# Patient Record
Sex: Female | Born: 1960
Health system: Southern US, Community
[De-identification: ages and names within clinical notes are randomized; demographics above are authoritative.]

## PROBLEM LIST (undated history)

## (undated) DIAGNOSIS — F419 Anxiety disorder, unspecified: Secondary | ICD-10-CM

## (undated) DIAGNOSIS — F32A Depression, unspecified: Secondary | ICD-10-CM

## (undated) DIAGNOSIS — O021 Missed abortion: Secondary | ICD-10-CM

## (undated) DIAGNOSIS — R0600 Dyspnea, unspecified: Secondary | ICD-10-CM

## (undated) DIAGNOSIS — E785 Hyperlipidemia, unspecified: Secondary | ICD-10-CM

## (undated) DIAGNOSIS — Z8489 Family history of other specified conditions: Secondary | ICD-10-CM

## (undated) DIAGNOSIS — T7840XA Allergy, unspecified, initial encounter: Secondary | ICD-10-CM

## (undated) DIAGNOSIS — I10 Essential (primary) hypertension: Secondary | ICD-10-CM

## (undated) DIAGNOSIS — M1990 Unspecified osteoarthritis, unspecified site: Secondary | ICD-10-CM

## (undated) DIAGNOSIS — Z9289 Personal history of other medical treatment: Secondary | ICD-10-CM

## (undated) DIAGNOSIS — D649 Anemia, unspecified: Secondary | ICD-10-CM

## (undated) HISTORY — PX: DILATION AND CURETTAGE, DIAGNOSTIC / THERAPEUTIC: SUR384

## (undated) HISTORY — PX: CERVIX LESION DESTRUCTION: SHX591

## (undated) HISTORY — DX: Depression, unspecified: F32.A

## (undated) HISTORY — DX: Unspecified osteoarthritis, unspecified site: M19.90

## (undated) HISTORY — DX: Allergy, unspecified, initial encounter: T78.40XA

## (undated) HISTORY — PX: TUBAL LIGATION: SHX77

---

## 2014-04-27 ENCOUNTER — Emergency Department (HOSPITAL_COMMUNITY)
Admission: EM | Admit: 2014-04-27 | Discharge: 2014-04-27 | Disposition: A | Discharge status: Home / Self Care | Payer: Self-pay | Attending: Emergency Medicine | Admitting: Emergency Medicine

## 2014-04-27 ENCOUNTER — Emergency Department (HOSPITAL_COMMUNITY): Payer: Self-pay

## 2014-04-27 ENCOUNTER — Encounter (HOSPITAL_COMMUNITY): Payer: Self-pay | Admitting: *Deleted

## 2014-04-27 DIAGNOSIS — I10 Essential (primary) hypertension: Secondary | ICD-10-CM | POA: Insufficient documentation

## 2014-04-27 DIAGNOSIS — R079 Chest pain, unspecified: Secondary | ICD-10-CM | POA: Insufficient documentation

## 2014-04-27 HISTORY — DX: Essential (primary) hypertension: I10

## 2014-04-27 LAB — COMPREHENSIVE METABOLIC PANEL
ALT: 11 U/L (ref 0–35)
ANION GAP: 6 (ref 5–15)
AST: 17 U/L (ref 0–37)
Albumin: 3.4 g/dL — ABNORMAL LOW (ref 3.5–5.2)
Alkaline Phosphatase: 41 U/L (ref 39–117)
BUN: 7 mg/dL (ref 6–23)
CO2: 26 mmol/L (ref 19–32)
Calcium: 8.6 mg/dL (ref 8.4–10.5)
Chloride: 106 mmol/L (ref 96–112)
Creatinine, Ser: 0.65 mg/dL (ref 0.50–1.10)
GFR calc non Af Amer: >90 mL/min (ref >90)
Glucose, Bld: 103 mg/dL — ABNORMAL HIGH (ref 70–99)
POTASSIUM: 3.5 mmol/L (ref 3.5–5.1)
Sodium: 138 mmol/L (ref 135–145)
Total Bilirubin: 0.4 mg/dL (ref 0.3–1.2)
Total Protein: 6.7 g/dL (ref 6.0–8.3)

## 2014-04-27 LAB — POC OCCULT BLOOD, ED: Fecal Occult Bld: NEGATIVE

## 2014-04-27 LAB — CBC
HCT: 25.7 % — ABNORMAL LOW (ref 36.0–46.0)
HEMOGLOBIN: 7.5 g/dL — AB (ref 12.0–15.0)
MCH: 21.4 pg — ABNORMAL LOW (ref 26.0–34.0)
MCHC: 29.2 g/dL — ABNORMAL LOW (ref 30.0–36.0)
MCV: 73.2 fL — AB (ref 78.0–100.0)
Platelets: 317 10*3/uL (ref 150–400)
RBC: 3.51 MIL/uL — AB (ref 3.87–5.11)
RDW: 16.4 % — ABNORMAL HIGH (ref 11.5–15.5)
WBC: 5.2 10*3/uL (ref 4.0–10.5)

## 2014-04-27 LAB — I-STAT TROPONIN, ED
TROPONIN I, POC: 0 ng/mL (ref 0.00–0.08)
Troponin i, poc: 0 ng/mL (ref 0.00–0.08)

## 2014-04-27 MED ORDER — SODIUM CHLORIDE 0.9 % IV BOLUS (SEPSIS)
1000.0000 mL | Freq: Once | INTRAVENOUS | Status: AC
  Administered 2014-04-27: 1000 mL via INTRAVENOUS

## 2014-04-27 MED ORDER — IOHEXOL 350 MG/ML SOLN
80.0000 mL | Freq: Once | INTRAVENOUS | Status: AC | PRN
  Administered 2014-04-27: 66 mL via INTRAVENOUS

## 2014-04-27 NOTE — ED Provider Notes (Addendum)
CSN: [id number]     Arrival date & time 04/27/14  0158 History   First MD Initiated Contact with Patient 04/27/14 0214     This chart was scribed for Nina Balls, MD by Forrestine Him, ED Scribe. This patient was seen in room D35C/D35C and the patient's care was started 2:23 AM.   Chief Complaint  Patient presents with  . Chest Pain    HPI  HPI Comments: Nina Hopkins brought in by EMS is a 54 y.o. female with a PMHx of HTN who presents to the Emergency Department complaining of constant, moderate central chest pain that occasionally radiates to the back and woke her from sleep this evening. Pain is described as "it just hurts". Pt states pain was worsened with deep breathing but says currently pain is only aggravated with palpation. No medication or home remedies attempted prior to arrival. However, pt was given Nitro and Aspirin en route without any improvement for discomfort. Ms. Dombeck also reports intermittent L ankle swelling. No SOB, vomiting, or diaphoresis. No recent long distance travel. No prior history of chest pain. No hormone use at this time. However, she reports a history of blood clots in her legs several years ago. Pt was prescribed every day medication to manage history of HTN 6 months ago. However, she has been without this medication for past 3 months as she has not contacted her PCP for a refill.  Past Medical History  Diagnosis Date  . Hypertension    Past Surgical History  Procedure Laterality Date  . Tubal ligation     History reviewed. No pertinent family history. History  Substance Use Topics  . Smoking status: Never Smoker   . Smokeless tobacco: Not on file  . Alcohol Use: Yes   OB History    No data available     Review of Systems  A complete 10 system review of systems was obtained and all systems are negative except as noted in the HPI and PMH.    Allergies  Prilosec  Home Medications   Prior to Admission medications   Not on File    Triage Vitals: BP 144/89 mmHg  Pulse 76  Temp(Src) 98.4 F (36.9 C) (Oral)  Resp 16  Ht 5\' 1"  (1.549 m)  Wt 172 lb (78.019 kg)  BMI 32.52 kg/m2  SpO2 100%  LMP 04/18/2014   Physical Exam  Constitutional: She is oriented to person, place, and time. She appears well-developed and well-nourished. No distress.  HENT:  Head: Normocephalic and atraumatic.  Eyes: EOM are normal.  Neck: Normal range of motion.  Cardiovascular: Normal rate, regular rhythm and normal heart sounds.   Pulmonary/Chest: Effort normal and breath sounds normal.  Abdominal: Soft. She exhibits no distension. There is no tenderness.  Musculoskeletal: Normal range of motion.  Neurological: She is alert and oriented to person, place, and time.  Skin: Skin is warm and dry.  Psychiatric: She has a normal mood and affect. Judgment normal.  Nursing note and vitals reviewed.   ED Course  Procedures (including critical care time)  DIAGNOSTIC STUDIES: Oxygen Saturation is 100% on RA, Normal by my interpretation.    COORDINATION OF CARE: 2:37 AM-Discussed treatment plan with pt at bedside and pt agreed to plan.  a   Labs Review Labs Reviewed  CBC - Abnormal; Notable for the following:    RBC 3.51 (*)    Hemoglobin 7.5 (*)    HCT 25.7 (*)    MCV 73.2 (*)  MCH 21.4 (*)    MCHC 29.2 (*)    RDW 16.4 (*)    All other components within normal limits  COMPREHENSIVE METABOLIC PANEL - Abnormal; Notable for the following:    Glucose, Bld 103 (*)    Albumin 3.4 (*)    All other components within normal limits  I-STAT TROPOININ, ED  POC OCCULT BLOOD, ED  I-STAT TROPOININ, ED    Imaging Review Ct Angio Chest Pe W/cm &/or Wo Cm  04/27/2014   CLINICAL DATA:  Central chest pain radiating into the back. Chest pain awoke patient from sleep.  EXAM: CT ANGIOGRAPHY CHEST WITH CONTRAST  TECHNIQUE: Multidetector CT imaging of the chest was performed using the standard protocol during bolus administration of intravenous  contrast. Multiplanar CT image reconstructions and MIPs were obtained to evaluate the vascular anatomy.  CONTRAST:  18mL OMNIPAQUE IOHEXOL 350 MG/ML SOLN  COMPARISON:  None.  FINDINGS: There are no filling defects within the pulmonary arteries to suggest pulmonary embolus.  Mild ectasia of the ascending thoracic aorta without aneurysm. There is tortuosity of the descending aorta. Minimal atherosclerosis of the aortic arch. Mild multi chamber cardiomegaly. No significant coronary artery calcifications. No pleural or pericardial effusion. No mediastinal or hilar adenopathy.  Minimal linear atelectasis in the right middle lobe. No consolidation, pulmonary nodule or mass.  No acute abnormality in the upper abdomen. There is a 12 mm hypodensity in the subcapsular right hepatic lobe, incompletely characterized.  There is mild degenerative change in the mid thoracic spine. No acute or suspicious osseous abnormality.  Review of the MIP images confirms the above findings.  IMPRESSION: 1. No pulmonary embolus. 2. Mild cardiomegaly. Mild tortuosity of the thoracic aorta without aneurysm. 3. No acute intrathoracic process.   Electronically Signed   By: Jeb Levering M.D.   On: 04/27/2014 03:26     EKG Interpretation   Date/Time:  Thursday April 27 2014 02:03:20 EDT Ventricular Rate:  78 PR Interval:  179 QRS Duration: 86 QT Interval:  387 QTC Calculation: 441 R Axis:   26 Text Interpretation:  Sinus rhythm Borderline repolarization abnormality  poor baseline Confirmed by Glynn Octave [phone]) on 04/27/2014  2:49:14 AM      MDM   Final diagnoses:  None    Patient presents for sudden onset CP that woke her out of sleep.  I have concern for PE as her pain is pleuritic and patient has a h/o DVT in the past.    CT is neg for PE.  ACS is low on DDx but will still evaluate as she has risk factors.  HEART score is 3, and 2 sets of trop are neg.  Her Hgb is 7.5 but I do not think anemia is a cause  for her chest pain. She recently went off of her cycle and states she has had anemia for quite some time.  She has been off of her iron pills for a number of years, thus she probably lives in this range.  Hemocult is negative.  Patient has been monitored all night and her VS remain within her normal limits, she is safe for DC with PCP fu within 3 days.    I personally performed the services described in this documentation, which was scribed in my presence. The recorded information has been reviewed and is accurate.   Nina Balls, MD 04/27/14 7425  Nina Balls, MD 04/27/14 9563

## 2014-04-27 NOTE — ED Notes (Signed)
Dr. Claudine Mouton at the bedside to update the patient.

## 2014-04-27 NOTE — ED Notes (Signed)
Pt c/o central chest pain; unable to describe pain. Pain is reproduced on palpation. Reports being awaken by pain. Nitro was given by EMS without relief. Denies recent travel; pain worsens on inspiration.

## 2014-04-27 NOTE — ED Notes (Signed)
Pt to ED from home via [facility] c/o central chest pain radiating iinto back. Chest pain woke pt from sleep. Pain increases on inspiration and is reproducable on palpation. EMS gave 324mg  ASA and nitro x 2 without relief. EMS vs 141/95; hr 83; oxygen sat 98% on room air

## 2014-04-27 NOTE — Discharge Instructions (Signed)
Chest Pain (Nonspecific) Nina Hopkins, your CT scan does not show a blood clot and your EKG and blood work were negative 2 times for a heart attack.  Follow up with a primary physician within 3 days for continued care.  If symptoms worsen, come back to the ED immediately.  Thank you. It is often hard to give a diagnosis for the cause of chest pain. There is always a chance that your pain could be related to something serious, such as a heart attack or a blood clot in the lungs. You need to follow up with your doctor. HOME CARE  If antibiotic medicine was given, take it as directed by your doctor. Finish the medicine even if you start to feel better.  For the next few days, avoid activities that bring on chest pain. Continue physical activities as told by your doctor.  Do not use any tobacco products. This includes cigarettes, chewing tobacco, and e-cigarettes.  Avoid drinking alcohol.  Only take medicine as told by your doctor.  Follow your doctor's suggestions for more testing if your chest pain does not go away.  Keep all doctor visits you made. GET HELP IF:  Your chest pain does not go away, even after treatment.  You have a rash with blisters on your chest.  You have a fever. GET HELP RIGHT AWAY IF:   You have more pain or pain that spreads to your arm, neck, jaw, back, or belly (abdomen).  You have shortness of breath.  You cough more than usual or cough up blood.  You have very bad back or belly pain.  You feel sick to your stomach (nauseous) or throw up (vomit).  You have very bad weakness.  You pass out (faint).  You have chills. This is an emergency. Do not wait to see if the problems will go away. Call your local emergency services (911 in [place] Do not drive yourself to the hospital. MAKE SURE YOU:   Understand these instructions.  Will watch your condition.  Will get help right away if you are not doing well or get worse. Document Released: 07/09/2007  Document Revised: 01/25/2013 Document Reviewed: 07/09/2007 [facility] Patient Information 2015 [place], [place]. This information is not intended to replace advice given to you by your health care provider. Make sure you discuss any questions you have with your health care provider.

## 2014-07-09 ENCOUNTER — Emergency Department (HOSPITAL_COMMUNITY)
Admission: EM | Admit: 2014-07-09 | Discharge: 2014-07-09 | Disposition: A | Discharge status: Home / Self Care | Payer: Self-pay | Attending: Emergency Medicine | Admitting: Emergency Medicine

## 2014-07-09 ENCOUNTER — Encounter (HOSPITAL_COMMUNITY): Payer: Self-pay | Admitting: *Deleted

## 2014-07-09 ENCOUNTER — Emergency Department (HOSPITAL_COMMUNITY): Payer: Self-pay

## 2014-07-09 DIAGNOSIS — J209 Acute bronchitis, unspecified: Secondary | ICD-10-CM | POA: Insufficient documentation

## 2014-07-09 DIAGNOSIS — R590 Localized enlarged lymph nodes: Secondary | ICD-10-CM | POA: Insufficient documentation

## 2014-07-09 DIAGNOSIS — J40 Bronchitis, not specified as acute or chronic: Secondary | ICD-10-CM

## 2014-07-09 DIAGNOSIS — I10 Essential (primary) hypertension: Secondary | ICD-10-CM | POA: Insufficient documentation

## 2014-07-09 MED ORDER — AZITHROMYCIN 250 MG PO TABS
250.0000 mg | ORAL_TABLET | Freq: Every day | ORAL | Status: DC
Start: 2014-07-09 — End: 2014-12-21

## 2014-07-09 MED ORDER — ALBUTEROL SULFATE HFA 108 (90 BASE) MCG/ACT IN AERS
2.0000 | INHALATION_SPRAY | Freq: Once | RESPIRATORY_TRACT | Status: AC
  Administered 2014-07-09: 2 via RESPIRATORY_TRACT
  Filled 2014-07-09: qty 6.7

## 2014-07-09 MED ORDER — HYDROCHLOROTHIAZIDE 25 MG PO TABS: 25.0000 mg | ORAL_TABLET | Freq: Every day | ORAL | Status: DC

## 2014-07-09 NOTE — ED Provider Notes (Signed)
CSN: [id number]     Arrival date & time 07/09/14  1027 History  This chart was scribed for non-physician practitioner, Al Corpus, PA-C, working with Lacretia Leigh, MD, by Stephania Fragmin, ED Scribe. This patient was seen in room TR09C/TR09C and the patient's care was started at 12:12 PM.    Chief Complaint  Patient presents with  . Cough   The history is provided by the patient. No language interpreter was used.   HPI Comments: Nina Hopkins is a 54 y.o. female who presents to the Emergency Department complaining of a constant, severe cough that was initially productive with thick green sputum but recently thinned out to yellow-green sputum, with onset 5 days ago. She states she also had fever of 101 and generalized myalgias for the first two days that her symptoms began, but they resolved 3 days ago. She complains of associated rib pain secondary to her cough, exacerbated by deep inspiration. No exertional symptoms. She tried taking Tylenol and ibuprofen the first 2 days for her myalgias, which provided temporary relief. She hasn't taken anything else for her symptoms, as she has a history of hypertension and wasn't sure what OTC medication was safe to take. She denies a history of asthma or COPD. She also denies a history of smoking. She denies nausea or vomiting.   Past Medical History  Diagnosis Date  . Hypertension    Past Surgical History  Procedure Laterality Date  . Tubal ligation     No family history on file. History  Substance Use Topics  . Smoking status: Never Smoker   . Smokeless tobacco: Not on file  . Alcohol Use: Yes   OB History    No data available     Review of Systems  Respiratory: Positive for cough. Negative for wheezing.   Cardiovascular: Positive for chest pain.  Gastrointestinal: Negative for nausea and vomiting.  Musculoskeletal: Positive for myalgias. Negative for gait problem.    Allergies  Prilosec  Home Medications   Prior to Admission  medications   Medication Sig Start Date End Date Taking? Authorizing Provider  azithromycin (ZITHROMAX) 250 MG tablet Take 1 tablet (250 mg total) by mouth daily. Take first 2 tablets together, then 1 every day until finished. 07/09/14   Al Corpus, PA-C  hydrochlorothiazide (HYDRODIURIL) 25 MG tablet Take 1 tablet (25 mg total) by mouth daily. 07/09/14   Al Corpus, PA-C   BP 173/110 mmHg  Pulse 99  Temp(Src) 99.3 F (37.4 C) (Oral)  Resp 12  SpO2 100%  LMP 07/06/2014 Physical Exam  Constitutional: She appears well-developed and well-nourished. No distress.  HENT:  Head: Normocephalic and atraumatic.  Nose: Right sinus exhibits no maxillary sinus tenderness and no frontal sinus tenderness. Left sinus exhibits no maxillary sinus tenderness and no frontal sinus tenderness.  Mouth/Throat: Mucous membranes are normal. Posterior oropharyngeal edema and posterior oropharyngeal erythema present. No oropharyngeal exudate.  Oropharyngeal swelling, but no significant exudates.   Eyes: Conjunctivae and EOM are normal. Right eye exhibits no discharge. Left eye exhibits no discharge.  Neck: Normal range of motion. Neck supple.  Cardiovascular: Normal rate, regular rhythm and normal heart sounds.   Pulmonary/Chest: Effort normal and breath sounds normal. No respiratory distress. She has no wheezes. She has no rales.  Lungs are clear to auscultation.   Abdominal: Soft. Bowel sounds are normal. She exhibits no distension. There is no tenderness.  Lymphadenopathy:    She has cervical adenopathy.  Neurological: She is alert.  Skin: Skin is warm  and dry. She is not diaphoretic.  Nursing note and vitals reviewed.   ED Course  Procedures (including critical care time)  DIAGNOSTIC STUDIES: Oxygen Saturation is 99% on RA, normal by my interpretation.    COORDINATION OF CARE: 12:16 PM - Discussed treatment plan with pt at bedside which includes CXR and inhaler, and pt agreed to plan.  Imaging  Review Dg Chest 2 View  07/09/2014   CLINICAL DATA:  Cough for 5 days, hypertension  EXAM: CHEST  2 VIEW  COMPARISON:  None; correlation CT chest 04/27/2014  FINDINGS: Upper normal heart size.  Mediastinal contours and pulmonary vascularity normal.  Minimal peribronchial thickening.  Lungs clear.  No pleural effusion or pneumothorax.  Osseous structures unremarkable.  IMPRESSION: Minimal bronchitic changes without infiltrate.   Electronically Signed   By: Lavonia Dana M.D.   On: 07/09/2014 13:44     MDM   Final diagnoses:  Essential hypertension  Bronchitis   Pt presenting with cough with fevers. CXR negative for PNA. Bronchitis changes. Will treat with albuterol inhaler, azithromycin. Pt in no respiratory distress. Pt with hypertension without exertional chest pain, headache visual changes, hematuria. Pt did not take her hypertension medications. Will restart HCTZ which she has taken in the past. Review of basic labs in March with good kidney function. Pt to take when she gets home and follow up with the wellness center or PCP of her choice. ED resources provided. No sign of hypertensive urgency.   Discussed return precautions with patient. Discussed all results and patient verbalizes understanding and agrees with plan.  I personally performed the services described in this documentation, which was scribed in my presence. The recorded information has been reviewed and is accurate.   Al Corpus, PA-C 07/09/14 1457  Lacretia Leigh, MD 07/11/14 1135

## 2014-07-09 NOTE — ED Notes (Signed)
Declined W/C at D/C and was escorted to lobby by RN. 

## 2014-07-09 NOTE — Discharge Instructions (Signed)
Return to the emergency room with worsening of symptoms, new symptoms or with symptoms that are concerning , especially fevers, stiff neck, worsening headache, nausea/vomiting, visual changes or slurred speech, chest pain, shortness of breath, cough with thick colored mucous or blood OR headache, visual changes, blood in urine.  Start taking claritin, zyrtec or allegra. Please take all of your antibiotics until finished!   You may develop abdominal discomfort or diarrhea from the antibiotic.  You may help offset this with probiotics which you can buy or get in yogurt. Do not eat  or take the probiotics until 2 hours after your antibiotic.  Please call your doctor for a followup appointment within 24-48 hours. When you talk to your doctor please let them know that you were seen in the emergency department and have them acquire all of your records so that they can discuss the findings with you and formulate a treatment plan to fully care for your new and ongoing problems. If you do not have a primary care provider please call the number below under ED resources to establish care with a provider and follow up.    Emergency Department Resource Guide 1) Find a Doctor and Pay Out of Pocket Although you won't have to find out who is covered by your insurance plan, it is a good idea to ask around and get recommendations. You will then need to call the office and see if the doctor you have chosen will accept you as a new patient and what types of options they offer for patients who are self-pay. Some doctors offer discounts or will set up payment plans for their patients who do not have insurance, but you will need to ask so you aren't surprised when you get to your appointment.  2) Contact Your Local Health Department Not all health departments have doctors that can see patients for sick visits, but many do, so it is worth a call to see if yours does. If you don't know where your local health department is, you  can check in your phone book. The CDC also has a tool to help you locate your state's health department, and many state websites also have listings of all of their local health departments.  3) Find a [facility] If your illness is not likely to be very severe or complicated, you may want to try a walk in clinic. These are popping up all over the country in pharmacies, drugstores, and shopping centers. They're usually staffed by nurse practitioners or physician assistants that have been trained to treat common illnesses and complaints. They're usually fairly quick and inexpensive. However, if you have serious medical issues or chronic medical problems, these are probably not your best option.  No Primary Care Doctor: - Call Health Connect at  [phone] - they can help you locate a primary care doctor that  accepts your insurance, provides certain services, etc. - Physician Referral Service- [phone]  Chronic Pain Problems: Organization         Address  Phone   Notes  [facility]  [phone] Patients need to be referred by their primary care doctor.   Medication Assistance: Organization         Address  Phone   Notes  [facility] Medication [facility] [address] [place], [place] [address] [phone] --Must be a resident of [facility] -- Must have NO insurance coverage whatsoever (no Medicaid/ Medicare, etc.) -- The pt. MUST have  a primary care doctor that directs their care regularly and follows them in the community   MedAssist  [phone]   [employer]  [phone]    Agencies that provide inexpensive medical care: Organization         Address  Phone   Notes  [place]  [phone]   [facility]    [phone]   [facility] [place], [place] [address] [phone]   [place] [address], [place] [phone]   Planned Parenthood    [phone]   [facility]    [phone]   [place] and [place] [address], [place] Phone:  [phone], Fax:  [phone] Hours of Operation:  9 am - 6 pm, M-F.  Also accepts Medicaid/Medicare and self-pay.  [facility] for [address], [place] Phone: [phone], Fax: [phone]. Hours of Operation:  8:30 am - 5:30 pm, M-F.  Also accepts Medicaid and self-pay.  [facility] [place] [address] [place] Phone: [phone]   [place], [place], [place] [phone], Ext. [phone] Mondays & Thursdays: 7-9 AM.  First 15 patients are seen on a first come, first serve basis.    [place] Providers:  Organization         Address  Phone   Notes  [facility] [address], [place] [phone] Also accepts self-pay patients.  [facility] [address], [place]  [phone]   [place], [address], [place] [phone]   [facility] [address] [place] [phone]   Lucianne Lei [address], [place]   [phone] Only accepts [place] Access [place] patients after they have their name applied to their card.   Self-Pay (no insurance) in [facility]:  Organization         Address  Phone   Notes  Sickle Cell Patients, [facility] [place] [phone]   [facility] Urgent Care [place] [phone]   [facility] [place]  [place], [place], [place] [phone]   [facility]/Dr. Osei-Bonsu  [address], [place] or [address], [place] [phone] Phone number for both [place] and [place] locations  is the same.  Urgent Medical and [facility] [address], [place] [phone]   [facility] [address], [place] or [address] [phone]   [facility] [place] [address] [place] [phone], phone; [phone], fax Sees patients 1st and 3rd Saturday of every month.  Must not qualify for public or private insurance (i.e. Medicaid, Medicare, [place] Health Choice, Veterans' Benefits)  Household income should be no more than 200% of the poverty level The clinic cannot treat you if you are pregnant or think you are pregnant  Sexually transmitted diseases are not treated at the clinic.    Dental Care: Organization         Address  Phone  Notes  [facility] [address] [place], [place] (  [phone] Accepts children up to age 52 who are enrolled in Medicaid or [place]; pregnant women with a Medicaid card; and children who have applied for Medicaid or [place] Health Choice, but were declined, whose parents can pay a reduced fee at time of service.  [facility] Department of [facility]  [address], [place] [phone] Accepts children up to age 15 who are enrolled in [place] or [place]; pregnant women with a Medicaid card; and children who have applied for Medicaid or [place] Health Choice, but were declined, whose parents can pay a reduced fee at time of service.  [place] Adult Dental Access PROGRAM  [place] [phone] Patients are seen by appointment only. Walk-ins are not accepted. [place] will see patients 83 years of age and older. Monday - Tuesday (8am-5pm) Most Wednesdays (8:30-5pm) $30 per visit, cash only  [facility] Adult Dental Access PROGRAM  [address], [facility] [phone] Patients are seen by appointment only. Walk-ins are not accepted. [place] will see  patients 47 years of age and older. One Wednesday Evening (Monthly: Volunteer Based).  $30 per visit, cash only  [place]  [phone] for adults; Children under age 69, call Graduate Pediatric Dentistry at [phone]. Children aged 85-14, please call [phone] to request a pediatric application.  Dental services are provided in all areas of dental care including fillings, crowns and bridges, complete and partial dentures, implants, gum treatment, root canals, and extractions. Preventive care is also provided. Treatment is provided to both adults and children. Patients are selected via a lottery and there is often a waiting list.   [facility] [address], [place]  [phone] [email or web address]   [facility] [address] [place], [place] [phone], Ext. [phone] Second and Fourth Thursday of each month, opens at 6:30 AM; Clinic ends at 9 AM.  Patients are seen on a first-come first-served basis, and a limited number are seen during each clinic.   [facility]  [address] [place], [place] [phone]   Eligibility Requirements You must have lived in [place], [place], or [place] counties for at least the last three months.   You cannot be eligible for state or federal sponsored [employer], including [employer], [place], or [employer].   You generally cannot be eligible for healthcare insurance through your employer.    How to apply: Eligibility screenings are held every Tuesday and Wednesday afternoon from 1:00 pm until 4:00 pm. You do not need an appointment for the interview!  [facility] [address] [place], [place]   [place]  [place] Department  [place]  [phone]    Behavioral Health Resources in the Community: Intensive Outpatient  Programs Organization         Address  Phone  Notes  [place]. [address], [place], [place] [phone]   [facility] Outpatient [address], [place], [place]   ADS: Alcohol & Drug Svcs [address] [place], [place]   [place] [address],  [place], [place] or [phone]   Substance Abuse Resources Organization         Address  Phone  Notes  Alcohol and Drug Services  [place]  [phone]   [place]   Chinita Pester  [phone]   Residential & Outpatient Substance Abuse Program  [phone]   Psychological Services Organization         Address  Phone  Notes  [place]  [place]  [phone]   [place] [address], [place] or [phone]    Mobile Crisis Teams Organization         Address  Phone  Notes  [facility], Mobile Crisis Care Unit  [phone]   [facility] Psychotherapeutic Services  [address] [place], [place]   [place] [address], [place] [phone]    Self-Help/Support Groups Organization         Address  Phone             Notes  [place]. of [place] - variety of support groups  [place] Call for more information  Narcotics Anonymous (NA), [facility] [address], [employer] [place]  2 meetings at this location   Special educational needs teacher         Address  Phone  Notes  [facility] Residential Treatment [place],    [place]  [phone]   [facility]  [address], [place] [phone], [employer], [place]   [place], [place] [phone] Admissions: 8am-3pm M-F  Incentives Substance [place] [address],    [place], [place]  [phone]   [facility] [address] [place], [place], [place]   [facility] [address]  [place], [place]   [facility]ight Programs - Intensive Outpatient [address], [place], [place]   [facility] ([place].) [address].,  [place], [place] [phone] or [phone]   [facility] Services (RTS) [address], [place], [place] Accepts Medicaid  [facility] [place] [address],  [place] [phone] Substance Abuse/Addiction Treatment   [facility] Organization         Address  Phone  Notes  [facility]  [phone]   Domenic Schwab, PhD [address]lis [place], [place]   [phone] or [phone]   [place], [place] [phone]   [facility] [address], [place], [place] [phone] Insurance/Medicaid/sponsorship through [facility] and Families [address] [place]                                    [place], [place] [phone] [place] [address] [place], [place] [phone]    Dr. Adele Schilder  [phone]   [facility] Dept. 1) [address] [place] 2) [place] 3)  [place] [address], [place] [phone]  [phone]   [place] [phone] or [phone] (After Hours)

## 2014-07-09 NOTE — ED Notes (Addendum)
Pt states that she has had a cough and cold symptoms with body aches and soreness. Pt states that she had fever on Tuesday and wednesday but none since. Hx of hypertension not currently taking meds due to insurance.

## 2014-08-11 ENCOUNTER — Ambulatory Visit: Payer: Self-pay

## 2014-12-20 ENCOUNTER — Encounter (HOSPITAL_COMMUNITY): Payer: Self-pay | Admitting: Emergency Medicine

## 2014-12-20 ENCOUNTER — Emergency Department (HOSPITAL_COMMUNITY): Payer: Self-pay

## 2014-12-20 ENCOUNTER — Observation Stay (HOSPITAL_COMMUNITY)
Admission: EM | Admit: 2014-12-20 | Discharge: 2014-12-21 | Disposition: A | Discharge status: Home / Self Care | Payer: Self-pay | Attending: Internal Medicine | Admitting: Internal Medicine

## 2014-12-20 DIAGNOSIS — N921 Excessive and frequent menstruation with irregular cycle: Secondary | ICD-10-CM

## 2014-12-20 DIAGNOSIS — R0600 Dyspnea, unspecified: Secondary | ICD-10-CM | POA: Diagnosis present

## 2014-12-20 DIAGNOSIS — R0602 Shortness of breath: Secondary | ICD-10-CM | POA: Insufficient documentation

## 2014-12-20 DIAGNOSIS — I10 Essential (primary) hypertension: Secondary | ICD-10-CM | POA: Insufficient documentation

## 2014-12-20 DIAGNOSIS — Z9289 Personal history of other medical treatment: Secondary | ICD-10-CM

## 2014-12-20 DIAGNOSIS — D500 Iron deficiency anemia secondary to blood loss (chronic): Principal | ICD-10-CM | POA: Insufficient documentation

## 2014-12-20 DIAGNOSIS — M546 Pain in thoracic spine: Secondary | ICD-10-CM | POA: Diagnosis present

## 2014-12-20 DIAGNOSIS — R0789 Other chest pain: Secondary | ICD-10-CM | POA: Insufficient documentation

## 2014-12-20 DIAGNOSIS — D649 Anemia, unspecified: Secondary | ICD-10-CM | POA: Diagnosis present

## 2014-12-20 DIAGNOSIS — N920 Excessive and frequent menstruation with regular cycle: Secondary | ICD-10-CM | POA: Diagnosis present

## 2014-12-20 DIAGNOSIS — Z888 Allergy status to other drugs, medicaments and biological substances status: Secondary | ICD-10-CM | POA: Insufficient documentation

## 2014-12-20 DIAGNOSIS — R079 Chest pain, unspecified: Secondary | ICD-10-CM

## 2014-12-20 DIAGNOSIS — N939 Abnormal uterine and vaginal bleeding, unspecified: Secondary | ICD-10-CM | POA: Diagnosis present

## 2014-12-20 HISTORY — DX: Anxiety disorder, unspecified: F41.9

## 2014-12-20 HISTORY — DX: Anemia, unspecified: D64.9

## 2014-12-20 HISTORY — DX: Personal history of other medical treatment: Z92.89

## 2014-12-20 HISTORY — DX: Family history of other specified conditions: Z84.89

## 2014-12-20 LAB — CBC
HEMATOCRIT: 21.7 % — AB (ref 36.0–46.0)
HEMOGLOBIN: 6 g/dL — AB (ref 12.0–15.0)
MCH: 18.8 pg — ABNORMAL LOW (ref 26.0–34.0)
MCHC: 27.6 g/dL — ABNORMAL LOW (ref 30.0–36.0)
MCV: 67.8 fL — AB (ref 78.0–100.0)
PLATELETS: 333 10*3/uL (ref 150–400)
RBC: 3.2 MIL/uL — AB (ref 3.87–5.11)
RDW: 17.2 % — ABNORMAL HIGH (ref 11.5–15.5)
WBC: 4.7 10*3/uL (ref 4.0–10.5)

## 2014-12-20 LAB — BASIC METABOLIC PANEL
Anion gap: 7 (ref 5–15)
BUN: 7 mg/dL (ref 6–20)
CHLORIDE: 107 mmol/L (ref 101–111)
CO2: 22 mmol/L (ref 22–32)
Calcium: 9 mg/dL (ref 8.9–10.3)
Creatinine, Ser: 0.68 mg/dL (ref 0.44–1.00)
GFR calc non Af Amer: >60 mL/min (ref >60)
Glucose, Bld: 93 mg/dL (ref 65–99)
POTASSIUM: 4.1 mmol/L (ref 3.5–5.1)
Sodium: 136 mmol/L (ref 135–145)

## 2014-12-20 LAB — FOLATE: Folate: 15.9 ng/mL (ref >5.9)

## 2014-12-20 LAB — PROTIME-INR
INR: 1.35 (ref 0.00–1.49)
Prothrombin Time: 16.8 seconds — ABNORMAL HIGH (ref 11.6–15.2)

## 2014-12-20 LAB — RETICULOCYTES
RBC.: 3.41 MIL/uL — ABNORMAL LOW (ref 3.87–5.11)
RETIC CT PCT: 1.4 % (ref 0.4–3.1)
Retic Count, Absolute: 47.7 10*3/uL (ref 19.0–186.0)

## 2014-12-20 LAB — IRON AND TIBC
IRON: 9 ug/dL — AB (ref 28–170)
SATURATION RATIOS: 2 % — AB (ref 10.4–31.8)
TIBC: 540 ug/dL — AB (ref 250–450)
UIBC: 531 ug/dL

## 2014-12-20 LAB — ABO/RH: ABO/RH(D): O POS

## 2014-12-20 LAB — TROPONIN I: Troponin I: <0.03 ng/mL (ref <0.031)

## 2014-12-20 LAB — I-STAT TROPONIN, ED: TROPONIN I, POC: 0 ng/mL (ref 0.00–0.08)

## 2014-12-20 LAB — VITAMIN B12: VITAMIN B 12: 438 pg/mL (ref 180–914)

## 2014-12-20 LAB — FERRITIN: FERRITIN: 2 ng/mL — AB (ref 11–307)

## 2014-12-20 LAB — PREPARE RBC (CROSSMATCH)

## 2014-12-20 MED ORDER — HYDROMORPHONE HCL 1 MG/ML IJ SOLN: 1.0000 mg | INTRAMUSCULAR | Status: DC | PRN

## 2014-12-20 MED ORDER — ACETAMINOPHEN 650 MG RE SUPP: 650.0000 mg | Freq: Four times a day (QID) | RECTAL | Status: DC | PRN

## 2014-12-20 MED ORDER — MORPHINE SULFATE (PF) 4 MG/ML IV SOLN
4.0000 mg | Freq: Once | INTRAVENOUS | Status: AC
  Administered 2014-12-20: 4 mg via INTRAVENOUS
  Filled 2014-12-20: qty 1

## 2014-12-20 MED ORDER — ACETAMINOPHEN 325 MG PO TABS
650.0000 mg | ORAL_TABLET | Freq: Four times a day (QID) | ORAL | Status: DC | PRN
  Administered 2014-12-21: 650 mg via ORAL
  Filled 2014-12-20: qty 2

## 2014-12-20 MED ORDER — FERROUS GLUCONATE 324 (38 FE) MG PO TABS
324.0000 mg | ORAL_TABLET | Freq: Three times a day (TID) | ORAL | Status: DC
  Administered 2014-12-20 – 2014-12-21 (×3): 324 mg via ORAL
  Filled 2014-12-20 (×3): qty 1

## 2014-12-20 MED ORDER — ONDANSETRON HCL 4 MG/2ML IJ SOLN
4.0000 mg | Freq: Once | INTRAMUSCULAR | Status: AC
  Administered 2014-12-20: 4 mg via INTRAVENOUS
  Filled 2014-12-20: qty 2

## 2014-12-20 MED ORDER — ENOXAPARIN SODIUM 40 MG/0.4ML ~~LOC~~ SOLN
40.0000 mg | SUBCUTANEOUS | Status: DC
  Administered 2014-12-20: 40 mg via SUBCUTANEOUS
  Filled 2014-12-20: qty 0.4

## 2014-12-20 MED ORDER — ONDANSETRON HCL 4 MG/2ML IJ SOLN: 4.0000 mg | Freq: Four times a day (QID) | INTRAMUSCULAR | Status: DC | PRN

## 2014-12-20 MED ORDER — ONDANSETRON HCL 4 MG/2ML IJ SOLN: 4.0000 mg | Freq: Three times a day (TID) | INTRAMUSCULAR | Status: DC | PRN

## 2014-12-20 MED ORDER — HYDROCHLOROTHIAZIDE 25 MG PO TABS
25.0000 mg | ORAL_TABLET | Freq: Every day | ORAL | Status: DC
  Administered 2014-12-20 – 2014-12-21 (×2): 25 mg via ORAL
  Filled 2014-12-20 (×3): qty 1

## 2014-12-20 MED ORDER — SODIUM CHLORIDE 0.9 % IV SOLN
Freq: Once | INTRAVENOUS | Status: AC
  Administered 2014-12-20: 16:00:00 via INTRAVENOUS

## 2014-12-20 MED ORDER — ONDANSETRON HCL 4 MG PO TABS: 4.0000 mg | ORAL_TABLET | Freq: Four times a day (QID) | ORAL | Status: DC | PRN

## 2014-12-20 MED ORDER — ALUM & MAG HYDROXIDE-SIMETH 200-200-20 MG/5ML PO SUSP: 30.0000 mL | Freq: Four times a day (QID) | ORAL | Status: DC | PRN

## 2014-12-20 MED ORDER — SENNOSIDES-DOCUSATE SODIUM 8.6-50 MG PO TABS
1.0000 | ORAL_TABLET | Freq: Every evening | ORAL | Status: DC | PRN
Start: 2014-12-20 — End: 2014-12-21

## 2014-12-20 NOTE — Discharge Planning (Signed)
Spoke to patient regarding primary care resources and the [facility] orange card. Follow up appointment made with [facility] for Monday Dec 5,2016 at 2:00pm, pt verbalized understanding of the upcoming appointment. Orange card application provided and explained, pt instructed to contact me once application is complete. My contact information also provided for any future questions or concerns. No other [place] Specialist needs identified at this time.   [place] Specialist Partnership for [facility] [phone]

## 2014-12-20 NOTE — H&P (Signed)
[facility] History and Physical  Nina Hopkins [id number] DOB: 1960-07-13 DOA: 12/20/2014  Referring physician: [email or web address] PCP: No primary care provider on file.   Chief Complaint: sob back pain  HPI: Nina Hopkins is a very pleasant 54 y.o. female with a past medical history that includes hypertension presents to the emergency department with the chief complaint of shortness of breath and mid thoracic back pain. Initial evaluation reveals a hemoglobin of 6.0 and hypertension.  Patient reports gradual worsening of shortness of breath with exertion over the last couple of months. Last 2 days has progressed to shortness of breath at rest. Associated symptoms include some midthoracic back pain. She describes the pain as sharp radiating to her shoulder. She says movement makes it worse and lying still makes it better. She denies headache dizziness syncope or near-syncope. She denies cough chest pain palpitations abdominal pain nausea vomiting. She denies any diaphoresis dysuria hematuria frequency or urgency. She denies any constipation diarrhea melena or hematochezia. He denies any history of DVT or PE. She denies any recent travel or lower extremity swelling or pain. She does report having heavy menses for the last 6-8 months. She reports having a heavy period every month and then about half of the time she'll have an extra full cycle. Ports going through 8-10 pads per day during her menses. She denies abdominal cramping but does endorse a few clots. No aspirin or NSAID use  Workup in the emergency department reveals hemoglobin is 6.0 hematocrit 21.7 MCV [id number], basic metabolic panel unremarkable initial troponin negative. Chest x-ray yields no active disease. EKG normal sinus rhythm.  In the emergency department she's afebrile. He is a blood pressure 173/105 heart rate 78 breast Pers 18 she's not hypoxic  She is given 4 mg of morphine and 4 mg of Zofran Review of Systems:  10 point  review of systems complete and all systems are negative except as indicated in the history of present illness Past Medical History  Diagnosis Date  . Hypertension    Past Surgical History  Procedure Laterality Date  . Tubal ligation     Social History:  reports that she has never smoked. She does not have any smokeless tobacco history on file. She reports that she drinks alcohol. She reports that she does not use illicit drugs. She lives at home. She is currently employed in [employer]. She is independent with ADL Allergies  Allergen Reactions  . Prilosec [Omeprazole]     Stiff neck    No family history on file. mother still alive has a history of hypertension she has 3 siblings collective medical history positive for hypertension she is unsure about her father  Prior to Admission medications   Medication Sig Start Date End Date Taking? Authorizing Provider  ibuprofen (ADVIL,MOTRIN) 200 MG tablet Take 400 mg by mouth every 6 (six) hours as needed.   Yes Historical Provider, MD  azithromycin (ZITHROMAX) 250 MG tablet Take 1 tablet (250 mg total) by mouth daily. Take first 2 tablets together, then 1 every day until finished. Patient not taking: Reported on 12/20/2014 07/09/14   Al Corpus, PA-C  hydrochlorothiazide (HYDRODIURIL) 25 MG tablet Take 1 tablet (25 mg total) by mouth daily. 07/09/14   Al Corpus, PA-C   Physical Exam: Filed Vitals:   12/20/14 1300 12/20/14 1315 12/20/14 1345 12/20/14 1415  BP: 167/99 164/99 164/102 185/88  Pulse:  79 73 89  Temp:      TempSrc:      Resp:  13 13 19   Height:      Weight:      SpO2:  100% 100% 100%    Wt Readings from Last 3 Encounters:  12/20/14 74.39 kg (164 lb)  04/27/14 78.019 kg (172 lb)    General:  Appears calm and only slightly uncomfortable Eyes: PERRL, normal lids, irises & conjunctiva ENT: grossly normal hearing, lips & tongue, because membranes of her mouth are moist and pink Neck: no LAD, masses or  thyromegaly Cardiovascular: RRR, no m/r/g. No LE edema. Pulses present and palpable Respiratory: CTA bilaterally, no w/r/r. Normal respiratory effort. Abdomen: soft, ntnd positive bowel sounds throughout no guarding or rebounding Skin: no rash or induration seen on limited exam Musculoskeletal: grossly normal tone BUE/BLE Psychiatric: grossly normal mood and affect, speech fluent and appropriate Neurologic: grossly non-focal. Speech clear facial symmetry           Labs on Admission:  Basic Metabolic Panel:  Recent Labs Lab 12/20/14 1204  NA 136  K 4.1  CL 107  CO2 22  GLUCOSE 93  BUN 7  CREATININE 0.68  CALCIUM 9.0   Liver Function Tests: No results for input(s): AST, ALT, ALKPHOS, BILITOT, PROT, ALBUMIN in the last 168 hours. No results for input(s): LIPASE, AMYLASE in the last 168 hours. No results for input(s): AMMONIA in the last 168 hours. CBC:  Recent Labs Lab 12/20/14 1204  WBC 4.7  HGB 6.0*  HCT 21.7*  MCV 67.8*  PLT 333   Cardiac Enzymes: No results for input(s): CKTOTAL, CKMB, CKMBINDEX, TROPONINI in the last 168 hours.  BNP (last 3 results) No results for input(s): BNP in the last 8760 hours.  ProBNP (last 3 results) No results for input(s): PROBNP in the last 8760 hours.  CBG: No results for input(s): GLUCAP in the last 168 hours.  Radiological Exams on Admission: Dg Chest 2 View  12/20/2014  CLINICAL DATA:  Left-sided chest pain for 2 days, no known injury, initial encounter EXAM: CHEST - 2 VIEW COMPARISON:  07/09/2014 FINDINGS: Cardiac shadow is within normal limits. The lungs are clear bilaterally. No acute fracture is noted. IMPRESSION: No active disease. Electronically Signed   By: Inez Catalina M.D.   On: 12/20/2014 12:51    EKG: Independently reviewed as noted above sinus rhythm  Assessment/Plan Active Problems:   Anemia   Dyspnea   HTN (hypertension)   Back pain   Heavy menses  #1. Anemia microcytic. Likely related to heavy  menses and irregular menses. No report of bright red blood per rectum melena. No NSAID use.  -Admit to telemetry -Transfuse 2 units of packed red blood cells -Obtain an anemia panel -Recheck hemoglobin in the morning -Of note hemoglobin 7.5 in March of this year  #2. Dyspnea at rest and on exertion. Chest x-ray clear. EKG within the limits of normal. Initial troponin negative. Likely related to #1 -We'll cycle troponins for completeness. -Oxygen supplementation as indicated -Monitor oxygen saturation level -Obtain orthostatic vital signs  #3. Back pain. Midthoracic. Reproducible. Initial troponin negative. -We'll cycle troponins for completeness. -Analgesia as needed -Much improved at the time of my exam  #4. Hypertension. Uncontrolled -Home medications include hydrochlorothiazide which she has not taken in almost a year. -Blood pressure at the time of my exam 164/102. - Will resume hydrochlorothiazide and monitor closely -She will likely need an additional agent -She will need close outpatient follow-up. She has an appointment December 5  #5. Heavy menses. She reports a very remote history of concern  about uterine fibroids. She states this was worked up and workup was negative. She has 2 children -Currently she has monthly cycles and some months she has an extra full cycle -Consider Megace -She has PCP appointment on December 5. Will need outpatient referral and CBC     Code Status: full DVT Prophylaxis: Family Communication: none present Disposition Plan: home hopefully 24 hours  Time spent: 48 minutes  [facility]

## 2014-12-20 NOTE — ED Notes (Signed)
C/o SOB, back and CP since last night, no other complaints, Alert, ambulatory and in NAD

## 2014-12-20 NOTE — ED Notes (Signed)
5W still unable to take report. Will give bedside report.

## 2014-12-20 NOTE — ED Notes (Signed)
Attempting report to 5W.

## 2014-12-20 NOTE — ED Provider Notes (Signed)
CSN: [id number]     Arrival date & time 12/20/14  1146 History   First MD Initiated Contact with Patient 12/20/14 1213     Chief Complaint  Patient presents with  . Shortness of Breath  . Back Pain     (Consider location/radiation/quality/duration/timing/severity/associated sxs/prior Treatment) HPI  Blood pressure 164/102, pulse 73, temperature 99.1 F (37.3 C), temperature source Oral, resp. rate 13, height 5\' 1"  (1.549 m), weight 164 lb (74.39 kg), SpO2 100 %.  Nina Hopkins is a 54 y.o. female complaining of left-sided scapular pain radiating to the front exacerbated with movement onset several days ago, the pain kept her from sleep last night. She also reports a shortness of breath onset after the chest pain that is exertional. Patient denies diaphoresis, palpitations, syncope, history of DVT or PE, recent immobilizations, calf pain or leg swelling, exogenous estrogen. Patient is not postmenopausal. States that her periods are very heavy. Sometimes she has them twice a month may last for the full 5-7 days. She doesn't follow with OB/GYN. Is told she had fibroids in the remote past but on subsequent ultrasound they had resolved. Patient denies excessive NSAID use. She takes 2 ibuprofen liquid gels twice a day for the past 2 days due to dental pain. Denies excessive NSAID use, nausea, vomiting, melena, hematochezia, diarrhea, history of GI bleed. She states it's been several months since she's had her blood pressure medications due to lack of insurance and outpatient primary care.  Past Medical History  Diagnosis Date  . Hypertension    Past Surgical History  Procedure Laterality Date  . Tubal ligation     No family history on file. Social History  Substance Use Topics  . Smoking status: Never Smoker   . Smokeless tobacco: None  . Alcohol Use: Yes   OB History    No data available     Review of Systems  10 systems reviewed and found to be negative, except as noted in the  HPI.   Allergies  Prilosec  Home Medications   Prior to Admission medications   Medication Sig Start Date End Date Taking? Authorizing Provider  ibuprofen (ADVIL,MOTRIN) 200 MG tablet Take 400 mg by mouth every 6 (six) hours as needed.   Yes Historical Provider, MD  azithromycin (ZITHROMAX) 250 MG tablet Take 1 tablet (250 mg total) by mouth daily. Take first 2 tablets together, then 1 every day until finished. Patient not taking: Reported on 12/20/2014 07/09/14   Al Corpus, PA-C  hydrochlorothiazide (HYDRODIURIL) 25 MG tablet Take 1 tablet (25 mg total) by mouth daily. 07/09/14   Al Corpus, PA-C   BP 185/88 mmHg  Pulse 89  Temp(Src) 99.1 F (37.3 C) (Oral)  Resp 19  Ht 5\' 1"  (1.549 m)  Wt 164 lb (74.39 kg)  BMI 31.00 kg/m2  SpO2 100% Physical Exam  Constitutional: She is oriented to person, place, and time. She appears well-developed and well-nourished. No distress.  HENT:  Head: Normocephalic and atraumatic.  Mouth/Throat: Oropharynx is clear and moist.  Severe conjunctival pallor  Eyes: Conjunctivae and EOM are normal. Pupils are equal, round, and reactive to light.  Neck: Normal range of motion.  Cardiovascular: Normal rate, regular rhythm and intact distal pulses.   Pulmonary/Chest: Effort normal and breath sounds normal.  Abdominal: Soft. There is no tenderness.  Musculoskeletal: Normal range of motion.  Neurological: She is alert and oriented to person, place, and time.  Skin: She is not diaphoretic.  Psychiatric: She has a normal mood  and affect.  Nursing note and vitals reviewed.   ED Course  Procedures (including critical care time)  CRITICAL CARE Performed by: Monico Blitz   Total critical care time: 35 minutes  Critical care time was exclusive of separately billable procedures and treating other patients.  Critical care was necessary to treat or prevent imminent or life-threatening deterioration.  Critical care was time spent personally  by me on the following activities: development of treatment plan with patient and/or surrogate as well as nursing, discussions with consultants, evaluation of patient's response to treatment, examination of patient, obtaining history from patient or surrogate, ordering and performing treatments and interventions, ordering and review of laboratory studies, ordering and review of radiographic studies, pulse oximetry and re-evaluation of patient's condition.   Labs Review Labs Reviewed  CBC - Abnormal; Notable for the following:    RBC 3.20 (*)    Hemoglobin 6.0 (*)    HCT 21.7 (*)    MCV 67.8 (*)    MCH 18.8 (*)    MCHC 27.6 (*)    RDW 17.2 (*)    All other components within normal limits  BASIC METABOLIC PANEL  VITAMIN 123456  FOLATE  IRON AND TIBC  FERRITIN  RETICULOCYTES  I-STAT TROPOININ, ED  TYPE AND SCREEN  ABO/RH    Imaging Review Dg Chest 2 View  12/20/2014  CLINICAL DATA:  Left-sided chest pain for 2 days, no known injury, initial encounter EXAM: CHEST - 2 VIEW COMPARISON:  07/09/2014 FINDINGS: Cardiac shadow is within normal limits. The lungs are clear bilaterally. No acute fracture is noted. IMPRESSION: No active disease. Electronically Signed   By: Inez Catalina M.D.   On: 12/20/2014 12:51   I have personally reviewed and evaluated these images and lab results as part of my medical decision-making.   EKG Interpretation   Date/Time:  Wednesday December 20 2014 11:57:22 EST Ventricular Rate:  89 PR Interval:  140 QRS Duration: 78 QT Interval:  362 QTC Calculation: 440 R Axis:   17 Text Interpretation:  Normal sinus rhythm Normal ECG T wave changes from  Mar 2016 resolved Confirmed by Regenia Skeeter  MD, SCOTT [phone]) on 12/20/2014  12:25:33 PM      MDM   Final diagnoses:  Anemia, unspecified anemia type  Chest pain, unspecified chest pain type  Uncontrolled hypertension  Menorrhagia with irregular cycle    Filed Vitals:   12/20/14 1300 12/20/14 1315 12/20/14  1345 12/20/14 1415  BP: 167/99 164/99 164/102 185/88  Pulse:  79 73 89  Temp:      TempSrc:      Resp:  13 13 19   Height:      Weight:      SpO2:  100% 100% 100%    Medications  morphine 4 MG/ML injection 4 mg (not administered)  ondansetron (ZOFRAN) injection 4 mg (not administered)  ondansetron (ZOFRAN) injection 4 mg (not administered)  HYDROmorphone (DILAUDID) injection 1 mg (not administered)    Nina Hopkins is 54 y.o. female presenting with positional left-sided chest pain and shortness of breath exacerbated by exertion. I do not believe the 2 are connected. EKG nonischemic, troponin negative. CBC reveals a worsening anemia with hemoglobin of 6. I think this is likely responsible for her dyspnea on exertion. She will need a transfusion, pros and cons discussed. Patient is not tachycardic or hypotensive. She is hemodynamically stable. Anemia panel sent to think this is likely secondary to heavy menses. States that she menstruates for 5-7 days sometimes once a  month but sometimes twice a month. Her blood pressure significantly elevated, she has a history of hypertension but has been unable to afford any hypertensive medications.  This will be an unassigned admission. Case discussed with [facility] AP P Black who accepts admission to a telemetry bed, attending [place].       Monico Blitz, PA-C 12/20/14 [place], PA-C 12/20/14 1439  Sherwood Gambler, MD 12/20/14 1659

## 2014-12-20 NOTE — ED Notes (Signed)
Attempted report to [facility]W. 

## 2014-12-21 DIAGNOSIS — M546 Pain in thoracic spine: Secondary | ICD-10-CM | POA: Diagnosis present

## 2014-12-21 DIAGNOSIS — N920 Excessive and frequent menstruation with regular cycle: Secondary | ICD-10-CM

## 2014-12-21 DIAGNOSIS — D500 Iron deficiency anemia secondary to blood loss (chronic): Secondary | ICD-10-CM

## 2014-12-21 DIAGNOSIS — N939 Abnormal uterine and vaginal bleeding, unspecified: Secondary | ICD-10-CM | POA: Diagnosis present

## 2014-12-21 DIAGNOSIS — D649 Anemia, unspecified: Secondary | ICD-10-CM

## 2014-12-21 LAB — CBC
HCT: 28 % — ABNORMAL LOW (ref 36.0–46.0)
HEMOGLOBIN: 8.4 g/dL — AB (ref 12.0–15.0)
MCH: 21.3 pg — AB (ref 26.0–34.0)
MCHC: 30 g/dL (ref 30.0–36.0)
MCV: 70.9 fL — AB (ref 78.0–100.0)
Platelets: 302 10*3/uL (ref 150–400)
RBC: 3.95 MIL/uL (ref 3.87–5.11)
RDW: 18.7 % — ABNORMAL HIGH (ref 11.5–15.5)
WBC: 5.8 10*3/uL (ref 4.0–10.5)

## 2014-12-21 LAB — TYPE AND SCREEN
ABO/RH(D): O POS
Antibody Screen: NEGATIVE
UNIT DIVISION: 0
Unit division: 0

## 2014-12-21 LAB — TROPONIN I

## 2014-12-21 MED ORDER — CYCLOBENZAPRINE HCL 10 MG PO TABS: 10.0000 mg | ORAL_TABLET | Freq: Three times a day (TID) | ORAL | Status: DC | PRN

## 2014-12-21 MED ORDER — FERROUS SULFATE 325 (65 FE) MG PO TABS: 325.0000 mg | ORAL_TABLET | Freq: Three times a day (TID) | ORAL | Status: DC

## 2014-12-21 MED ORDER — SALINE SPRAY 0.65 % NA SOLN
1.0000 | NASAL | Status: DC | PRN
  Filled 2014-12-21: qty 44

## 2014-12-21 MED ORDER — FERROUS GLUCONATE 324 (38 FE) MG PO TABS: 324.0000 mg | ORAL_TABLET | Freq: Three times a day (TID) | ORAL | Status: DC

## 2014-12-21 NOTE — Discharge Instructions (Addendum)
Iron Deficiency Anemia, Adult Anemia is a condition in which there are less red blood cells or hemoglobin in the blood than normal. Hemoglobin is the part of red blood cells that carries oxygen. Iron deficiency anemia is anemia caused by too little iron. It is the most common type of anemia. It may leave you tired and short of breath. CAUSES   Lack of iron in the diet.  Poor absorption of iron, as seen with intestinal disorders.  Intestinal bleeding.  Heavy periods. SIGNS AND SYMPTOMS  Mild anemia may not be noticeable. Symptoms may include:  Fatigue.  Headache.  Pale skin.  Weakness.  Tiredness.  Shortness of breath.  Dizziness.  Cold hands and feet.  Fast or irregular heartbeat. DIAGNOSIS  Diagnosis requires a thorough evaluation and physical exam by your health care provider. Blood tests are generally used to confirm iron deficiency anemia. Additional tests may be done to find the underlying cause of your anemia. These may include:  Testing for blood in the stool (fecal occult blood test).  A procedure to see inside the colon and rectum (colonoscopy).  A procedure to see inside the esophagus and stomach (endoscopy). TREATMENT  Iron deficiency anemia is treated by correcting the cause of the deficiency. Treatment may involve:  Adding iron-rich foods to your diet.  Taking iron supplements. Pregnant or breastfeeding women need to take extra iron because their normal diet usually does not provide the required amount.  Taking vitamins. Vitamin C improves the absorption of iron. Your health care provider may recommend that you take your iron tablets with a glass of orange juice or vitamin C supplement.  Medicines to make heavy menstrual flow lighter.  Surgery. HOME CARE INSTRUCTIONS   Take iron as directed by your health care provider.  If you cannot tolerate taking iron supplements by mouth, talk to your health care provider about taking them through a vein  (intravenously) or an injection into a muscle.  For the best iron absorption, iron supplements should be taken on an empty stomach. If you cannot tolerate them on an empty stomach, you may need to take them with food.  Do not drink milk or take antacids at the same time as your iron supplements. Milk and antacids may interfere with the absorption of iron.  Iron supplements can cause constipation. Make sure to include fiber in your diet to prevent constipation. A stool softener may also be recommended.  Take vitamins as directed by your health care provider.  Eat a diet rich in iron. Foods high in iron include liver, lean beef, whole-grain bread, eggs, dried fruit, and dark green leafy vegetables. SEEK IMMEDIATE MEDICAL CARE IF:   You faint. If this happens, do not drive. Call your local emergency services (911 in [place] if no other help is available.  You have chest pain.  You feel nauseous or vomit.  You have severe or increased shortness of breath with activity.  You feel weak.  You have a rapid heartbeat.  You have unexplained sweating.  You become light-headed when getting up from a chair or bed. MAKE SURE YOU:   Understand these instructions.  Will watch your condition.  Will get help right away if you are not doing well or get worse.   This information is not intended to replace advice given to you by your health care provider. Make sure you discuss any questions you have with your health care provider.   Document Released: 01/18/2000 Document Revised: 02/10/2014 Document Reviewed: 09/27/2012 Elsevier  Interactive Patient Education 2016 [place].   Menorrhagia Menorrhagia is the medical term for when your menstrual periods are heavy or last longer than usual. With menorrhagia, every period you have may cause enough blood loss and cramping that you are unable to maintain your usual activities. CAUSES  In some cases, the cause of heavy periods is unknown, but a  number of conditions may cause menorrhagia. Common causes include:  A problem with the hormone-producing thyroid gland (hypothyroid).  Noncancerous growths in the uterus (polyps or fibroids).  An imbalance of the estrogen and progesterone hormones.  One of your ovaries not releasing an egg during one or more months.  Side effects of having an intrauterine device (IUD).  Side effects of some medicines, such as anti-inflammatory medicines or blood thinners.  A bleeding disorder that stops your blood from clotting normally. SIGNS AND SYMPTOMS  During a normal period, bleeding lasts between 4 and 8 days. Signs that your periods are too heavy include:  You routinely have to change your pad or tampon every 1 or 2 hours because it is completely soaked.  You pass blood clots larger than 1 inch (2.5 cm) in size.  You have bleeding for more than 7 days.  You need to use pads and tampons at the same time because of heavy bleeding.  You need to wake up to change your pads or tampons during the night.  You have symptoms of anemia, such as tiredness, fatigue, or shortness of breath. DIAGNOSIS  Your health care provider will perform a physical exam and ask you questions about your symptoms and menstrual history. Other tests may be ordered based on what the health care provider finds during the exam. These tests can include:  Blood tests. Blood tests are used to check if you are pregnant or have hormonal changes, a bleeding or thyroid disorder, low iron levels (anemia), or other problems.  Endometrial biopsy. Your health care provider takes a sample of tissue from the inside of your uterus to be examined under a microscope.  Pelvic ultrasound. This test uses sound waves to make a picture of your uterus, ovaries, and vagina. The pictures can show if you have fibroids or other growths.  Hysteroscopy. For this test, your health care provider will use a small telescope to look inside your  uterus. Based on the results of your initial tests, your health care provider may recommend further testing. TREATMENT  Treatment may not be needed. If it is needed, your health care provider may recommend treatment with one or more medicines first. If these do not reduce bleeding enough, a surgical treatment might be an option. The best treatment for you will depend on:   Whether you need to prevent pregnancy.  Your desire to have children in the future.  The cause and severity of your bleeding.  Your opinion and personal preference.  Medicines for menorrhagia may include:  Birth control methods that use hormones. These include the pill, skin patch, vaginal ring, shots that you get every 3 months, hormonal IUD, and implant. These treatments reduce bleeding during your menstrual period.  Medicines that thicken blood and slow bleeding.  Medicines that reduce swelling, such as ibuprofen.  Medicines that contain a synthetic hormone called progestin.   Medicines that make the ovaries stop working for a short time.  You may need surgical treatment for menorrhagia if the medicines are unsuccessful. Treatment options include:  Dilation and curettage (D&C). In this procedure, your health care provider opens (dilates)  your cervix and then scrapes or suctions tissue from the lining of your uterus to reduce menstrual bleeding.  Operative hysteroscopy. This procedure uses a tiny tube with a light (hysteroscope) to view your uterine cavity and can help in the surgical removal of a polyp that may be causing heavy periods.  Endometrial ablation. Through various techniques, your health care provider permanently destroys the entire lining of your uterus (endometrium). After endometrial ablation, most women have little or no menstrual flow. Endometrial ablation reduces your ability to become pregnant.  Endometrial resection. This surgical procedure uses an electrosurgical wire loop to remove the  lining of the uterus. This procedure also reduces your ability to become pregnant.  Hysterectomy. Surgical removal of the uterus and cervix is a permanent procedure that stops menstrual periods. Pregnancy is not possible after a hysterectomy. This procedure requires anesthesia and hospitalization. HOME CARE INSTRUCTIONS   Only take over-the-counter or prescription medicines as directed by your health care provider. Take prescribed medicines exactly as directed. Do not change or switch medicines without consulting your health care provider.  Take any prescribed iron pills exactly as directed by your health care provider. Long-term heavy bleeding may result in low iron levels. Iron pills help replace the iron your body lost from heavy bleeding. Iron may cause constipation. If this becomes a problem, increase the bran, fruits, and roughage in your diet.  Do not take aspirin or medicines that contain aspirin 1 week before or during your menstrual period. Aspirin may make the bleeding worse.  If you need to change your sanitary pad or tampon more than once every 2 hours, stay in bed and rest as much as possible until the bleeding stops.  Eat well-balanced meals. Eat foods high in iron. Examples are leafy green vegetables, meat, liver, eggs, and whole grain breads and cereals. Do not try to lose weight until the abnormal bleeding has stopped and your blood iron level is back to normal. SEEK MEDICAL CARE IF:   You soak through a pad or tampon every 1 or 2 hours, and this happens every time you have a period.  You need to use pads and tampons at the same time because you are bleeding so much.  You need to change your pad or tampon during the night.  You have a period that lasts for more than 8 days.  You pass clots bigger than 1 inch wide.  You have irregular periods that happen more or less often than once a month.  You feel dizzy or faint.  You feel very weak or tired.  You feel short of  breath or feel your heart is beating too fast when you exercise.  You have nausea and vomiting or diarrhea while you are taking your medicine.  You have any problems that may be related to the medicine you are taking. SEEK IMMEDIATE MEDICAL CARE IF:   You soak through 4 or more pads or tampons in 2 hours.  You have any bleeding while you are pregnant. MAKE SURE YOU:   Understand these instructions.  Will watch your condition.  Will get help right away if you are not doing well or get worse.   This information is not intended to replace advice given to you by your health care provider. Make sure you discuss any questions you have with your health care provider.   Document Released: 01/20/2005 Document Revised: 01/25/2013 Document Reviewed: 07/11/2012 Elsevier Interactive Patient Education [employer].

## 2014-12-21 NOTE — Discharge Summary (Addendum)
Physician Discharge Summary  Nina Hopkins [id number] DOB: January 07, 1961 DOA: 12/20/2014  PCP: No PCP Per Patient  Admit date: 12/20/2014 Discharge date: 12/21/2014  Time spent: 25 minutes  Recommendations for Outpatient Follow-up:  Discharge home on iron supplements. She [facility]. Patient will need to be treated for her menorrhagia and needs pelvic US to r/o fibroids and outpt GYN referral. Please check H&h during outpatient follow-up.  Discharge Diagnoses:  Principal Problem:   Iron deficiency anemia due to chronic blood loss  Active Problems:   Dyspnea   HTN (hypertension)   Menorrhagia   Symptomatic anemia   Back pain, thoracic   Discharge Condition: Fair  Diet recommendation: Regular  Filed Weights   12/20/14 1152  Weight: 74.39 kg (164 lb)    History of present illness:  54 year old female with history of hypertension, not on any medications at present in history of heavy menses presented to the ED with shortness of breath, fatigue and pain over the left scapular area worsened with movement and changing posture. Patient reports gradual worsening of her dyspnea with exertion for the past 2 months and has been progressing for the past 2 days. She reports having heavy menstrual period with occasional clots  and lasting for 5-6 days. Denies any headache or dizziness. Has been going on for the past 8 months. Denies any fever, chills, headache, blurred vision, dizziness, chest pain, palpitations, orthopnea or PND. Denies any hematemesis or melena. Reports occasional use of ibuprofen for pain. Denies any weight loss or loss of appetite. Patient reports pain in her left scapular area which is localized and sharp without any radiation.  In the ED her pressure was elevated. Blood work done showed hemoglobin of 6, hematocrit of 21.7 with low MCV. EKG was unremarkable and severe troponins were negative. Patient admitted under  observation and transfuse with 2 units PRBC.  Hospital Course:  Symptomatic microcytic anemia due to iron deficiency Patient transfuse a 2 unit PRBC and hemoglobin adequately improved to 8.4. Patient is no longer symptomatic. It is likely due to menorrhagia. Iron panel suggests iron deficiency and she has been started on ferrous sulfate. [facility] outpatient [facility]. Patient will need a pelvic ultrasound to evaluate for uterine fibroids and also will need outpatient GYN referral. Stool for occult blood ordered for patient has not had a bowel movement. Given her age she will need outpatient referral to GI for routine colonoscopy.  Essential hypertension Blood pressure was elevated on presentation possibly associated symptoms. Patient is not on any medications at this time and blood pressure has remained stable since admission. Please monitor blood pressure during outpatient visit. I have not started her on any medications.  Left scapular pain Appears to be musculoskeletal and position. EKG unremarkable and serial troponins have been negative. I would prescribe her with a short course of Flexeril.  She is stable to be discharged home.  Procedures:  None  Consultations:  None  Discharge Exam: Filed Vitals:   12/21/14 0555  BP: 128/82  Pulse: 65  Temp: 98.5 F (36.9 C)  Resp: 18    General: Middle aged female not in distress HEENT: Pallor present, moist oral mucosa, supple neck Cardiovascular: S1 and S2, no murmur  chest: Clear to auscultation bilaterally Soft, nondistended, nontender, bowel sounds present Musculoskeletal: Warm, no edema  Discharge Instructions    Current Discharge Medication List    START taking these medications  Details  cyclobenzaprine (FLEXERIL) 10 MG tablet Take 1 tablet (10 mg total) by mouth 3 (three) times daily as needed for muscle spasms. Qty: 30 tablet, Refills: 0    ferrous sulfate    325 MG tablet Take 1 tablet (325 mg total) by mouth 3 (three) times daily with meals. Qty: 90 tablet, Refills: 3      STOP taking these medications     ibuprofen (ADVIL,MOTRIN) 200 MG tablet      azithromycin (ZITHROMAX) 250 MG tablet      hydrochlorothiazide (HYDRODIURIL) 25 MG tablet        Allergies  Allergen Reactions  . Prilosec [Omeprazole]     Stiff neck   Follow-up Information    Follow up with [facility] On 01/08/2015.   Specialty:  Internal Medicine   Contact information:   [place] [phone]       The results of significant diagnostics from this hospitalization (including imaging, microbiology, ancillary and laboratory) are listed below for reference.    Significant Diagnostic Studies: Dg Chest 2 View  12/20/2014  CLINICAL DATA:  Left-sided chest pain for 2 days, no known injury, initial encounter EXAM: CHEST - 2 VIEW COMPARISON:  07/09/2014 FINDINGS: Cardiac shadow is within normal limits. The lungs are clear bilaterally. No acute fracture is noted. IMPRESSION: No active disease. Electronically Signed   By: Inez Catalina M.D.   On: 12/20/2014 12:51    Microbiology: No results found for this or any previous visit (from the past 240 hour(s)).   Labs: Basic Metabolic Panel:  Recent Labs Lab 12/20/14 1204  NA 136  K 4.1  CL 107  CO2 22  GLUCOSE 93  BUN 7  CREATININE 0.68  CALCIUM 9.0   Liver Function Tests: No results for input(s): AST, ALT, ALKPHOS, BILITOT, PROT, ALBUMIN in the last 168 hours. No results for input(s): LIPASE, AMYLASE in the last 168 hours. No results for input(s): AMMONIA in the last 168 hours. CBC:  Recent Labs Lab 12/20/14 1204 12/21/14 0624  WBC 4.7 5.8  HGB 6.0* 8.4*  HCT 21.7* 28.0*  MCV 67.8* 70.9*  PLT 333 302   Cardiac Enzymes:  Recent Labs Lab 12/20/14 1940 12/21/14 0624  TROPONINI <0.03 <0.03   BNP: BNP (last 3 results) No results for  input(s): BNP in the last 8760 hours.  ProBNP (last 3 results) No results for input(s): PROBNP in the last 8760 hours.  CBG: No results for input(s): GLUCAP in the last 168 hours.     SignedLouellen Molder  [facility] 12/21/2014, 10:50 AM

## 2014-12-21 NOTE — Care Management Note (Signed)
Case Management Note  Patient Details  Name: Nina Hopkins MRN: [id number] Date of Birth: [birth year]  Subjective/Objective:    Date: 12/21/14 Spoke with patient at the bedside.  Introduced self as Tourist information centre manager and explained role in discharge planning and how to be reached.  Verified patient lives in town, with children.  Expressed potential need for no other DME.  Verified patient anticipates to go home with family, at time of discharge. She is independent.  Patient confirmed needing help with their medication.  Patient  is driven a family member to MD appointments.  Verified patient has no  PCP  will be going to Sickle cell clinic for hospital follow up (over flow for [facility] and wellness clinic), NCM gave patient brochures for [facility] and wellness clinic to pick up medications at dc.  NCM verified they do have the meds at [facility] clinic.  Also gave patient brochure for the sickle cell clinic. Patient states she will walk over to clinic to pick meds up at dc.    Plan: CM will continue to follow for discharge planning and [facility] resources.                 Action/Plan:   Expected Discharge Date:                  Expected Discharge Plan:  Home/Self Care  In-House Referral:     Discharge planning Services  CM Consult, Follow-up appt scheduled, [facility], Medication Assistance  Post Acute Care Choice:    Choice offered to:     DME Arranged:    DME Agency:     HH Arranged:    [place] Agency:     Status of Service:  Completed, signed off  Medicare Important Message Given:    Date Medicare IM Given:    Medicare IM give by:    Date Additional Medicare IM Given:    Additional Medicare Important Message give by:     If discussed at [place] of Stay Meetings, dates discussed:    Additional Comments:  Zenon Mayo, RN 12/21/2014, 12:11 PM

## 2015-01-08 ENCOUNTER — Ambulatory Visit (INDEPENDENT_AMBULATORY_CARE_PROVIDER_SITE_OTHER): Payer: Self-pay | Admitting: Family Medicine

## 2015-01-08 ENCOUNTER — Encounter: Payer: Self-pay | Admitting: Family Medicine

## 2015-01-08 VITALS — BP 168/101 | HR 75 | Temp 98.1°F | Ht 61.0 in | Wt 169.0 lb

## 2015-01-08 DIAGNOSIS — D649 Anemia, unspecified: Secondary | ICD-10-CM

## 2015-01-08 DIAGNOSIS — I10 Essential (primary) hypertension: Secondary | ICD-10-CM

## 2015-01-08 DIAGNOSIS — Z7689 Persons encountering health services in other specified circumstances: Secondary | ICD-10-CM

## 2015-01-08 DIAGNOSIS — N926 Irregular menstruation, unspecified: Secondary | ICD-10-CM

## 2015-01-08 DIAGNOSIS — Z7189 Other specified counseling: Secondary | ICD-10-CM

## 2015-01-08 DIAGNOSIS — Z23 Encounter for immunization: Secondary | ICD-10-CM

## 2015-01-08 LAB — COMPLETE METABOLIC PANEL WITH GFR
ALT: 22 U/L (ref 6–29)
AST: 22 U/L (ref 10–35)
Albumin: 3.9 g/dL (ref 3.6–5.1)
Alkaline Phosphatase: 41 U/L (ref 33–130)
BUN: 10 mg/dL (ref 7–25)
CHLORIDE: 106 mmol/L (ref 98–110)
CO2: 26 mmol/L (ref 20–31)
CREATININE: 0.74 mg/dL (ref 0.50–1.05)
Calcium: 8.4 mg/dL — ABNORMAL LOW (ref 8.6–10.4)
GFR, Est Non African American: >89 mL/min (ref >=60)
Glucose, Bld: 82 mg/dL (ref 65–99)
POTASSIUM: 4.1 mmol/L (ref 3.5–5.3)
Sodium: 138 mmol/L (ref 135–146)
Total Bilirubin: 0.4 mg/dL (ref 0.2–1.2)
Total Protein: 6.8 g/dL (ref 6.1–8.1)

## 2015-01-08 LAB — LIPID PANEL
CHOL/HDL RATIO: 2.8 ratio (ref <=5.0)
Cholesterol: 201 mg/dL — ABNORMAL HIGH (ref 125–200)
HDL: 73 mg/dL (ref >=46)
LDL CALC: 102 mg/dL (ref <130)
TRIGLYCERIDES: 132 mg/dL (ref <150)
VLDL: 26 mg/dL (ref <30)

## 2015-01-08 MED ORDER — HYDROCHLOROTHIAZIDE 25 MG PO TABS: 25.0000 mg | ORAL_TABLET | Freq: Every day | ORAL | Status: DC

## 2015-01-08 NOTE — Progress Notes (Signed)
Patient ID: Nina Hopkins, female   DOB: [birth year], 54 y.o.   MRN: [id number]   Areiana Taves, is a 54 y.o. female  [id number]  [id number]  DOB - 02-21-1960  CC:  Chief Complaint  Patient presents with  . New Patient establish care    recent transfusion at [facility] for anemia, cause uncertain, continues to complain of SOB left sided pain when she takes a deep breath , and upper back pain, needs refill on b/p meds       HPI: Nina Hopkins is a 54 y.o. female here to establish care. She was in hospital for anemia and a blood transfusion on 11/16-11/17. Her hbg was 8.4. She was referred here to establish care and for follow-up. She reports that she does often have 2 fairly heavy period in a month. It is presumed that this is the cause of her anemia. She has not had a PAP in a number of years. She is currently taking 325 mg of Ferrous sulfate 3 times a day as prescribed by ED. She also has a history of hypertension and in not currently on medication. Previously she was on Hctz 25 mg daily with better but not complete control. Part of her symptoms that took her to the ED was shortness of breath. She did have a chest x-ray which showed no abnormality. She continues to complain of discomfort in her left chest from the midline of the breast around to the back. The discomfort is with deep breaths. She denies other chronic illnesses.  Allergies  Allergen Reactions  . Prilosec [Omeprazole]     Stiff neck   Past Medical History  Diagnosis Date  . Hypertension   . Family history of adverse reaction to anesthesia     "my sister had a hard time waking up"  . Anemia   . Anxiety ~ 2008    "nothing since ~ 2008" (12/20/2014)  . History of blood transfusion 12/20/2014    related to anemia   Current Outpatient Prescriptions on File Prior to Visit  Medication Sig Dispense Refill  . ferrous sulfate 325 (65 FE) MG tablet Take 1 tablet (325 mg total) by mouth 3 (three) times daily with meals.  90 tablet 3   No current facility-administered medications on file prior to visit.   History reviewed. No pertinent family history. Social History   Social History  . Marital Status: Divorced    Spouse Name: N/A  . Number of Children: N/A  . Years of Education: N/A   Occupational History  . Not on file.   Social History Main Topics  . Smoking status: Never Smoker   . Smokeless tobacco: Never Used  . Alcohol Use: No     Comment: 12/20/2014 "might drink 3 times/year tops"  . Drug Use: No  . Sexual Activity: Not Currently   Other Topics Concern  . Not on file   Social History Narrative    Review of Systems: Constitutional: Negative for fever, chills, appetite change, weight loss,  Fatigue. Skin: Negative for rashes or lesions of concern. HENT: Negative for ear pain, ear discharge.nose bleeds Eyes: Negative for pain, discharge, redness, itching and visual disturbance. Neck: Negative for pain, stiffness Respiratory: Negative for cough. Postive for mild shortmess of breath. Cardiovascular: Negative for chest pain,  and leg swelling. Positive for palpitations intermittently  Gastrointestinal: Negative for abdominal pain, nausea, vomiting, diarrhea, constipations Genitourinary: Negative for dysuria, urgency, frequency, hematuria,  Musculoskeletal: Negative for back pain, joint pain, joint  swelling, and gait  problem.Negative for weakness.Postive for anterior/lateral/posterior chest discomfort with deep breaths.  Neurological: Negative for dizziness, tremors, seizures, syncope,   light-headedness, numbness and headaches.  Hematological: Negative for easy bruising or bleeding Psychiatric/Behavioral: Negative for depression, anxiety, decreased concentration, confusion   Objective:   Filed Vitals:   01/08/15 1412  BP: 168/101  Pulse: 75  Temp: 98.1 F (36.7 C)    Physical Exam: Constitutional: Patient appears well-developed and well-nourished. No distress. HENT:  Normocephalic, atraumatic, External right and left ear normal. Oropharynx is clear and moist.  Eyes: Conjunctivae and EOM are normal. PERRLA, no scleral icterus. Neck: Normal ROM. Neck supple. No lymphadenopathy, No thyromegaly. CVS: RRR, S1/S2 +, no murmurs, no gallops, no rubs Pulmonary: Effort and breath sounds normal, no stridor, rhonchi, wheezes, rales.  Abdominal: Soft. Normoactive BS,, no distension, tenderness, rebound or guarding.  Musculoskeletal: Normal range of motion. No edema and no tenderness.  Neuro: Alert.Normal muscle tone coordination. Non-focal Skin: Skin is warm and dry. No rash noted. Not diaphoretic. No erythema. No pallor.Conjunctive pink, tongue pink, good capillary refill of the nails.  Psychiatric: Normal mood and affect. Behavior, judgment, thought content normal.  Lab Results  Component Value Date   WBC 5.8 12/21/2014   HGB 8.4* 12/21/2014   HCT 28.0* 12/21/2014   MCV 70.9* 12/21/2014   PLT 302 12/21/2014   Lab Results  Component Value Date   CREATININE 0.68 12/20/2014   BUN 7 12/20/2014   NA 136 12/20/2014   K 4.1 12/20/2014   CL 107 12/20/2014   CO2 22 12/20/2014    No results found for: HGBA1C Lipid Panel  No results found for: CHOL, TRIG, HDL, CHOLHDL, VLDL, LDLCALC     Assessment and plan:   1. Essential hypertension  - COMPLETE METABOLIC PANEL WITH GFR - Lipid panel  2. Anemia, unspecified anemia type  - CBC with Differential -Continue Ferrous Sulfate and prescribed in ED.  3. Irregular periods/menstrual cycles  - FSH/LH  4. Encounter to establish care - I have reviewed information provided by the patient and information available in the chart.  Return in about 2 weeks (around 01/22/2015).  The patient was given clear instructions to go to ER or return to medical center if symptoms don't improve, worsen or new problems develop. The patient verbalized understanding.    Micheline Chapman FNP  01/08/2015, 4:12 PM

## 2015-01-08 NOTE — Patient Instructions (Signed)
Start back on hydrochlorazide 25 mg daily and come back for NV for BP check in 2 weeks.  We will let you know the status of your anemia. Come back to see me in about a month. At that time we will do a PAP Get your hospital bill and take to [facility] to see about getting a [employer]. Also apply for orange card.

## 2015-01-09 LAB — CBC WITH DIFFERENTIAL/PLATELET
BASOS ABS: 0.1 10*3/uL (ref 0.0–0.1)
Basophils Relative: 1 % (ref 0–1)
Eosinophils Absolute: 0.1 10*3/uL (ref 0.0–0.7)
Eosinophils Relative: 2 % (ref 0–5)
HEMATOCRIT: 33.8 % — AB (ref 36.0–46.0)
HEMOGLOBIN: 10.3 g/dL — AB (ref 12.0–15.0)
LYMPHS ABS: 1.3 10*3/uL (ref 0.7–4.0)
LYMPHS PCT: 21 % (ref 12–46)
MCH: 23.2 pg — ABNORMAL LOW (ref 26.0–34.0)
MCHC: 30.5 g/dL (ref 30.0–36.0)
MCV: 76.1 fL — AB (ref 78.0–100.0)
MPV: 10.1 fL (ref 8.6–12.4)
Monocytes Absolute: 0.4 10*3/uL (ref 0.1–1.0)
Monocytes Relative: 7 % (ref 3–12)
NEUTROS ABS: 4.1 10*3/uL (ref 1.7–7.7)
NEUTROS PCT: 69 % (ref 43–77)
Platelets: 312 10*3/uL (ref 150–400)
RBC: 4.44 MIL/uL (ref 3.87–5.11)
RDW: 27.1 % — ABNORMAL HIGH (ref 11.5–15.5)
WBC: 6 10*3/uL (ref 4.0–10.5)

## 2015-01-09 LAB — FSH/LH
FSH: 7.1 m[IU]/mL
LH: 6.6 m[IU]/mL

## 2015-01-22 ENCOUNTER — Other Ambulatory Visit: Payer: Self-pay

## 2015-01-23 ENCOUNTER — Other Ambulatory Visit: Payer: Self-pay

## 2015-02-12 ENCOUNTER — Ambulatory Visit: Payer: Self-pay | Admitting: Family Medicine

## 2015-06-05 ENCOUNTER — Emergency Department (HOSPITAL_COMMUNITY)
Admission: EM | Admit: 2015-06-05 | Discharge: 2015-06-05 | Disposition: A | Discharge status: Home / Self Care | Payer: Self-pay | Attending: Emergency Medicine | Admitting: Emergency Medicine

## 2015-06-05 ENCOUNTER — Encounter (HOSPITAL_COMMUNITY): Payer: Self-pay

## 2015-06-05 DIAGNOSIS — K029 Dental caries, unspecified: Secondary | ICD-10-CM | POA: Insufficient documentation

## 2015-06-05 DIAGNOSIS — I10 Essential (primary) hypertension: Secondary | ICD-10-CM | POA: Insufficient documentation

## 2015-06-05 DIAGNOSIS — Z8659 Personal history of other mental and behavioral disorders: Secondary | ICD-10-CM | POA: Insufficient documentation

## 2015-06-05 DIAGNOSIS — K0889 Other specified disorders of teeth and supporting structures: Secondary | ICD-10-CM | POA: Insufficient documentation

## 2015-06-05 DIAGNOSIS — D649 Anemia, unspecified: Secondary | ICD-10-CM | POA: Insufficient documentation

## 2015-06-05 MED ORDER — OXYCODONE-ACETAMINOPHEN 5-325 MG PO TABS
1.0000 | ORAL_TABLET | Freq: Once | ORAL | Status: AC
  Administered 2015-06-05: 1 via ORAL
  Filled 2015-06-05: qty 1

## 2015-06-05 MED ORDER — PENICILLIN V POTASSIUM 250 MG PO TABS
500.0000 mg | ORAL_TABLET | Freq: Once | ORAL | Status: AC
  Administered 2015-06-05: 500 mg via ORAL
  Filled 2015-06-05: qty 2

## 2015-06-05 MED ORDER — PENICILLIN V POTASSIUM 500 MG PO TABS: 500.0000 mg | ORAL_TABLET | Freq: Four times a day (QID) | ORAL | Status: AC

## 2015-06-05 NOTE — ED Provider Notes (Signed)
CSN: [id number]     Arrival date & time 06/05/15  1808 History   By signing my name below, I, [facility], attest that this documentation has been prepared under the direction and in the presence of [employer], [employer]. Electronically Signed: Virgel Bouquet, ED Scribe. 06/05/2015. 6:56 PM.   Chief Complaint  Patient presents with  . Dental Pain   The history is provided by the patient. No language interpreter was used.  HPI Comments: Nina Hopkins is a 55 y.o. female who presents to the Emergency Department complaining of constant, mild left upper dental pain onset 1 days. Patient states that she had a filling come out of her tooth 1 week ago but pain did not begin until yesterday. She has taken Advil with temporary relief only. Denies seeing a dentist recently for this complaint. Denies having dental insurance and having a dentist currently. Denies difficulty swallowing, fever, difficulty breathing. NKDA to antibiotics.  Past Medical History  Diagnosis Date  . Hypertension   . Family history of adverse reaction to anesthesia     "my sister had a hard time waking up"  . Anemia   . Anxiety ~ 2008    "nothing since ~ 2008" (12/20/2014)  . History of blood transfusion 12/20/2014    related to anemia   Past Surgical History  Procedure Laterality Date  . Tubal ligation  1990's   No family history on file. Social History  Substance Use Topics  . Smoking status: Never Smoker   . Smokeless tobacco: Never Used  . Alcohol Use: No     Comment: 12/20/2014 "might drink 3 times/year tops"   OB History    No data available     Review of Systems  Constitutional: Negative for fever.  HENT: Positive for dental problem. Negative for trouble swallowing.    Allergies  Prilosec  Home Medications   Prior to Admission medications   Medication Sig Start Date End Date Taking? Authorizing Provider  ferrous sulfate 325 (65 FE) MG tablet Take 1 tablet (325 mg total) by mouth 3  (three) times daily with meals. 12/21/14   Nishant Dhungel, MD  hydrochlorothiazide (HYDRODIURIL) 25 MG tablet Take 1 tablet (25 mg total) by mouth daily. 01/08/15   Micheline Chapman, NP  penicillin v potassium (VEETID) 500 MG tablet Take 1 tablet (500 mg total) by mouth 4 (four) times daily. 06/05/15 06/12/15  Kataleah Bejar Pilcher Kazuko Clemence, PA-C   BP 139/91 mmHg  Pulse 98  Temp(Src) 99.4 F (37.4 C) (Oral)  Resp 16  Ht 5\' 1"  (1.549 m)  Wt 77.111 kg  BMI 32.14 kg/m2  SpO2 100% Physical Exam  Constitutional: She is oriented to person, place, and time. She appears well-developed and well-nourished. No distress.  HENT:  Head: Normocephalic and atraumatic.  Dental cavities and poor oral dentition noted, pain along tooth as depicted in image, midline uvula, no trismus, oropharynx moist and clear, no abscess noted, no oropharyngeal erythema or edema, neck supple and no tenderness. No facial edema.  Eyes: Conjunctivae are normal.  Neck: Normal range of motion.  Cardiovascular: Normal rate, regular rhythm and normal heart sounds.  Exam reveals no gallop and no friction rub.   No murmur heard. Pulmonary/Chest: Effort normal and breath sounds normal. No respiratory distress. She has no wheezes. She has no rales.  Musculoskeletal: Normal range of motion.  Neurological: She is alert and oriented to person, place, and time.  Skin: Skin is warm and dry.  Psychiatric: She has a normal  mood and affect. Her behavior is normal.  Nursing note and vitals reviewed.  ED Course  Procedures   DIAGNOSTIC STUDIES: Oxygen Saturation is 100% on RA, normal by my interpretation.    COORDINATION OF CARE: 6:52 PM Will order antibiotics and pain medication. Will prescribe antibiotics. Advised pt to continue to take ibuprofen as needed. Will provide pt with referral list for a dentist. Discussed treatment plan with pt at bedside and pt agreed to plan.   MDM   Final diagnoses:  Dentalgia   Patient with dentalgia. No  abscess requiring immediate incision and drainage. Patient is afebrile, non toxic appearing, and swallowing secretions well. Exam not concerning for Ludwig's angina or pharyngeal abscess. Will treat with PenVK. I gave dental resource guide and stressed the importance of dental follow up for ultimate management of dental pain. Patient voices understanding and is agreeable to plan.  I personally performed the services described in this documentation, which was scribed in my presence. The recorded information has been reviewed and is accurate.   [facility] Tynesha Free, PA-C 06/05/15 1917  Carmin Muskrat, MD 06/05/15 [phone]

## 2015-06-05 NOTE — ED Notes (Signed)
Patient driven by daughter.

## 2015-06-05 NOTE — ED Notes (Signed)
Patient here with left upper dental pain x 2 days. Pain to same

## 2015-06-05 NOTE — Discharge Instructions (Signed)
.You have a dental injury. It is very important that you get evaluated by a dentist as soon as possible. Call tomorrow to schedule an appointment. Ibuprofen as needed for pain. Take your full course of antibiotics. Read the instructions below.  Eat a soft or liquid diet and rinse your mouth out after meals with warm water. You should see a dentist or return here at once if you have increased swelling, increased pain or uncontrolled bleeding from the site of your injury.  SEEK MEDICAL CARE IF:   You have increased pain not controlled with medicines.   You have swelling around your tooth, in your face or neck.   You have bleeding which starts, continues, or gets worse.   You have a fever >101 If you are unable to open your mouth  [place] [employer] is a great source of information about community services available.  Access by dialing 2-1-1 from anywhere in [place], or by website -  CustodianSupply.fi.   Other Local Resources (Updated 02/2015)  Dental  Care   Services    Phone Number and Address  Cost  [facility] For children 5 - 27 years of age:   Cleaning  Tooth brushing/flossing instruction  Sealants, fillings, crowns  Extractions  Emergency treatment  [phone] [address], [place] [address] Charges based on family income.  Medicaid and some insurance plans accepted.     [facility] - [facility], fillings, crowns  Extractions  Emergency treatment [phone] [place], [place]  Pregnant women 39 years of age or older with a Medicaid card  [facility] - [place]  Cleaning  Sealants, fillings, crowns  Extractions  Emergency treatment [phone] [address] [place], [place] Pregnant women 83 years of age or older with a Medicaid card  [facility] For children 60 - 53 years of age:   Cleaning  Tooth brushing/flossing instruction  Sealants, fillings, crowns  Extractions  Emergency treatment Limited orthodontic services for patients with Medicaid [phone] [address], [place] [address] Medicaid and [facility] Health Choice cover for children up to age 8 and pregnant women.  Parents of children up to age 23 without Medicaid pay a reduced fee at time of service.  [place] For children 14 - 26 years of age:   Cleaning  Tooth brushing/flossing instruction  Sealants, fillings, crowns  Extractions  Emergency treatment Limited orthodontic services for patients with Medicaid [phone] [place], [place].  Medicaid and [place] Health Choice cover for children up to age 52 and pregnant women.  Parents of children up to age 66 without Medicaid pay a reduced fee.  [facility]  Sealants, fillings, crowns  Extractions  Hours: Tuesdays and Thursdays, 4:15 - 8 pm [phone] [address] [place], [place] [address] Services free of charge to [facility] residents ages 18-64 who do not have health insurance, Medicare, [place], or [place] benefits and fall within federal poverty guidelines  [place] care in addition to primary medical care, nutritional counseling, and pharmacy:  Cleaning  Sealants, fillings, crowns  Extractions                  [phone]  [facility], [place], [place], [place], [place], [place] [facility], [place], [place] [facility] [place], [place], [place], most insurance.  Also provides services available to all with fees adjusted based on ability to pay.    [facility]  Cleaning  Tooth brushing/flossing instruction  Sealants, fillings, crowns  Extractions  Emergency treatment Hours: Tuesdays, Thursdays, and Fridays from 8 am to 5 pm by appointment only. [phone] [address], [place] [address] [facility] residents with Medicaid (depending on eligibility) and children with [facility] Health Choice - call for more information.  [facility]  Extractions only  Hours: 2nd and 4th Thursday of each month from 6:30 am - 9 am.   [phone] ext. [place], [place] [address] Ages 32 and older only.  Patients are seen on a first come, first served basis.  [employer]  [employer]  Extractions  Orthodontics  Endodontics  Implants/Crowns/Bridges  Complete and partial dentures [phone] [place], [place] Patients must complete an application for services.  There is often a waiting list.

## 2015-09-16 ENCOUNTER — Encounter (HOSPITAL_COMMUNITY): Payer: Self-pay | Admitting: Emergency Medicine

## 2015-09-16 ENCOUNTER — Emergency Department (HOSPITAL_COMMUNITY)
Admission: EM | Admit: 2015-09-16 | Discharge: 2015-09-16 | Disposition: A | Discharge status: Home / Self Care | Payer: Self-pay | Attending: Emergency Medicine | Admitting: Emergency Medicine

## 2015-09-16 DIAGNOSIS — I10 Essential (primary) hypertension: Secondary | ICD-10-CM | POA: Insufficient documentation

## 2015-09-16 DIAGNOSIS — N921 Excessive and frequent menstruation with irregular cycle: Secondary | ICD-10-CM

## 2015-09-16 DIAGNOSIS — N920 Excessive and frequent menstruation with regular cycle: Secondary | ICD-10-CM | POA: Insufficient documentation

## 2015-09-16 DIAGNOSIS — D649 Anemia, unspecified: Secondary | ICD-10-CM | POA: Insufficient documentation

## 2015-09-16 LAB — CBC
HCT: 23.9 % — ABNORMAL LOW (ref 36.0–46.0)
Hemoglobin: 7.2 g/dL — ABNORMAL LOW (ref 12.0–15.0)
MCH: 27.2 pg (ref 26.0–34.0)
MCHC: 30.1 g/dL (ref 30.0–36.0)
MCV: 90.2 fL (ref 78.0–100.0)
PLATELETS: 254 10*3/uL (ref 150–400)
RBC: 2.65 MIL/uL — ABNORMAL LOW (ref 3.87–5.11)
RDW: 15.3 % (ref 11.5–15.5)
WBC: 4.1 10*3/uL (ref 4.0–10.5)

## 2015-09-16 LAB — URINALYSIS, ROUTINE W REFLEX MICROSCOPIC
BILIRUBIN URINE: NEGATIVE
GLUCOSE, UA: NEGATIVE mg/dL
Hgb urine dipstick: NEGATIVE
KETONES UR: NEGATIVE mg/dL
Leukocytes, UA: NEGATIVE
Nitrite: NEGATIVE
PH: 7 (ref 5.0–8.0)
Protein, ur: NEGATIVE mg/dL
SPECIFIC GRAVITY, URINE: 1.014 (ref 1.005–1.030)

## 2015-09-16 LAB — BASIC METABOLIC PANEL
Anion gap: 5 (ref 5–15)
BUN: 9 mg/dL (ref 6–20)
CALCIUM: 8.3 mg/dL — AB (ref 8.9–10.3)
CHLORIDE: 105 mmol/L (ref 101–111)
CO2: 25 mmol/L (ref 22–32)
CREATININE: 0.77 mg/dL (ref 0.44–1.00)
GFR calc Af Amer: >60 mL/min (ref >60)
Glucose, Bld: 113 mg/dL — ABNORMAL HIGH (ref 65–99)
Potassium: 3.3 mmol/L — ABNORMAL LOW (ref 3.5–5.1)
SODIUM: 135 mmol/L (ref 135–145)

## 2015-09-16 MED ORDER — POTASSIUM CHLORIDE 20 MEQ/15ML (10%) PO SOLN
40.0000 meq | Freq: Two times a day (BID) | ORAL | Status: DC
  Administered 2015-09-16: 40 meq via ORAL
  Filled 2015-09-16: qty 30

## 2015-09-16 MED ORDER — ONDANSETRON 4 MG PO TBDP
4.0000 mg | ORAL_TABLET | Freq: Once | ORAL | Status: AC
  Administered 2015-09-16: 4 mg via ORAL
  Filled 2015-09-16: qty 1

## 2015-09-16 NOTE — ED Provider Notes (Signed)
[facility] DEPT Provider Note   CSN: [id number] Arrival date & time: 09/16/15  1504  First Provider Contact:  None       History   Chief Complaint Chief Complaint  Patient presents with  . Dizziness  . Vaginal Bleeding    HPI Nina Hopkins is a 55 y.o. female.  HPI  Patient with history of anemia due to menorrhagia (last year) and iron deficiency presents for dizziness.  She had a heavy menstrual cycle for 23 days that ended yesterday.  She also reports some right sided lower abdominal cramping with this, worse in the past day. This morning, she noticed the pain, as well as some fatigue and dizziness.  She denies urinary symptoms, last BM this morning, no fevers/chills.  Past Medical History:  Diagnosis Date  . Anemia   . Anxiety ~ 2008   "nothing since ~ 2008" (12/20/2014)  . Family history of adverse reaction to anesthesia    "my sister had a hard time waking up"  . History of blood transfusion 12/20/2014   related to anemia  . Hypertension     Patient Active Problem List   Diagnosis Date Noted  . Iron deficiency anemia due to chronic blood loss 12/21/2014  . Menorrhagia 12/21/2014  . Symptomatic anemia 12/21/2014  . Back pain, thoracic 12/21/2014  . Anemia 12/20/2014  . Dyspnea 12/20/2014  . HTN (hypertension) 12/20/2014  . Back pain 12/20/2014  . Heavy menses 12/20/2014    Past Surgical History:  Procedure Laterality Date  . TUBAL LIGATION  1990's    OB History    No data available       Home Medications    Prior to Admission medications   Medication Sig Start Date End Date Taking? Authorizing Provider  ferrous sulfate 325 (65 FE) MG tablet Take 1 tablet (325 mg total) by mouth 3 (three) times daily with meals. 12/21/14   Nishant Dhungel, MD  hydrochlorothiazide (HYDRODIURIL) 25 MG tablet Take 1 tablet (25 mg total) by mouth daily. 01/08/15   Micheline Chapman, NP    Family History History reviewed. No pertinent family history.  Social  History Social History  Substance Use Topics  . Smoking status: Never Smoker  . Smokeless tobacco: Never Used  . Alcohol use No     Comment: 12/20/2014 "might drink 3 times/year tops"     Allergies   Prilosec [omeprazole]   Review of Systems Review of Systems  Constitutional: Positive for fatigue. Negative for chills and fever.  HENT: Negative for ear pain and sore throat.   Eyes: Negative for pain and visual disturbance.  Respiratory: Negative for cough and shortness of breath.   Cardiovascular: Negative for chest pain and palpitations.  Gastrointestinal: Negative for abdominal pain and vomiting.  Genitourinary: Positive for menstrual problem (extended length / heavy) and vaginal discharge. Negative for dysuria and hematuria.  Musculoskeletal: Negative for arthralgias and back pain.  Skin: Negative for color change and rash.  Neurological: Positive for light-headedness. Negative for seizures and syncope.  All other systems reviewed and are negative.    Physical Exam Updated Vital Signs LMP  (Within Days)   Physical Exam  Constitutional: She appears well-developed and well-nourished. No distress.  HENT:  Head: Normocephalic and atraumatic.  Eyes: Conjunctivae are normal.  Neck: Neck supple.  Cardiovascular: Normal rate and regular rhythm.   No murmur heard. Pulmonary/Chest: Effort normal and breath sounds normal. No respiratory distress.  Abdominal: Soft. There is no tenderness.  Genitourinary: Uterus is not enlarged.  Cervix exhibits no motion tenderness and no discharge. Right adnexum displays no mass and no tenderness. Left adnexum displays no mass and no tenderness. No erythema, tenderness or bleeding in the vagina. No vaginal discharge found.  Musculoskeletal: She exhibits no edema.  Neurological: She is alert.  Skin: Skin is warm and dry.  Psychiatric: She has a normal mood and affect.  Nursing note and vitals reviewed.    ED Treatments / Results  Labs (all  labs ordered are listed, but only abnormal results are displayed) Labs Reviewed  BASIC METABOLIC PANEL - Abnormal; Notable for the following:       Result Value   Potassium 3.3 (*)    Glucose, Bld 113 (*)    Calcium 8.3 (*)    All other components within normal limits  CBC - Abnormal; Notable for the following:    RBC 2.65 (*)    Hemoglobin 7.2 (*)    HCT 23.9 (*)    All other components within normal limits  URINALYSIS, ROUTINE W REFLEX MICROSCOPIC (NOT AT [facility])    EKG  EKG Interpretation None       Radiology No results found.  Procedures Procedures (including critical care time)  Medications Ordered in ED Medications - No data to display   Initial Impression / Assessment and Plan / ED Course  I have reviewed the triage vital signs and the nursing notes.  Pertinent labs & imaging results that were available during my care of the patient were reviewed by me and considered in my medical decision making (see chart for details).  Clinical Course    Patient overall appears well here.  On exam she has no further signs of bleeding. No adnexal tenderness to suggest ovarian pathology.  No abdominal tenderness.  Labs revealed a low Hb, likely due to her prolonged period.  She is asymptomatic here.  In the setting of her no longer being on her menstrual cycle, and asymptomatic here, will encourage PCP followup for Hb recheck.  I have also encouraged her to f/u with GYN regarding menorrhagia.  She had a mild elevation of temperature here, but no tachycardia or other signs of infection.    We have discussed the discharge plan, including the plan for outpatient followup, and strict return precautions, including those that would require calling 911.     Final Clinical Impressions(s) / ED Diagnoses   Final diagnoses:  None    New Prescriptions New Prescriptions   No medications on file     Levada Schilling, MD 09/16/15 Elenora Gamma, MD 09/16/15 2011

## 2015-09-16 NOTE — ED Triage Notes (Signed)
Reports vaginal bleeding for 23 straight days, now feels dizzy and weak. Nauseated also.

## 2015-09-25 ENCOUNTER — Other Ambulatory Visit: Payer: Self-pay | Admitting: Obstetrics and Gynecology

## 2015-09-25 DIAGNOSIS — Z1231 Encounter for screening mammogram for malignant neoplasm of breast: Secondary | ICD-10-CM

## 2015-10-04 ENCOUNTER — Ambulatory Visit (HOSPITAL_COMMUNITY): Payer: Self-pay

## 2015-10-17 ENCOUNTER — Emergency Department (HOSPITAL_COMMUNITY)
Admission: EM | Admit: 2015-10-17 | Discharge: 2015-10-17 | Disposition: A | Discharge status: Home / Self Care | Payer: Self-pay | Attending: Emergency Medicine | Admitting: Emergency Medicine

## 2015-10-17 ENCOUNTER — Encounter (HOSPITAL_COMMUNITY): Payer: Self-pay

## 2015-10-17 DIAGNOSIS — J069 Acute upper respiratory infection, unspecified: Secondary | ICD-10-CM | POA: Insufficient documentation

## 2015-10-17 DIAGNOSIS — I10 Essential (primary) hypertension: Secondary | ICD-10-CM | POA: Insufficient documentation

## 2015-10-17 LAB — CBC
HCT: 24.6 % — ABNORMAL LOW (ref 36.0–46.0)
Hemoglobin: 7.1 g/dL — ABNORMAL LOW (ref 12.0–15.0)
MCH: 24.1 pg — ABNORMAL LOW (ref 26.0–34.0)
MCHC: 28.9 g/dL — ABNORMAL LOW (ref 30.0–36.0)
MCV: 83.7 fL (ref 78.0–100.0)
PLATELETS: 341 10*3/uL (ref 150–400)
RBC: 2.94 MIL/uL — AB (ref 3.87–5.11)
RDW: 14.5 % (ref 11.5–15.5)
WBC: 6.4 10*3/uL (ref 4.0–10.5)

## 2015-10-17 LAB — COMPREHENSIVE METABOLIC PANEL
ALK PHOS: 47 U/L (ref 38–126)
ALT: 21 U/L (ref 14–54)
AST: 21 U/L (ref 15–41)
Albumin: 3.2 g/dL — ABNORMAL LOW (ref 3.5–5.0)
Anion gap: 4 — ABNORMAL LOW (ref 5–15)
BILIRUBIN TOTAL: 0.3 mg/dL (ref 0.3–1.2)
BUN: 7 mg/dL (ref 6–20)
CALCIUM: 8.7 mg/dL — AB (ref 8.9–10.3)
CO2: 26 mmol/L (ref 22–32)
CREATININE: 0.67 mg/dL (ref 0.44–1.00)
Chloride: 110 mmol/L (ref 101–111)
GFR calc Af Amer: >60 mL/min (ref >60)
Glucose, Bld: 98 mg/dL (ref 65–99)
Potassium: 3.8 mmol/L (ref 3.5–5.1)
Sodium: 140 mmol/L (ref 135–145)
TOTAL PROTEIN: 6.9 g/dL (ref 6.5–8.1)

## 2015-10-17 LAB — LIPASE, BLOOD: LIPASE: 25 U/L (ref 11–51)

## 2015-10-17 LAB — URINALYSIS, ROUTINE W REFLEX MICROSCOPIC
Bilirubin Urine: NEGATIVE
GLUCOSE, UA: NEGATIVE mg/dL
Hgb urine dipstick: NEGATIVE
KETONES UR: NEGATIVE mg/dL
LEUKOCYTES UA: NEGATIVE
Nitrite: NEGATIVE
Protein, ur: NEGATIVE mg/dL
Specific Gravity, Urine: 1.025 (ref 1.005–1.030)
pH: 6.5 (ref 5.0–8.0)

## 2015-10-17 LAB — RAPID STREP SCREEN (MED CTR MEBANE ONLY): STREPTOCOCCUS, GROUP A SCREEN (DIRECT): NEGATIVE

## 2015-10-17 NOTE — ED Triage Notes (Signed)
Patient complains of right flank pain and side pain for several days. Was seen for vaginal bleeding that has resolved but complains of ongoing pain. Now has congestion, sore throat and cough

## 2015-10-17 NOTE — ED Provider Notes (Signed)
[place] DEPT Provider Note   CSN: [id number] Arrival date & time: 10/17/15  1322     History   Chief Complaint Chief Complaint  Patient presents with  . Flank Pain  . Sore Throat    HPI Nina Hopkins is a 55 y.o. female with pmhx of anxiety, HTN and anemia presenting with sore throat. States that she has been having subjective fever as well. She indicates being congested and feeling weak. Denies any congestion currently. Also indicates having lower back pain for about 6 weeks. She says states she has been having intermittent to constant. Patient has taken Advil for the lower back pain which has helped. Patient states certain movements makes it worse. Does not radiate anywhere. No numbness or tingling associated with it. Patient denies dysuria or frequency.   HPI  Past Medical History:  Diagnosis Date  . Anemia   . Anxiety ~ 2008   "nothing since ~ 2008" (12/20/2014)  . Family history of adverse reaction to anesthesia    "my sister had a hard time waking up"  . History of blood transfusion 12/20/2014   related to anemia  . Hypertension     Patient Active Problem List   Diagnosis Date Noted  . Iron deficiency anemia due to chronic blood loss 12/21/2014  . Menorrhagia 12/21/2014  . Symptomatic anemia 12/21/2014  . Back pain, thoracic 12/21/2014  . Anemia 12/20/2014  . Dyspnea 12/20/2014  . HTN (hypertension) 12/20/2014  . Back pain 12/20/2014  . Heavy menses 12/20/2014    Past Surgical History:  Procedure Laterality Date  . TUBAL LIGATION  1990's    OB History    No data available       Home Medications    Prior to Admission medications   Medication Sig Start Date End Date Taking? Authorizing Provider  ferrous sulfate 325 (65 FE) MG tablet Take 1 tablet (325 mg total) by mouth 3 (three) times daily with meals. 12/21/14   Nishant Dhungel, MD  hydrochlorothiazide (HYDRODIURIL) 25 MG tablet Take 1 tablet (25 mg total) by mouth daily. 01/08/15   Micheline Chapman, NP    Family History No family history on file.  Social History Social History  Substance Use Topics  . Smoking status: Never Smoker  . Smokeless tobacco: Never Used  . Alcohol use No     Comment: 12/20/2014 "might drink 3 times/year tops"     Allergies   Prilosec [omeprazole]   Review of Systems Review of Systems  Constitutional: Positive for chills. Negative for fever.  HENT: Positive for congestion and sore throat. Negative for ear discharge and ear pain.   Eyes: Negative for pain and discharge.  Respiratory: Negative for cough and shortness of breath.   Cardiovascular: Negative for chest pain.  Gastrointestinal: Negative for abdominal pain, diarrhea, nausea and vomiting.  Genitourinary: Negative for dysuria and frequency.  Musculoskeletal: Positive for back pain. Negative for gait problem.  Skin: Negative for color change.  Neurological: Positive for weakness and headaches.     Physical Exam Updated Vital Signs BP 130/82 (BP Location: Right Arm)   Pulse 77   Temp 97.8 F (36.6 C) (Oral)   Resp 16   Ht 5\' 1"  (1.549 m)   Wt 78.9 kg   SpO2 100%   BMI 32.88 kg/m   Physical Exam  Constitutional: She is oriented to person, place, and time. She appears well-developed and well-nourished.  HENT:  Head: Normocephalic and atraumatic.  Right Ear: External ear normal.  Left  Ear: External ear normal.  Nose: Nose normal.  Mouth/Throat: Posterior oropharyngeal erythema present. No oropharyngeal exudate or tonsillar abscesses.  Eyes: Conjunctivae are normal.  Neck: Normal range of motion. Neck supple.  Cardiovascular: Normal rate, regular rhythm and normal heart sounds.   Pulmonary/Chest: Effort normal and breath sounds normal.  Abdominal: Soft. Bowel sounds are normal.  Musculoskeletal: Normal range of motion.  Lymphadenopathy:    She has cervical adenopathy.  Neurological: She is alert and oriented to person, place, and time.  Skin: Skin is warm and  dry.     ED Treatments / Results  Labs (all labs ordered are listed, but only abnormal results are displayed) Labs Reviewed  COMPREHENSIVE METABOLIC PANEL - Abnormal; Notable for the following:       Result Value   Calcium 8.7 (*)    Albumin 3.2 (*)    Anion gap 4 (*)    All other components within normal limits  CBC - Abnormal; Notable for the following:    RBC 2.94 (*)    Hemoglobin 7.1 (*)    HCT 24.6 (*)    MCH 24.1 (*)    MCHC 28.9 (*)    All other components within normal limits  RAPID STREP SCREEN (NOT AT [facility])  CULTURE, GROUP A STREP ([place])  LIPASE, BLOOD  URINALYSIS, ROUTINE W REFLEX MICROSCOPIC (NOT AT [facility])    EKG  EKG Interpretation None       Radiology No results found.  Procedures Procedures (including critical care time)  Medications Ordered in ED Medications - No data to display   Initial Impression / Assessment and Plan / ED Course  I have reviewed the triage vital signs and the nursing notes.  Pertinent labs & imaging results that were available during my care of the patient were reviewed by me and considered in my medical decision making (see chart for details).  Clinical Course    Patient presenting for 1 week of sore throat, chills and congestion. Agent was negative for for rapid strep. She also noted to have lower back pain for about one month, likely musculoskeletal. UA negative today for possible UTI. Patient was noted to have a low hemoglobin on her CBC this is stable low for her. Patient was advised to see OB/GYN as she has heavy bleeding with her menstrual cycles and was told to continue to take iron supplements  Final Clinical Impressions(s) / ED Diagnoses   Final diagnoses:  URI (upper respiratory infection)    New Prescriptions Discharge Medication List as of 10/17/2015  9:16 PM       Aniesa Boback Cletis Media, MD 10/17/15 2145    Isla Pence, MD 10/17/15 2304

## 2015-10-17 NOTE — Discharge Instructions (Addendum)
Your strep was negative and your urine looks fine. You likely have respiratory viral illness and should begin to improve in the next couple of days. I want you to follow up with an OBGYN, they discounted rates for no insurance. Please continue taking your iron as your hemoglobin is low.

## 2015-10-20 LAB — CULTURE, GROUP A STREP (THRC)

## 2015-10-26 ENCOUNTER — Encounter (HOSPITAL_COMMUNITY): Payer: Self-pay

## 2015-10-26 ENCOUNTER — Ambulatory Visit (HOSPITAL_COMMUNITY)
Admission: RE | Admit: 2015-10-26 | Discharge: 2015-10-26 | Disposition: A | Discharge status: Home / Self Care | Payer: Self-pay | Source: Ambulatory Visit | Attending: Obstetrics and Gynecology | Admitting: Obstetrics and Gynecology

## 2015-10-26 ENCOUNTER — Ambulatory Visit
Admission: RE | Admit: 2015-10-26 | Discharge: 2015-10-26 | Disposition: A | Discharge status: Home / Self Care | Payer: No Typology Code available for payment source | Source: Ambulatory Visit | Attending: Obstetrics and Gynecology | Admitting: Obstetrics and Gynecology

## 2015-10-26 VITALS — BP 150/90 | Temp 98.1°F | Ht 61.0 in | Wt 179.0 lb

## 2015-10-26 DIAGNOSIS — Z01419 Encounter for gynecological examination (general) (routine) without abnormal findings: Secondary | ICD-10-CM

## 2015-10-26 DIAGNOSIS — Z1231 Encounter for screening mammogram for malignant neoplasm of breast: Secondary | ICD-10-CM

## 2015-10-26 NOTE — Patient Instructions (Addendum)
Explained breast self awareness with Chalee Horrell. Let her know [facility] will cover Pap smears and HPV typing every 5 years unless has a history of abnormal Pap smears. Referred patient to the [facility] for a screening mammogram. Appointment scheduled for Friday, October 26, 2015 at 1210. Let patient know will follow up with her within the next couple weeks with results and follow up appointment at the [facility] by phone. Informed patient that the Breast Center will follow up with her within the next couple of weeks with results of mammogram by letter or phone. Nina Hopkins verbalized understanding.  Zya Finkle, Arvil Chaco, RN 12:06 PM

## 2015-10-26 NOTE — Progress Notes (Signed)
Complaints of AUB. Patient stated she had heavy bleeding for 31 days that she had lots of clots. Patient stated she only stopped bleeding for 2 weeks before she started bleeding again. Patient states that she never knows when she will start bleeding. Patient went to the Emergency Department on 10/17/2015 and her hemoglobin was 7.1. Have [facility] to [facility] for follow up.  Pap Smear: Pap smear completed today. Last Pap smear was 5 or 6 years ago in [place] and normal per patient. Per patient has a history of an abnormal Pap smear when she was a teenager that cryotherapy was completed for follow up. Per patient all Pap smears have been normal since the cryotherapy.   Physical exam: Breasts Breasts symmetrical. No skin abnormalities bilateral breasts. No nipple retraction bilateral breasts. No nipple discharge bilateral breasts. No lymphadenopathy. No lumps palpated bilateral breasts. No complaints of pain or tenderness on exam. Referred patient to the [facility] [facility] for a screening mammogram. Appointment scheduled for Friday, October 26, 2015 at 1210.  Pelvic/Bimanual   Ext Genitalia No lesions, no swelling and no discharge observed on external genitalia.         Vagina Vagina pink and normal texture. No lesions and a watery yellowish color discharge observed in vagina.          Cervix Cervix is present. Cervix pink and of normal texture. Cervix tilted to the right. No discharge observed.     Uterus Uterus is present and palpable. Uterus in slightly tilted to the right and slightly enlarged.     Adnexae Bilateral ovaries present and palpable. No tenderness on palpation.          Rectovaginal No rectal exam completed today since patient had no rectal complaints. No skin abnormalities observed on exam.    Smoking History: Patient has never smoked.  Patient Navigation: Patient education provided. Access to services  provided for patient through [facility] program.   Colorectal Cancer Screening: Patient has never had a colonoscopy. No complaints today.

## 2015-10-30 ENCOUNTER — Encounter (HOSPITAL_COMMUNITY): Payer: Self-pay | Admitting: *Deleted

## 2015-11-02 ENCOUNTER — Encounter: Payer: Self-pay | Admitting: *Deleted

## 2015-11-02 ENCOUNTER — Telehealth (HOSPITAL_COMMUNITY): Payer: Self-pay | Admitting: *Deleted

## 2015-11-02 NOTE — Telephone Encounter (Signed)
Telephoned patient at home number and gave patient information about upcoming appointment at [facility] Thursday October 19 8:20. Patient voiced understanding.

## 2015-11-22 ENCOUNTER — Other Ambulatory Visit (HOSPITAL_COMMUNITY)
Admission: RE | Admit: 2015-11-22 | Discharge: 2015-11-22 | Disposition: A | Discharge status: Home / Self Care | Payer: Self-pay | Source: Ambulatory Visit | Attending: Obstetrics and Gynecology | Admitting: Obstetrics and Gynecology

## 2015-11-22 ENCOUNTER — Encounter: Payer: Self-pay | Admitting: Obstetrics and Gynecology

## 2015-11-22 ENCOUNTER — Ambulatory Visit (INDEPENDENT_AMBULATORY_CARE_PROVIDER_SITE_OTHER): Payer: Self-pay | Admitting: Clinical

## 2015-11-22 ENCOUNTER — Ambulatory Visit (INDEPENDENT_AMBULATORY_CARE_PROVIDER_SITE_OTHER): Payer: Self-pay | Admitting: Obstetrics and Gynecology

## 2015-11-22 VITALS — BP 143/96 | HR 69 | Ht 61.0 in | Wt 175.0 lb

## 2015-11-22 DIAGNOSIS — N939 Abnormal uterine and vaginal bleeding, unspecified: Secondary | ICD-10-CM

## 2015-11-22 DIAGNOSIS — Z3202 Encounter for pregnancy test, result negative: Secondary | ICD-10-CM

## 2015-11-22 DIAGNOSIS — F4322 Adjustment disorder with anxiety: Secondary | ICD-10-CM

## 2015-11-22 LAB — POCT PREGNANCY, URINE: Preg Test, Ur: NEGATIVE

## 2015-11-22 MED ORDER — MEDROXYPROGESTERONE ACETATE 10 MG PO TABS: ORAL_TABLET | ORAL | 1 refills | Status: DC

## 2015-11-22 NOTE — Procedures (Signed)
Endometrial Biopsy Procedure Note  Pre-operative Diagnosis: AUB, anemia  Post-operative Diagnosis: same  Procedure Details  Urine pregnancy test was done and result was negative.  The risks (including infection, bleeding, pain, and uterine perforation) and benefits of the procedure were explained to the patient and Written informed consent was obtained.  The patient was placed in the dorsal lithotomy position.  Bimanual exam showed the uterus to be in the neutral position and approximately 12-14wk sized. A Graves' speculum inserted in the vagina, and the cervix was visualized and the cervix and vaginal vault cleaned of all the old blood (approximately 59mL). The cervix was then prepped with povidone iodine, and a sharp tenaculum was applied to the anterior lip of the cervix for stabilization.  A pipelle was inserted into the uterine cavity and sounded the uterus to a depth of 10.5-11cm.  A Moderate amount of tissue was collected after 3 passes with moderate amount of blood clot mixed in, too. The sample was sent for pathologic examination.  Condition: Stable  Complications: None   Plan: The patient was advised to call for any fever or for prolonged or severe pain or bleeding. She was advised to use OTC analgesics as needed for mild to moderate pain. She was advised to avoid vaginal intercourse for 48 hours or until the bleeding has completely stopped.  Durene Romans MD Attending Center for [employer] Fish farm manager)

## 2015-11-22 NOTE — BH Specialist Note (Signed)
Session Start time: 8:55 End Time: 9:20 Total Time:  25 Type of Service: Behavioral Health - Individual/Family Interpreter: No.   Interpreter Name & Language: n/a # [facility] Visits July 2017-June 2018: 1st   SUBJECTIVE: Nina Hopkins is a 55 y.o. female  Pt. was referred by Aletha Halim, MD for:  depression. Pt. reports the following symptoms/concerns: Pt states that she is feeling nervous about health and healthcare access, as well as living between her two daughters' homes; "I just deal with it" to cope; open to additional coping strategies. 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  Pt may benefit from psychoeducation and brief therapeutic intervention regarding coping with symptoms of anxiety.      PLAN: 1. F/U with behavioral health clinician: One month or as needed 2. Behavioral Health meds: none 3. Behavioral recommendations:  -Practice daily CALM relaxation breathing -Read educational material regarding coping with symptoms of anxiety -Consider calming apps for additional self-care strategy 4. Referral: Brief Counseling/Psychotherapy and Psychoeducation 5. From scale of 1-10, how likely are you to follow plan: [place] Clinician   Warm Hand Off Completed.      Depression screen [facility] 2/9 11/22/2015 01/08/2015  Decreased Interest 0 0  Down, Depressed, Hopeless 0 0  PHQ - 2 Score 0 0  Altered sleeping 0 -  Tired, decreased energy 2 -  Change in appetite 0 -  Feeling bad or failure about yourself  0 -  Trouble concentrating 2 -  Moving slowly or fidgety/restless 0 -  Suicidal thoughts 0 -  PHQ-9 Score 4 -   GAD 7 : Generalized Anxiety Score 11/22/2015  Nervous, Anxious, on Edge 3  Control/stop worrying 3  Worry too much - different things 3  Trouble relaxing 2  Restless 0  Easily annoyed or irritable 0  Afraid - awful might happen 2  Total GAD 7 Score 13
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
## 2015-11-26 ENCOUNTER — Encounter (HOSPITAL_COMMUNITY): Payer: Self-pay | Admitting: *Deleted

## 2015-11-29 ENCOUNTER — Ambulatory Visit (HOSPITAL_COMMUNITY)
Admission: RE | Admit: 2015-11-29 | Discharge: 2015-11-29 | Disposition: A | Discharge status: Home / Self Care | Payer: Self-pay | Source: Ambulatory Visit | Attending: Obstetrics and Gynecology | Admitting: Obstetrics and Gynecology

## 2015-11-29 DIAGNOSIS — N939 Abnormal uterine and vaginal bleeding, unspecified: Secondary | ICD-10-CM | POA: Insufficient documentation

## 2015-11-29 DIAGNOSIS — D251 Intramural leiomyoma of uterus: Secondary | ICD-10-CM | POA: Insufficient documentation

## 2015-11-29 DIAGNOSIS — D252 Subserosal leiomyoma of uterus: Secondary | ICD-10-CM | POA: Insufficient documentation

## 2015-11-29 DIAGNOSIS — N83292 Other ovarian cyst, left side: Secondary | ICD-10-CM | POA: Insufficient documentation

## 2015-11-29 DIAGNOSIS — D649 Anemia, unspecified: Secondary | ICD-10-CM | POA: Insufficient documentation

## 2015-12-03 NOTE — Addendum Note (Signed)
Encounter addended by: Loletta Parish, RN on: 12/03/2015 11:25 AM<BR>    Actions taken: Charge Capture section accepted

## 2015-12-04 ENCOUNTER — Telehealth: Payer: Self-pay | Admitting: Obstetrics and Gynecology

## 2015-12-04 NOTE — Telephone Encounter (Signed)
GYN Telephone Note 12/04/2015  1542  D/w pt re: negative embx and pap results and findings of fibroid uterus on u/s (intramural) but otherwise benign. Pt states she's never had climateric s/s and her periods have always been qmonth and regular but have gone from 7d to 10-14d and heavier than what they used to be; she is working on Public librarian. She is interested in a hysterectomy and would like that and I told her that I will see if the clinic has any financial assistance programs, as she works on it from her end. I also told her that medical management is also an option and is the best option, right now, for her, given her financial difficulties. I told her to take the provera 10mg  q6h during her period and continue it until her period stops to hopefully make them not as heavy; she hasn't filled the script yet that I gave her in clinic and she's had no bleeding since then.  I also told her about the bcccp pap smear and to repeat that in 3 years and about some resources in the community for screening colonoscopy.  Pt amenable to plan  Durene Romans MD Attending Center for [facility])

## 2015-12-05 ENCOUNTER — Telehealth: Payer: Self-pay | Admitting: Obstetrics and Gynecology

## 2015-12-05 ENCOUNTER — Encounter: Payer: Self-pay | Admitting: Obstetrics and Gynecology

## 2015-12-05 NOTE — Telephone Encounter (Signed)
Called patient about needing to fill out a financial application. Left a message on her voicemail to call so I could give her this information. Also sending application in mail.

## 2016-10-01 ENCOUNTER — Encounter (HOSPITAL_COMMUNITY): Payer: Self-pay | Admitting: Emergency Medicine

## 2016-10-01 ENCOUNTER — Emergency Department (HOSPITAL_COMMUNITY)
Admission: EM | Admit: 2016-10-01 | Discharge: 2016-10-02 | Disposition: A | Discharge status: Home / Self Care | Payer: Self-pay | Attending: Emergency Medicine | Admitting: Emergency Medicine

## 2016-10-01 ENCOUNTER — Emergency Department (HOSPITAL_COMMUNITY): Payer: Self-pay

## 2016-10-01 DIAGNOSIS — M546 Pain in thoracic spine: Secondary | ICD-10-CM | POA: Insufficient documentation

## 2016-10-01 DIAGNOSIS — I10 Essential (primary) hypertension: Secondary | ICD-10-CM | POA: Insufficient documentation

## 2016-10-01 DIAGNOSIS — R0602 Shortness of breath: Secondary | ICD-10-CM | POA: Insufficient documentation

## 2016-10-01 LAB — BASIC METABOLIC PANEL
ANION GAP: 8 (ref 5–15)
BUN: 10 mg/dL (ref 6–20)
CALCIUM: 8.9 mg/dL (ref 8.9–10.3)
CHLORIDE: 106 mmol/L (ref 101–111)
CO2: 25 mmol/L (ref 22–32)
CREATININE: 0.82 mg/dL (ref 0.44–1.00)
GFR calc Af Amer: >60 mL/min (ref >60)
GFR calc non Af Amer: >60 mL/min (ref >60)
GLUCOSE: 114 mg/dL — AB (ref 65–99)
Potassium: 3.8 mmol/L (ref 3.5–5.1)
Sodium: 139 mmol/L (ref 135–145)

## 2016-10-01 LAB — CBC
HEMATOCRIT: 39.9 % (ref 36.0–46.0)
HEMOGLOBIN: 12 g/dL (ref 12.0–15.0)
MCH: 24.9 pg — ABNORMAL LOW (ref 26.0–34.0)
MCHC: 30.1 g/dL (ref 30.0–36.0)
MCV: 82.8 fL (ref 78.0–100.0)
Platelets: 232 10*3/uL (ref 150–400)
RBC: 4.82 MIL/uL (ref 3.87–5.11)
RDW: 19.9 % — AB (ref 11.5–15.5)
WBC: 4.9 10*3/uL (ref 4.0–10.5)

## 2016-10-01 MED ORDER — IBUPROFEN 800 MG PO TABS
800.0000 mg | ORAL_TABLET | Freq: Once | ORAL | Status: AC
  Administered 2016-10-02: 800 mg via ORAL
  Filled 2016-10-01: qty 1

## 2016-10-01 NOTE — ED Triage Notes (Signed)
Reports having mid upper back pain for about two weeks and having SOB when trying to take a deep breath since yesterday.  Reports feeling this way before when she needed a blood transfusion.  Breathing easy and nonlabored in triage.

## 2016-10-01 NOTE — ED Provider Notes (Signed)
TIME SEEN: 11:44 PM  CHIEF COMPLAINT: right thoracic back pain  HPI: Patient is a 56 year old female with history of anxiety, anemia likely related to heavy menstrual cycles who has had a blood transfusion, hypertension who presents emergency department with complaints of right upper back pain for the past 2 weeks. Pain is achy in nature and sometimes burning. It is not worse with movement or palpation. She states she feels like it is "deep". She does feel like it hurts some when she takes a deep breath. She has felt short of breath. States the symptoms felt similar to when she had to have a previous blood transfusion. She reports her last menstrual cycle was June 28. No history of PE or DVT. No lower extremity swelling or pain. Pain does radiate underneath right breast. No breast changes, masses, nipple discharge. She denies any bloody stools, melena, abdominal pain, fevers, cough, nausea, vomiting. She does not currently have a primary care provider.  ROS: See HPI Constitutional: no fever  Eyes: no drainage  ENT: no runny nose   Cardiovascular:  no chest pain  Resp: no SOB  GI: no vomiting GU: no dysuria Integumentary: no rash  Allergy: no hives  Musculoskeletal: no leg swelling  Neurological: no slurred speech ROS otherwise negative  PAST MEDICAL HISTORY/PAST SURGICAL HISTORY:  Past Medical History:  Diagnosis Date  . Anemia   . Anxiety ~ 2008   "nothing since ~ 2008" (12/20/2014)  . Family history of adverse reaction to anesthesia    "my sister had a hard time waking up"  . History of blood transfusion 12/20/2014   related to anemia  . Hypertension     MEDICATIONS:  Prior to Admission medications   Medication Sig Start Date End Date Taking? Authorizing Provider  ferrous sulfate 325 (65 FE) MG tablet Take 1 tablet (325 mg total) by mouth 3 (three) times daily with meals. 12/21/14   Dhungel, Flonnie Overman, MD  hydrochlorothiazide (HYDRODIURIL) 25 MG tablet Take 1 tablet (25 mg total)  by mouth daily. 01/08/15   Micheline Chapman, NP  medroxyPROGESTERone (PROVERA) 10 MG tablet 10mg  po q6h x 3d. 10mg  po q12h x 2d. 10mg  po qday until follow up phone call. 11/22/15   Aletha Halim, MD  Multiple Vitamins-Minerals (MULTIVITAMIN WITH MINERALS) tablet Take 1 tablet by mouth daily.    [provider]    ALLERGIES:  Allergies  Allergen Reactions  . Prilosec [Omeprazole]     Stiff neck    SOCIAL HISTORY:  Social History  Substance Use Topics  . Smoking status: Never Smoker  . Smokeless tobacco: Never Used  . Alcohol use Yes     Comment: occasionally     FAMILY HISTORY: Family History  Problem Relation Age of Onset  . Cancer Mother        BRAIN TUMOR, VULVAR  . Hypertension Mother   . Diabetes Mother   . Cancer Father        PENILE  . Cancer Maternal Grandmother        LUNG  . Cancer Maternal Grandfather        COLON    EXAM: BP (!) 178/101 (BP Location: Right Arm)   Pulse 77   Temp 98.6 F (37 C) (Oral)   Resp 18   Ht 5\' 1"  (1.549 m)   Wt 88 kg (194 lb)   SpO2 99%   BMI 36.66 kg/m  CONSTITUTIONAL: Alert and oriented and responds appropriately to questions. Well-appearing; well-nourished HEAD: Normocephalic EYES: Conjunctivae clear,  pupils appear equal, EOMI ENT: normal nose; moist mucous membranes NECK: Supple, no meningismus, no nuchal rigidity, no LAD  CARD: RRR; S1 and S2 appreciated; no murmurs, no clicks, no rubs, no gallops BREAST:  No breast mass palpated on exam.  Right breast is nontender to palpation.  No erythema, warmth, induration, fluctuance.  No nipple discharge.  No inverted nipple.  No skin changes.  No axillary lymphadenopathy. RESP: Normal chest excursion without splinting or tachypnea; breath sounds clear and equal bilaterally; no wheezes, no rhonchi, no rales, no hypoxia or respiratory distress, speaking full sentences ABD/GI: Normal bowel sounds; non-distended; soft, non-tender, no rebound, no guarding, no peritoneal  signs, no hepatosplenomegaly BACK:  The back appears normal and is non-tender to palpation, there is no CVA tenderness EXT: Normal ROM in all joints; non-tender to palpation; no edema; normal capillary refill; no cyanosis, no calf tenderness or swelling    SKIN: Normal color for age and race; warm; patient does have some redness noted to the right thoracic back there is no warmth and there is no rash, this redness does seem to track along the right lateral ribs underneath the right breast NEURO: Moves all extremities equally, normal sensation diffusely, normal gait, normal speech PSYCH: The patient's mood and manner are appropriate. Grooming and personal hygiene are appropriate.  MEDICAL DECISION MAKING: Patient here with right thoracic back pain that is not reproducible with palpation and shortness of breath. Concern for possible PE. Chest x-ray obtained in triage is unremarkable. She has a normal hemoglobin today of 12. Low suspicion for ACS. No pneumonia seen on chest x-ray or pleural effusion or edema. No history of injury. Pain is not reproducible with palpation. She is neurologically intact. She does have some redness noted along her back but no rash or warmth. It does not look like cellulitis and there is no sign of shingles.pyelonephritis, kidney stone also in the differential. Will obtain urinalysis.  We'll add on d-dimer, troponin and an EKG. Give ibuprofen for patient's pain per her request.  ED PROGRESS: Patient's troponin, d-dimer negative. Urine shows no sign of infection or blood.  Doubt kidney stone or kidney infection. I do not think she needs emergent imaging. She agrees.  She reports no relief in pain after ibuprofen. She states she thinks before she has had similar symptoms and was told it was a muscle spasm and feels Flexeril has helped her before. We will give her a dose here. She is also been noted to be hypertensive. She reports she has been off of her hypertensive medication since  losing insurance several months ago. She is supposed to be on HCTZ 25 mg. We will give her a dose of this as well. We will recheck patient to see if pain and blood pressure have improved.  4:50 AM  Pt's blood pressure has decreased on its own. She unfortunately has not received the HCTZ or Flexeril due to multiple high acuity patients in the emergency department. We have apologized and explain for her weight. Patient is extremely understanding and states that she feels that she would like to go home. She states she has to care for her grandson in the morning and does not want to take any medication that will cause her to be drowsy. I will provide her with prescription for ibuprofen, Flexeril and HCTZ. We'll give her a list of primary care physicians for follow-up.   At this time, I do not feel there is any life-threatening condition present. I have reviewed and  discussed all results (EKG, imaging, lab, urine as appropriate) and exam findings with patient/family. I have reviewed nursing notes and appropriate previous records.  I feel the patient is safe to be discharged home without further emergent workup and can continue workup as an outpatient as needed. Discussed usual and customary return precautions. Patient/family verbalize understanding and are comfortable with this plan.  Outpatient follow-up has been provided if needed. All questions have been answered.    EKG Interpretation  Date/Time:  Thursday October 02 2016 01:12:07 EDT Ventricular Rate:  66 PR Interval:    QRS Duration: 99 QT Interval:  427 QTC Calculation: 448 R Axis:   2 Text Interpretation:  Sinus rhythm Borderline T abnormalities, anterior leads No significant change since last tracing Confirmed by Jameel Quant, Cyril Mourning [phone]) on 10/02/2016 1:15:39 AM          Daniyah Fohl, Delice Bison, DO 10/02/16 4034

## 2016-10-02 ENCOUNTER — Other Ambulatory Visit: Payer: Self-pay

## 2016-10-02 LAB — URINALYSIS, ROUTINE W REFLEX MICROSCOPIC
BILIRUBIN URINE: NEGATIVE
Glucose, UA: NEGATIVE mg/dL
HGB URINE DIPSTICK: NEGATIVE
KETONES UR: NEGATIVE mg/dL
Leukocytes, UA: NEGATIVE
NITRITE: NEGATIVE
PROTEIN: NEGATIVE mg/dL
SPECIFIC GRAVITY, URINE: 1.02 (ref 1.005–1.030)
pH: 6 (ref 5.0–8.0)

## 2016-10-02 LAB — HEPATIC FUNCTION PANEL
ALT: 36 U/L (ref 14–54)
AST: 30 U/L (ref 15–41)
Albumin: 3.7 g/dL (ref 3.5–5.0)
Alkaline Phosphatase: 59 U/L (ref 38–126)
BILIRUBIN DIRECT: 0.1 mg/dL (ref 0.1–0.5)
BILIRUBIN INDIRECT: 0.4 mg/dL (ref 0.3–0.9)
BILIRUBIN TOTAL: 0.5 mg/dL (ref 0.3–1.2)
Total Protein: 7.1 g/dL (ref 6.5–8.1)

## 2016-10-02 LAB — LIPASE, BLOOD: LIPASE: 28 U/L (ref 11–51)

## 2016-10-02 LAB — I-STAT TROPONIN, ED: TROPONIN I, POC: 0 ng/mL (ref 0.00–0.08)

## 2016-10-02 LAB — D-DIMER, QUANTITATIVE (NOT AT ARMC)

## 2016-10-02 MED ORDER — CYCLOBENZAPRINE HCL 10 MG PO TABS: 10.0000 mg | ORAL_TABLET | Freq: Once | ORAL | Status: DC

## 2016-10-02 MED ORDER — IBUPROFEN 800 MG PO TABS: 800.0000 mg | ORAL_TABLET | Freq: Three times a day (TID) | ORAL | 0 refills | Status: DC | PRN

## 2016-10-02 MED ORDER — CYCLOBENZAPRINE HCL 10 MG PO TABS: 10.0000 mg | ORAL_TABLET | Freq: Two times a day (BID) | ORAL | 0 refills | Status: DC | PRN

## 2016-10-02 MED ORDER — HYDROCHLOROTHIAZIDE 25 MG PO TABS: 25.0000 mg | ORAL_TABLET | Freq: Every day | ORAL | 1 refills | Status: DC

## 2016-10-02 MED ORDER — HYDROCHLOROTHIAZIDE 25 MG PO TABS: 25.0000 mg | ORAL_TABLET | Freq: Once | ORAL | Status: DC

## 2016-10-02 NOTE — ED Notes (Signed)
Pt departed in NAD, refused use of wheelchair.  

## 2016-10-02 NOTE — Discharge Instructions (Signed)
To find a primary care or specialty doctor please call [phone] or [phone] to access "[facility] Find a Doctor Service." ° °You may also go on the [facility] website at [email or web address] ° °There are also multiple [facility], [facility] practices throughout the [place] that are frequently accepting new patients. You may find a clinic that is close to your home and contact them. ° °[facility] -  °[address] °[place] [address] [phone] ° ° °[facility] -  °[address] °[place] [address] [phone] ° ° °[facility] - °[address]  °[place] [address] °[phone] ° ° °

## 2016-12-21 ENCOUNTER — Encounter (HOSPITAL_COMMUNITY): Payer: Self-pay | Admitting: Emergency Medicine

## 2016-12-21 ENCOUNTER — Other Ambulatory Visit: Payer: Self-pay

## 2016-12-21 DIAGNOSIS — Z79899 Other long term (current) drug therapy: Secondary | ICD-10-CM | POA: Insufficient documentation

## 2016-12-21 DIAGNOSIS — R000 Tachycardia, unspecified: Secondary | ICD-10-CM | POA: Insufficient documentation

## 2016-12-21 DIAGNOSIS — N939 Abnormal uterine and vaginal bleeding, unspecified: Secondary | ICD-10-CM | POA: Insufficient documentation

## 2016-12-21 DIAGNOSIS — M79605 Pain in left leg: Secondary | ICD-10-CM | POA: Insufficient documentation

## 2016-12-21 DIAGNOSIS — I10 Essential (primary) hypertension: Secondary | ICD-10-CM | POA: Insufficient documentation

## 2016-12-21 NOTE — ED Triage Notes (Signed)
Reports having vaginal  Bleeding for the last two weeks.  Reports bleeding worse the last two days.  Reports having large clots.

## 2016-12-22 ENCOUNTER — Encounter (HOSPITAL_COMMUNITY): Payer: Self-pay | Admitting: Radiology

## 2016-12-22 ENCOUNTER — Emergency Department (HOSPITAL_COMMUNITY)
Admission: EM | Admit: 2016-12-22 | Discharge: 2016-12-22 | Disposition: A | Discharge status: Home / Self Care | Payer: Self-pay | Attending: Emergency Medicine | Admitting: Emergency Medicine

## 2016-12-22 ENCOUNTER — Emergency Department (HOSPITAL_COMMUNITY): Payer: Self-pay

## 2016-12-22 DIAGNOSIS — N939 Abnormal uterine and vaginal bleeding, unspecified: Secondary | ICD-10-CM

## 2016-12-22 DIAGNOSIS — R000 Tachycardia, unspecified: Secondary | ICD-10-CM

## 2016-12-22 DIAGNOSIS — I10 Essential (primary) hypertension: Secondary | ICD-10-CM

## 2016-12-22 LAB — CBC
HCT: 22.6 % — ABNORMAL LOW (ref 36.0–46.0)
Hemoglobin: 7.4 g/dL — ABNORMAL LOW (ref 12.0–15.0)
MCH: 29.7 pg (ref 26.0–34.0)
MCHC: 32.7 g/dL (ref 30.0–36.0)
MCV: 90.8 fL (ref 78.0–100.0)
PLATELETS: 228 10*3/uL (ref 150–400)
RBC: 2.49 MIL/uL — AB (ref 3.87–5.11)
RDW: 15.5 % (ref 11.5–15.5)
WBC: 5.4 10*3/uL (ref 4.0–10.5)

## 2016-12-22 LAB — BASIC METABOLIC PANEL
Anion gap: 6 (ref 5–15)
BUN: 8 mg/dL (ref 6–20)
CALCIUM: 8.3 mg/dL — AB (ref 8.9–10.3)
CO2: 23 mmol/L (ref 22–32)
Chloride: 112 mmol/L — ABNORMAL HIGH (ref 101–111)
Creatinine, Ser: 0.79 mg/dL (ref 0.44–1.00)
GFR calc Af Amer: >60 mL/min (ref >60)
GLUCOSE: 107 mg/dL — AB (ref 65–99)
POTASSIUM: 3.6 mmol/L (ref 3.5–5.1)
Sodium: 141 mmol/L (ref 135–145)

## 2016-12-22 LAB — GC/CHLAMYDIA PROBE AMP (~~LOC~~) NOT AT ARMC
Chlamydia: NEGATIVE
Neisseria Gonorrhea: NEGATIVE

## 2016-12-22 LAB — WET PREP, GENITAL
Clue Cells Wet Prep HPF POC: NONE SEEN
Sperm: NONE SEEN
Trich, Wet Prep: NONE SEEN
YEAST WET PREP: NONE SEEN

## 2016-12-22 LAB — I-STAT BETA HCG BLOOD, ED (MC, WL, AP ONLY)

## 2016-12-22 MED ORDER — IBUPROFEN 800 MG PO TABS
800.0000 mg | ORAL_TABLET | Freq: Once | ORAL | Status: AC
  Administered 2016-12-22: 800 mg via ORAL
  Filled 2016-12-22: qty 1

## 2016-12-22 MED ORDER — HYDROCHLOROTHIAZIDE 25 MG PO TABS: 25.0000 mg | ORAL_TABLET | Freq: Every day | ORAL | 3 refills | Status: DC

## 2016-12-22 MED ORDER — MEDROXYPROGESTERONE ACETATE 10 MG PO TABS
10.0000 mg | ORAL_TABLET | Freq: Once | ORAL | Status: AC
  Administered 2016-12-22: 10 mg via ORAL
  Filled 2016-12-22: qty 1

## 2016-12-22 MED ORDER — MEGESTROL ACETATE 40 MG PO TABS: ORAL_TABLET | ORAL | 0 refills | Status: DC

## 2016-12-22 MED ORDER — IOPAMIDOL (ISOVUE-370) INJECTION 76%
INTRAVENOUS | Status: AC
  Administered 2016-12-22: 80 mL
  Filled 2016-12-22: qty 100

## 2016-12-22 MED ORDER — FERROUS SULFATE 325 (65 FE) MG PO TABS: 325.0000 mg | ORAL_TABLET | Freq: Three times a day (TID) | ORAL | 3 refills | Status: DC

## 2016-12-22 MED ORDER — SODIUM CHLORIDE 0.9 % IV BOLUS (SEPSIS)
1000.0000 mL | Freq: Once | INTRAVENOUS | Status: AC
  Administered 2016-12-22: 1000 mL via INTRAVENOUS

## 2016-12-22 NOTE — Discharge Instructions (Signed)
1. Medications: Megace, usual home medications 2. Treatment: rest, drink plenty of fluids,  3. Follow Up: Please followup with [facility] in 3-5 days for discussion of your diagnoses and further evaluation after today's visit; if you do not have a primary care doctor use the resource guide provided to find one; Please return to the ER for worsening bleeding, symptoms of DVT, Chest pain, shortness of breath or other concerns
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
## 2017-01-09 ENCOUNTER — Ambulatory Visit: Payer: Self-pay | Admitting: Obstetrics & Gynecology

## 2017-01-09 ENCOUNTER — Encounter (HOSPITAL_COMMUNITY): Payer: Self-pay

## 2017-01-09 ENCOUNTER — Encounter: Payer: Self-pay | Admitting: Obstetrics & Gynecology

## 2017-01-09 VITALS — BP 147/75 | HR 83 | Ht 61.0 in | Wt 191.0 lb

## 2017-01-09 DIAGNOSIS — N939 Abnormal uterine and vaginal bleeding, unspecified: Secondary | ICD-10-CM

## 2017-01-09 DIAGNOSIS — D500 Iron deficiency anemia secondary to blood loss (chronic): Secondary | ICD-10-CM

## 2017-01-09 DIAGNOSIS — D219 Benign neoplasm of connective and other soft tissue, unspecified: Secondary | ICD-10-CM | POA: Insufficient documentation
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
## 2017-01-14 ENCOUNTER — Inpatient Hospital Stay: Payer: Self-pay

## 2017-01-14 NOTE — Progress Notes (Signed)
Patient ID: Nina Hopkins, female   DOB: 30-Nov-1960, 56 y.o.   MRN: [id number]      Nina Hopkins, is a 56 y.o. female  XVQ:[id number]  KDT:[id number]  DOB - March 12, 1960  Subjective:  Chief Complaint and HPI: Nina Hopkins is a 56 y.o. female here today to establish care and for a follow up visit After being seen in the ED 12/22/2016 for DUB and anemia.  She also had L leg swelling and h/o DVT.  CT scan did show PE.  She has since been to the OB/GYN and was placed on megace and iron and is scheduled for hysterectomy due to fibroids and DUB.  The vaginal bleeding has gotten much better.  She is feeling better overall.  Still occasionally feels SOB and rapid heart rate but has had this for more than 2 years. Also L ankle swelling for years.  No calf swelling/ LE pain/erythema. She c/o occasional paresthesias in her fingers.  denies CP.  Wants to apply for financial aid.   ED/Hospital notes reviewed.    ROS:   Constitutional:  No f/c, No night sweats, No unexplained weight loss. EENT:  No vision changes, No blurry vision, No hearing changes. No mouth, throat, or ear problems.  Respiratory: No cough, Occasional SOB; none now. Cardiac: No CP, no palpitations; occasional sensation of rapid heart rate GI:  No abd pain, No N/V/D. GU: No Urinary s/sx Musculoskeletal: No joint pain Neuro: No headache, no dizziness, no motor weakness.  Skin: No rash Endocrine:  No polydipsia. No polyuria.  Psych: Denies SI/HI  No problems updated.  ALLERGIES: Allergies  Allergen Reactions  . Prilosec [Omeprazole]     Stiff neck    PAST MEDICAL HISTORY: Past Medical History:  Diagnosis Date  . Anemia   . Anxiety ~ 2008   "nothing since ~ 2008" (12/20/2014)  . Family history of adverse reaction to anesthesia    "my sister had a hard time waking up"  . History of blood transfusion 12/20/2014   related to anemia  . Hypertension     MEDICATIONS AT HOME: Prior to Admission medications     Medication Sig Start Date End Date Taking? Authorizing Provider  ferrous sulfate 325 (65 FE) MG tablet Take 1 tablet (325 mg total) 3 (three) times daily with meals by mouth. 12/22/16  Yes Muthersbaugh, Jarrett Soho, PA-C  hydrochlorothiazide (HYDRODIURIL) 25 MG tablet Take 1 tablet (25 mg total) daily by mouth. 12/22/16  Yes Muthersbaugh, Jarrett Soho, PA-C  megestrol (MEGACE) 40 MG tablet Take 40mg  PO TID until the bleeding stops.  Then take 40mg  QD until evaluated by GYN. 12/22/16  Yes Muthersbaugh, Jarrett Soho, PA-C  ibuprofen (ADVIL,MOTRIN) 800 MG tablet Take 1 tablet (800 mg total) by mouth every 8 (eight) hours as needed for mild pain. Patient not taking: Reported on 01/09/2017 10/02/16   Ward, Delice Bison, DO     Objective:  EXAM:   Vitals:   01/15/17 0850  BP: 122/80  Pulse: 88  Resp: 16  Temp: 99.1 F (37.3 C)  TempSrc: Oral  SpO2: 98%  Weight: 193 lb 3.2 oz (87.6 kg)  Height: 5\' 1"  (1.549 m)    General appearance : A&OX3. NAD. Non-toxic-appearing HEENT: Atraumatic and Normocephalic.  PERRLA. EOM intact.   Neck: supple, no JVD. No cervical lymphadenopathy. No thyromegaly Chest/Lungs:  Breathing-non-labored, Good air entry bilaterally, breath sounds normal without rales, rhonchi, or wheezing  CVS: S1 S2 regular, no murmurs, gallops, rubs  Extremities: Bilateral Lower Ext shows no edema, both legs  are warm to touch with = pulse throughout.  Calves w/o erythema or swelling.   Neurology:  CN II-XII grossly intact, Non focal.   Psych:  TP linear. J/I WNL. Normal speech. Appropriate eye contact and affect.  Skin:  No Rash  Data Review No results found for: HGBA1C   Assessment & Plan   1. Iron deficiency anemia due to chronic blood loss Continue megace and Iron per gyn instructions - CBC with Differential/Platelet-stat.  Last Hgb 7.4  2. Paresthesia - Vitamin D, 25-hydroxy - TSH  3. Menorrhagia with regular cycle Continue f/up with gyn   Patient have been counseled extensively  about nutrition and exercise  Return in about 1 month (around 02/15/2017) for assign PCP; recheck anemia.  The patient was given clear instructions to go to ER or return to medical center if symptoms don't improve, worsen or new problems develop. The patient verbalized understanding. The patient was told to call to get lab results if they haven't heard anything in the next week.     Freeman Caldron, PA-C [facility] [place], [place]   01/15/2017, 9:31 AM

## 2017-01-15 ENCOUNTER — Ambulatory Visit: Payer: Self-pay | Attending: Physician Assistant | Admitting: Physician Assistant

## 2017-01-15 VITALS — BP 122/80 | HR 88 | Temp 99.1°F | Resp 16 | Ht 61.0 in | Wt 193.2 lb

## 2017-01-15 DIAGNOSIS — M7989 Other specified soft tissue disorders: Secondary | ICD-10-CM | POA: Insufficient documentation

## 2017-01-15 DIAGNOSIS — F419 Anxiety disorder, unspecified: Secondary | ICD-10-CM | POA: Insufficient documentation

## 2017-01-15 DIAGNOSIS — I10 Essential (primary) hypertension: Secondary | ICD-10-CM | POA: Insufficient documentation

## 2017-01-15 DIAGNOSIS — R202 Paresthesia of skin: Secondary | ICD-10-CM | POA: Insufficient documentation

## 2017-01-15 DIAGNOSIS — R0602 Shortness of breath: Secondary | ICD-10-CM | POA: Insufficient documentation

## 2017-01-15 DIAGNOSIS — Z79899 Other long term (current) drug therapy: Secondary | ICD-10-CM | POA: Insufficient documentation

## 2017-01-15 DIAGNOSIS — N920 Excessive and frequent menstruation with regular cycle: Secondary | ICD-10-CM | POA: Insufficient documentation

## 2017-01-15 DIAGNOSIS — Z86718 Personal history of other venous thrombosis and embolism: Secondary | ICD-10-CM | POA: Insufficient documentation

## 2017-01-15 DIAGNOSIS — D500 Iron deficiency anemia secondary to blood loss (chronic): Secondary | ICD-10-CM | POA: Insufficient documentation

## 2017-01-15 LAB — CBC WITH DIFFERENTIAL/PLATELET
BASOS: 0 %
Basophils Absolute: 0 10*3/uL (ref 0.0–0.2)
EOS (ABSOLUTE): 0.1 10*3/uL (ref 0.0–0.4)
Eos: 3 %
HEMATOCRIT: 31.4 % — AB (ref 34.0–46.6)
Hemoglobin: 10.2 g/dL — ABNORMAL LOW (ref 11.1–15.9)
LYMPHS ABS: 0.9 10*3/uL (ref 0.7–3.1)
LYMPHS: 22 %
MCH: 29.2 pg (ref 26.6–33.0)
MCHC: 32.5 g/dL (ref 31.5–35.7)
MCV: 90 fL (ref 79–97)
MONOCYTES: 8 %
Monocytes Absolute: 0.3 10*3/uL (ref 0.1–0.9)
Neutrophils Absolute: 2.6 10*3/uL (ref 1.4–7.0)
Neutrophils: 67 %
PLATELETS: 347 10*3/uL (ref 150–379)
RBC: 3.49 x10E6/uL — ABNORMAL LOW (ref 3.77–5.28)
RDW: 15.4 % (ref 12.3–15.4)
WBC: 3.9 10*3/uL (ref 3.4–10.8)

## 2017-01-15 NOTE — Progress Notes (Signed)
Patient stated her vaginal bleeding is better and its still bleeding. Patient stated that her chest feel discomfort and straining. Patient stated she's getting shortness of breath.

## 2017-01-16 ENCOUNTER — Other Ambulatory Visit: Payer: Self-pay | Admitting: Physician Assistant

## 2017-01-16 DIAGNOSIS — E559 Vitamin D deficiency, unspecified: Secondary | ICD-10-CM

## 2017-01-16 LAB — TSH: TSH: 1.65 u[IU]/mL (ref 0.450–4.500)

## 2017-01-16 LAB — VITAMIN D 25 HYDROXY (VIT D DEFICIENCY, FRACTURES): Vit D, 25-Hydroxy: 19.9 ng/mL — ABNORMAL LOW (ref 30.0–100.0)

## 2017-01-16 MED ORDER — VITAMIN D (ERGOCALCIFEROL) 1.25 MG (50000 UNIT) PO CAPS: 50000.0000 [IU] | ORAL_CAPSULE | ORAL | 0 refills | Status: DC

## 2017-01-21 ENCOUNTER — Telehealth: Payer: Self-pay | Admitting: General Practice

## 2017-01-21 DIAGNOSIS — N938 Other specified abnormal uterine and vaginal bleeding: Secondary | ICD-10-CM

## 2017-01-21 MED ORDER — MEGESTROL ACETATE 40 MG PO TABS: 40.0000 mg | ORAL_TABLET | Freq: Two times a day (BID) | ORAL | 2 refills | Status: DC

## 2017-01-21 NOTE — Telephone Encounter (Signed)
Patient called and left message on nurse line stating she needs a refill of her Megace sent to [facility]. Per Dr Hulan Fray, may give enough refills to last until surgery. Called patient & informed her of Rx sent to pharmacy. Patient verbalized understanding & had no questions

## 2017-01-30 ENCOUNTER — Ambulatory Visit: Payer: Self-pay | Admitting: Obstetrics & Gynecology

## 2017-01-30 ENCOUNTER — Other Ambulatory Visit (HOSPITAL_COMMUNITY)
Admission: RE | Admit: 2017-01-30 | Discharge: 2017-01-30 | Disposition: A | Discharge status: Home / Self Care | Payer: Self-pay | Source: Ambulatory Visit | Attending: Obstetrics & Gynecology | Admitting: Obstetrics & Gynecology

## 2017-01-30 ENCOUNTER — Encounter: Payer: Self-pay | Admitting: Obstetrics & Gynecology

## 2017-01-30 VITALS — BP 142/88 | HR 72 | Wt 190.7 lb

## 2017-01-30 DIAGNOSIS — N938 Other specified abnormal uterine and vaginal bleeding: Secondary | ICD-10-CM

## 2017-01-30 DIAGNOSIS — Z3202 Encounter for pregnancy test, result negative: Secondary | ICD-10-CM

## 2017-01-30 DIAGNOSIS — D259 Leiomyoma of uterus, unspecified: Secondary | ICD-10-CM | POA: Insufficient documentation

## 2017-01-30 DIAGNOSIS — D219 Benign neoplasm of connective and other soft tissue, unspecified: Secondary | ICD-10-CM

## 2017-01-30 LAB — POCT PREGNANCY, URINE: PREG TEST UR: NEGATIVE

## 2017-01-30 NOTE — Addendum Note (Signed)
Addended by: Riccardo Dubin on: 01/30/2017 03:50 PM   Modules accepted: Orders

## 2017-01-30 NOTE — Progress Notes (Signed)
   Subjective:    Patient ID: Nina Hopkins, female    DOB: January 28, 1961, 56 y.o.   MRN: [id number]  HPI 56 yo lady here with DUB, scheduled for a TAH/BSO next month, here for [facility].   Review of Systems     Objective:   Physical Exam Breathing, conversing, and ambulating normally Well nourished, well hydrated Black female, no apparent distress Uterus up to her umbilicus  UPT negative, consent signed, time out done Cervix prepped with betadine and grasped with a single tooth tenaculum Uterus sounded to 15 cm Pipelle used for 2 passes with a moderate amount of tissue and dark blood obtained. She tolerated the procedure well.     Assessment & Plan:  DUB, fibroids- plan for TAH/BSO  Await Embx results

## 2017-02-10 ENCOUNTER — Encounter: Payer: Self-pay | Admitting: *Deleted

## 2017-02-11 ENCOUNTER — Ambulatory Visit: Payer: Self-pay | Attending: Family Medicine

## 2017-02-16 ENCOUNTER — Encounter: Payer: Self-pay | Admitting: Family Medicine

## 2017-02-16 ENCOUNTER — Ambulatory Visit: Payer: Self-pay | Attending: Family Medicine | Admitting: Family Medicine

## 2017-02-16 ENCOUNTER — Other Ambulatory Visit: Payer: Self-pay

## 2017-02-16 VITALS — BP 121/78 | HR 82 | Temp 98.7°F | Resp 18 | Ht 61.0 in | Wt 191.0 lb

## 2017-02-16 DIAGNOSIS — D649 Anemia, unspecified: Secondary | ICD-10-CM | POA: Insufficient documentation

## 2017-02-16 DIAGNOSIS — I10 Essential (primary) hypertension: Secondary | ICD-10-CM | POA: Insufficient documentation

## 2017-02-16 DIAGNOSIS — Z0000 Encounter for general adult medical examination without abnormal findings: Secondary | ICD-10-CM

## 2017-02-16 DIAGNOSIS — R9431 Abnormal electrocardiogram [ECG] [EKG]: Secondary | ICD-10-CM

## 2017-02-16 DIAGNOSIS — R079 Chest pain, unspecified: Secondary | ICD-10-CM | POA: Insufficient documentation

## 2017-02-16 DIAGNOSIS — Z862 Personal history of diseases of the blood and blood-forming organs and certain disorders involving the immune mechanism: Secondary | ICD-10-CM

## 2017-02-16 DIAGNOSIS — Z79899 Other long term (current) drug therapy: Secondary | ICD-10-CM | POA: Insufficient documentation

## 2017-02-16 MED ORDER — IBUPROFEN 600 MG PO TABS: 600.0000 mg | ORAL_TABLET | Freq: Three times a day (TID) | ORAL | 0 refills | Status: DC | PRN

## 2017-02-16 NOTE — Progress Notes (Signed)
Subjective:  Patient ID: Nina Hopkins, female    DOB: 01-01-1961  Age: 57 y.o. MRN: [id number]  CC: Anemia   HPI Nina Hopkins presents for anemia follow up. History of IDA related to menorrhagia and DUB. History of ED visit in Nov 2018 for vaginal bleeding where  Hgba1c was found to be 7.4. Given course of iron supplements and megace and was discharged. History of pelvic US in 11/2015 that showed multiple uterine fibroids measuring up[ to 7.4 cm. She has been evaluated by gynecology. Plan for hysterectomy and continue megace.    -Chest pain, unspecified type Onset 1.5 years ago. Symptom re-occurring last  Described as intermittent and aching. Worsened with movement or sneezing. She denies any SOB, CP, hemoptysis, or dyspepsia.   -Hypertension, unspecified type History of HTN. Currently on [place]. She reports checking BP once a week. She reports occasional readings of 140's SBP or DBP 90's-105's. She reports usually having headaches when she obtains these readings. She is a non-smoker.    Outpatient Medications Prior to Visit  Medication Sig Dispense Refill  . ferrous sulfate 325 (65 FE) MG tablet Take 1 tablet (325 mg total) 3 (three) times daily with meals by mouth. 90 tablet 3  . hydrochlorothiazide (HYDRODIURIL) 25 MG tablet Take 1 tablet (25 mg total) daily by mouth. 90 tablet 3  . megestrol (MEGACE) 40 MG tablet Take 1 tablet (40 mg total) by mouth 2 (two) times daily. 60 tablet 2  . Vitamin D, Ergocalciferol, (DRISDOL) 50000 units CAPS capsule Take 1 capsule (50,000 Units total) by mouth every 7 (seven) days. 16 capsule 0  . ibuprofen (ADVIL,MOTRIN) 800 MG tablet Take 1 tablet (800 mg total) by mouth every 8 (eight) hours as needed for mild pain. 30 tablet 0   No facility-administered medications prior to visit.     ROS Review of Systems  Constitutional: Negative.   Respiratory: Negative.   Cardiovascular: Positive for chest pain.  Gastrointestinal: Negative.   Skin:  Negative.    Objective:  BP 121/78 (BP Location: Left Arm, Patient Position: Sitting, Cuff Size: Large)   Pulse 82   Temp 98.7 F (37.1 C) (Oral)   Resp 18   Ht 5\' 1"  (1.549 m)   Wt 191 lb (86.6 kg)   LMP 01/08/2017 (LMP Unknown) Comment: hysterectomy schedueld for next month  SpO2 98%   BMI 36.09 kg/m   BP/Weight 02/16/2017 01/30/2017 52/77/8242  Systolic BP 353 614 431  Diastolic BP 78 88 80  Wt. (Lbs) 191 190.7 193.2  BMI 36.09 36.03 36.5    Physical Exam  Constitutional: She is oriented to person, place, and time. She appears well-developed and well-nourished.  HENT:  Head: Normocephalic and atraumatic.  Nose: Nose normal.  Mouth/Throat: Oropharynx is clear and moist.  Eyes: Conjunctivae are normal.  Neck: No JVD present.  Cardiovascular: Normal rate, regular rhythm, normal heart sounds and intact distal pulses.  Pulmonary/Chest: Effort normal and breath sounds normal.  Abdominal: Soft. Bowel sounds are normal. There is no tenderness.  Neurological: She is alert and oriented to person, place, and time.  Skin: Skin is warm and dry.  Psychiatric: She has a normal mood and affect.  Nursing note and vitals reviewed.    Assessment & Plan:   1. History of anemia  - CBC - Basic Metabolic Panel  2. Chest pain, unspecified type -R/o cardiac vs chest wall pain vs anemia. - Basic Metabolic Panel - CBC - EKG 12-Lead - ibuprofen (ADVIL,MOTRIN) 600 MG tablet; Take  1 tablet (600 mg total) by mouth every 8 (eight) hours as needed for mild pain.  Dispense: 30 tablet; Refill: 0  3. Hypertension, unspecified type -Start check BP 2 to 3 x's per week. Bring blood pressure log to next office visit. -DASH diet discussed. Currently on [place] obtain labs. - Basic Metabolic Panel  4. Healthcare maintenance  - Hepatitis C Antibody - HIV antibody (with reflex)  5. Abnormal ECG  - ECHOCARDIOGRAM COMPLETE; Future      Follow-up: Return in about 2 weeks (around 03/02/2017)  for BP.   Alfonse Spruce FNP

## 2017-02-16 NOTE — Patient Instructions (Addendum)
Check BP 3 x's per week. Bring BP log to next office visit.  How to Take Your Blood Pressure You can take your blood pressure at home with a machine. You may need to check your blood pressure at home:  To check if you have high blood pressure (hypertension).  To check your blood pressure over time.  To make sure your blood pressure medicine is working.  Supplies needed: You will need a blood pressure machine, or monitor. You can buy one at a drugstore or online. When choosing one:  Choose one with an arm cuff.  Choose one that wraps around your upper arm. Only one finger should fit between your arm and the cuff.  Do not choose one that measures your blood pressure from your wrist or finger.  Your doctor can suggest a monitor. How to prepare Avoid these things for 30 minutes before checking your blood pressure:  Drinking caffeine.  Drinking alcohol.  Eating.  Smoking.  Exercising.  Five minutes before checking your blood pressure:  Pee.  Sit in a dining chair. Avoid sitting in a soft couch or armchair.  Be quiet. Do not talk.  How to take your blood pressure Follow the instructions that came with your machine. If you have a digital blood pressure monitor, these may be the instructions: 1. Sit up straight. 2. Place your feet on the floor. Do not cross your ankles or legs. 3. Rest your left arm at the level of your heart. You may rest it on a table, desk, or chair. 4. Pull up your shirt sleeve. 5. Wrap the blood pressure cuff around the upper part of your left arm. The cuff should be 1 inch (2.5 cm) above your elbow. It is best to wrap the cuff around bare skin. 6. Fit the cuff snugly around your arm. You should be able to place only one finger between the cuff and your arm. 7. Put the cord inside the groove of your elbow. 8. Press the power button. 9. Sit quietly while the cuff fills with air and loses air. 10. Write down the numbers on the screen. 11. Wait 2-3  minutes and then repeat steps 1-10.  What do the numbers mean? Two numbers make up your blood pressure. The first number is called systolic pressure. The second is called diastolic pressure. An example of a blood pressure reading is "120 over 80" (or 120/80). If you are an adult and do not have a medical condition, use this guide to find out if your blood pressure is normal: Normal  First number: below 120.  Second number: below 80. Elevated  First number: 120-129.  Second number: below 80. Hypertension stage 1  First number: 130-139.  Second number: 80-89. Hypertension stage 2  First number: 140 or above.  Second number: 46 or above. Your blood pressure is above normal even if only the top or bottom number is above normal. Follow these instructions at home:  Check your blood pressure as often as your doctor tells you to.  Take your monitor to your next doctor's appointment. Your doctor will: ? Make sure you are using it correctly. ? Make sure it is working right.  Make sure you understand what your blood pressure numbers should be.  Tell your doctor if your medicines are causing side effects. Contact a doctor if:  Your blood pressure keeps being high. Get help right away if:  Your first blood pressure number is higher than 180.  Your second blood pressure  number is higher than 120. This information is not intended to replace advice given to you by your health care provider. Make sure you discuss any questions you have with your health care provider. Document Released: 01/03/2008 Document Revised: 12/19/2015 Document Reviewed: 06/29/2015 Elsevier Interactive Patient Education  [employer].

## 2017-02-17 LAB — BASIC METABOLIC PANEL
BUN/Creatinine Ratio: 10 (ref 9–23)
BUN: 9 mg/dL (ref 6–24)
CHLORIDE: 106 mmol/L (ref 96–106)
CO2: 26 mmol/L (ref 20–29)
Calcium: 9.7 mg/dL (ref 8.7–10.2)
Creatinine, Ser: 0.86 mg/dL (ref 0.57–1.00)
GFR calc Af Amer: 89 mL/min/{1.73_m2} (ref >59)
GFR calc non Af Amer: 77 mL/min/{1.73_m2} (ref >59)
GLUCOSE: 101 mg/dL — AB (ref 65–99)
Potassium: 3.9 mmol/L (ref 3.5–5.2)
Sodium: 145 mmol/L — ABNORMAL HIGH (ref 134–144)

## 2017-02-17 LAB — CBC
HEMATOCRIT: 39.7 % (ref 34.0–46.6)
Hemoglobin: 12.9 g/dL (ref 11.1–15.9)
MCH: 30.6 pg (ref 26.6–33.0)
MCHC: 32.5 g/dL (ref 31.5–35.7)
MCV: 94 fL (ref 79–97)
Platelets: 286 10*3/uL (ref 150–379)
RBC: 4.21 x10E6/uL (ref 3.77–5.28)
RDW: 13.5 % (ref 12.3–15.4)
WBC: 4.3 10*3/uL (ref 3.4–10.8)

## 2017-02-17 LAB — HEPATITIS C ANTIBODY: Hep C Virus Ab: 0.1 s/co ratio (ref 0.0–0.9)

## 2017-02-17 LAB — HIV ANTIBODY (ROUTINE TESTING W REFLEX): HIV SCREEN 4TH GENERATION: NONREACTIVE

## 2017-02-19 ENCOUNTER — Telehealth: Payer: Self-pay | Admitting: *Deleted

## 2017-02-19 ENCOUNTER — Other Ambulatory Visit (HOSPITAL_COMMUNITY): Payer: Self-pay

## 2017-02-19 NOTE — Telephone Encounter (Signed)
Nina Hopkins left a message 02/17/17 pm stating she needs a doctors note for work about her surgery, please call her,

## 2017-02-20 NOTE — Telephone Encounter (Signed)
Patient called again. Needs to get a note for LOA for upcoming surgery.

## 2017-02-23 ENCOUNTER — Ambulatory Visit (HOSPITAL_COMMUNITY)
Admission: RE | Admit: 2017-02-23 | Discharge: 2017-02-23 | Disposition: A | Discharge status: Home / Self Care | Payer: Self-pay | Source: Ambulatory Visit | Attending: Family Medicine | Admitting: Family Medicine

## 2017-02-23 DIAGNOSIS — I071 Rheumatic tricuspid insufficiency: Secondary | ICD-10-CM | POA: Insufficient documentation

## 2017-02-23 DIAGNOSIS — R9431 Abnormal electrocardiogram [ECG] [EKG]: Secondary | ICD-10-CM

## 2017-02-23 DIAGNOSIS — I420 Dilated cardiomyopathy: Secondary | ICD-10-CM | POA: Insufficient documentation

## 2017-02-23 NOTE — Progress Notes (Signed)
  Echocardiogram 2D Echocardiogram has been performed.  Nina Hopkins F 02/23/2017, 9:08 AM

## 2017-02-25 ENCOUNTER — Encounter: Payer: Self-pay | Admitting: *Deleted

## 2017-02-25 NOTE — Telephone Encounter (Signed)
I called Nina Hopkins and she needs a letter stating when she will have surgery and how long she will be out. I informed her I can prepare the letter and she can come by to front desk to pick it up. She voices understanding.

## 2017-02-26 ENCOUNTER — Other Ambulatory Visit: Payer: Self-pay | Admitting: Family Medicine

## 2017-02-26 DIAGNOSIS — R931 Abnormal findings on diagnostic imaging of heart and coronary circulation: Secondary | ICD-10-CM

## 2017-02-27 ENCOUNTER — Telehealth: Payer: Self-pay | Admitting: *Deleted

## 2017-02-27 NOTE — Telephone Encounter (Signed)
-----   Message from Alfonse Spruce, [place] sent at 02/23/2017  1:05 PM EST ----- Labs normal. No anemia present. HIV, Hepatitis C are negative.

## 2017-02-27 NOTE — Telephone Encounter (Signed)
-----   Message from Alfonse Spruce, [place] sent at 02/27/2017  8:44 AM EST ----- -Echocardiogram which looks at your heart structure and function showed mild enlargement of left sided heart chamber. This can occur with uncontrolled hypertension overtime.  Small amount of fluid around heart sac. Mild to moderate regurgitation of the heart valve.  You will be referred to cardiology for further f/u.

## 2017-02-27 NOTE — Telephone Encounter (Signed)
Patient verified DOB Patient is aware of all other labs being normal. No further questions.

## 2017-02-27 NOTE — Telephone Encounter (Signed)
Patient verified DOB Patient is aware of ECHO showing some mild enlargement in the right chamber, regurgitation and small fluid around the heart sac. Patient is aware of referral being placed and receiving a call within 3 weeks for intitial appointment. No further questions at this time.

## 2017-03-02 ENCOUNTER — Ambulatory Visit: Payer: Self-pay | Attending: Family Medicine | Admitting: Family Medicine

## 2017-03-02 ENCOUNTER — Other Ambulatory Visit: Payer: Self-pay

## 2017-03-02 VITALS — BP 141/97 | HR 79 | Temp 99.0°F | Resp 12 | Wt 191.0 lb

## 2017-03-02 DIAGNOSIS — Z13220 Encounter for screening for lipoid disorders: Secondary | ICD-10-CM

## 2017-03-02 DIAGNOSIS — Z1211 Encounter for screening for malignant neoplasm of colon: Secondary | ICD-10-CM

## 2017-03-02 DIAGNOSIS — I10 Essential (primary) hypertension: Secondary | ICD-10-CM

## 2017-03-02 DIAGNOSIS — Z79899 Other long term (current) drug therapy: Secondary | ICD-10-CM | POA: Insufficient documentation

## 2017-03-02 MED ORDER — LOSARTAN POTASSIUM 50 MG PO TABS: 50.0000 mg | ORAL_TABLET | Freq: Every day | ORAL | 2 refills | Status: DC

## 2017-03-02 NOTE — Patient Instructions (Signed)
DASH Eating Plan DASH stands for "Dietary Approaches to Stop Hypertension." The DASH eating plan is a healthy eating plan that has been shown to reduce high blood pressure (hypertension). It may also reduce your risk for type 2 diabetes, heart disease, and stroke. The DASH eating plan may also help with weight loss. What are tips for following this plan? General guidelines  Avoid eating more than 2,300 mg (milligrams) of salt (sodium) a day. If you have hypertension, you may need to reduce your sodium intake to 1,500 mg a day.  Limit alcohol intake to no more than 1 drink a day for nonpregnant women and 2 drinks a day for men. One drink equals 12 oz of beer, 5 oz of wine, or 1 oz of hard liquor.  Work with your health care provider to maintain a healthy body weight or to lose weight. Ask what an ideal weight is for you.  Get at least 30 minutes of exercise that causes your heart to beat faster (aerobic exercise) most days of the week. Activities may include walking, swimming, or biking.  Work with your health care provider or diet and nutrition specialist (dietitian) to adjust your eating plan to your individual calorie needs. Reading food labels  Check food labels for the amount of sodium per serving. Choose foods with less than 5 percent of the Daily Value of sodium. Generally, foods with less than 300 mg of sodium per serving fit into this eating plan.  To find whole grains, look for the word "whole" as the first word in the ingredient list. Shopping  Buy products labeled as "low-sodium" or "no salt added."  Buy fresh foods. Avoid canned foods and premade or frozen meals. Cooking  Avoid adding salt when cooking. Use salt-free seasonings or herbs instead of table salt or sea salt. Check with your health care provider or pharmacist before using salt substitutes.  Do not fry foods. Cook foods using healthy methods such as baking, boiling, grilling, and broiling instead.  Cook with  heart-healthy oils, such as olive, canola, soybean, or sunflower oil. Meal planning   Eat a balanced diet that includes: ? 5 or more servings of fruits and vegetables each day. At each meal, try to fill half of your plate with fruits and vegetables. ? Up to 6-8 servings of whole grains each day. ? Less than 6 oz of lean meat, poultry, or fish each day. A 3-oz serving of meat is about the same size as a deck of cards. One egg equals 1 oz. ? 2 servings of low-fat dairy each day. ? A serving of nuts, seeds, or beans 5 times each week. ? Heart-healthy fats. Healthy fats called Omega-3 fatty acids are found in foods such as flaxseeds and coldwater fish, like sardines, salmon, and mackerel.  Limit how much you eat of the following: ? Canned or prepackaged foods. ? Food that is high in trans fat, such as fried foods. ? Food that is high in saturated fat, such as fatty meat. ? Sweets, desserts, sugary drinks, and other foods with added sugar. ? Full-fat dairy products.  Do not salt foods before eating.  Try to eat at least 2 vegetarian meals each week.  Eat more home-cooked food and less restaurant, buffet, and fast food.  When eating at a restaurant, ask that your food be prepared with less salt or no salt, if possible. What foods are recommended? The items listed may not be a complete list. Talk with your dietitian about what   dietary choices are best for you. Grains Whole-grain or whole-wheat bread. Whole-grain or whole-wheat pasta. Brown rice. Oatmeal. Quinoa. Bulgur. Whole-grain and low-sodium cereals. Pita bread. Low-fat, low-sodium crackers. Whole-wheat flour tortillas. Vegetables Fresh or frozen vegetables (raw, steamed, roasted, or grilled). Low-sodium or reduced-sodium tomato and vegetable juice. Low-sodium or reduced-sodium tomato sauce and tomato paste. Low-sodium or reduced-sodium canned vegetables. Fruits All fresh, dried, or frozen fruit. Canned fruit in natural juice (without  added sugar). Meat and other protein foods Skinless chicken or turkey. Ground chicken or turkey. Pork with fat trimmed off. Fish and seafood. Egg whites. Dried beans, peas, or lentils. Unsalted nuts, nut butters, and seeds. Unsalted canned beans. Lean cuts of beef with fat trimmed off. Low-sodium, lean deli meat. Dairy Low-fat (1%) or fat-free (skim) milk. Fat-free, low-fat, or reduced-fat cheeses. Nonfat, low-sodium ricotta or cottage cheese. Low-fat or nonfat yogurt. Low-fat, low-sodium cheese. Fats and oils Soft margarine without trans fats. Vegetable oil. Low-fat, reduced-fat, or light mayonnaise and salad dressings (reduced-sodium). Canola, safflower, olive, soybean, and sunflower oils. Avocado. Seasoning and other foods Herbs. Spices. Seasoning mixes without salt. Unsalted popcorn and pretzels. Fat-free sweets. What foods are not recommended? The items listed may not be a complete list. Talk with your dietitian about what dietary choices are best for you. Grains Baked goods made with fat, such as croissants, muffins, or some breads. Dry pasta or rice meal packs. Vegetables Creamed or fried vegetables. Vegetables in a cheese sauce. Regular canned vegetables (not low-sodium or reduced-sodium). Regular canned tomato sauce and paste (not low-sodium or reduced-sodium). Regular tomato and vegetable juice (not low-sodium or reduced-sodium). Pickles. Olives. Fruits Canned fruit in a light or heavy syrup. Fried fruit. Fruit in cream or butter sauce. Meat and other protein foods Fatty cuts of meat. Ribs. Fried meat. Bacon. Sausage. Bologna and other processed lunch meats. Salami. Fatback. Hotdogs. Bratwurst. Salted nuts and seeds. Canned beans with added salt. Canned or smoked fish. Whole eggs or egg yolks. Chicken or turkey with skin. Dairy Whole or 2% milk, cream, and half-and-half. Whole or full-fat cream cheese. Whole-fat or sweetened yogurt. Full-fat cheese. Nondairy creamers. Whipped toppings.  Processed cheese and cheese spreads. Fats and oils Butter. Stick margarine. Lard. Shortening. Ghee. Bacon fat. Tropical oils, such as coconut, palm kernel, or palm oil. Seasoning and other foods Salted popcorn and pretzels. Onion salt, garlic salt, seasoned salt, table salt, and sea salt. Worcestershire sauce. Tartar sauce. Barbecue sauce. Teriyaki sauce. Soy sauce, including reduced-sodium. Steak sauce. Canned and packaged gravies. Fish sauce. Oyster sauce. Cocktail sauce. Horseradish that you find on the shelf. Ketchup. Mustard. Meat flavorings and tenderizers. Bouillon cubes. Hot sauce and Tabasco sauce. Premade or packaged marinades. Premade or packaged taco seasonings. Relishes. Regular salad dressings. Where to find more information:  National Heart, Lung, and Blood Institute: www.nhlbi.nih.gov  American Heart Association: www.heart.org Summary  The DASH eating plan is a healthy eating plan that has been shown to reduce high blood pressure (hypertension). It may also reduce your risk for type 2 diabetes, heart disease, and stroke.  With the DASH eating plan, you should limit salt (sodium) intake to 2,300 mg a day. If you have hypertension, you may need to reduce your sodium intake to 1,500 mg a day.  When on the DASH eating plan, aim to eat more fresh fruits and vegetables, whole grains, lean proteins, low-fat dairy, and heart-healthy fats.  Work with your health care provider or diet and nutrition specialist (dietitian) to adjust your eating plan to your individual   calorie needs. This information is not intended to replace advice given to you by your health care provider. Make sure you discuss any questions you have with your health care provider. Document Released: 01/09/2011 Document Revised: 01/14/2016 Document Reviewed: 01/14/2016 Elsevier Interactive Patient Education  2018 Elsevier Inc.  

## 2017-03-02 NOTE — Progress Notes (Signed)
Discuss blood pressure Results for pre op EKG

## 2017-03-02 NOTE — Progress Notes (Signed)
   Subjective:  Patient ID: Nina Hopkins, female    DOB: May 30, 1960  Age: 57 y.o. MRN: [id number]  CC: Results and Hypertension   HPI Nina Hopkins presents for hypertension follow up.   Hypertension  Disease Monitoring  Blood pressure range: She does not check BP at home. She is a non-smoker.  Chest pain: no   Dyspnea: no   Claudication: no   Medication compliance: yes  Medication Side Effects   Lightheadedness: no   Urinary frequency: no   Edema: no   Preventitive Healthcare:  Exercise: no   Diet Pattern: regular    Salt Restriction: no   Outpatient Medications Prior to Visit  Medication Sig Dispense Refill  . ferrous sulfate 325 (65 FE) MG tablet Take 1 tablet (325 mg total) 3 (three) times daily with meals by mouth. 90 tablet 3  . hydrochlorothiazide (HYDRODIURIL) 25 MG tablet Take 1 tablet (25 mg total) daily by mouth. 90 tablet 3  . ibuprofen (ADVIL,MOTRIN) 600 MG tablet Take 1 tablet (600 mg total) by mouth every 8 (eight) hours as needed for mild pain. 30 tablet 0  . megestrol (MEGACE) 40 MG tablet Take 1 tablet (40 mg total) by mouth 2 (two) times daily. 60 tablet 2  . Vitamin D, Ergocalciferol, (DRISDOL) 50000 units CAPS capsule Take 1 capsule (50,000 Units total) by mouth every 7 (seven) days. 16 capsule 0   No facility-administered medications prior to visit.     ROS Review of Systems  Constitutional: Negative.   Eyes: Negative for visual disturbance.  Respiratory: Negative.   Cardiovascular: Negative.   Psychiatric/Behavioral: Negative for suicidal ideas.   Objective:  BP (!) 141/97 (BP Location: Left Arm, Patient Position: Sitting, Cuff Size: Large)   Pulse 79   Temp 99 F (37.2 C) (Oral)   Resp 12   Wt 191 lb (86.6 kg)   LMP 03/02/2017 Comment: hysterectomy schedueld for next month  SpO2 98%   BMI 36.09 kg/m   BP/Weight 03/02/2017 02/16/2017 19/50/9326  Systolic BP 712 458 099  Diastolic BP 97 78 88  Wt. (Lbs) 191 191 190.7  BMI 36.09  36.09 36.03    Physical Exam  Constitutional: She appears well-developed and well-nourished.  Eyes: Conjunctivae are normal.  Neck: No JVD present.  Cardiovascular: Normal rate, regular rhythm, normal heart sounds and intact distal pulses.  Pulmonary/Chest: Effort normal and breath sounds normal.  Abdominal: Soft. Bowel sounds are normal. There is no tenderness.  Skin: Skin is warm and dry.  Psychiatric: She has a normal mood and affect.  Nursing note and vitals reviewed.    Assessment & Plan:   1. Hypertension, essential Losartan added for better BP control. Patient informed of imaging results in office. - losartan (COZAAR) 50 MG tablet; Take 1 tablet (50 mg total) by mouth daily.  Dispense: 30 tablet; Refill: 2  2. Screening for colon cancer  - Fecal occult blood, imunochemical([facility])  3. Screening cholesterol level  - Lipid Panel; Future - Lipid Panel    Follow-up: Return in about 2 weeks (around 03/16/2017) for BP check with [facility].   Alfonse Spruce FNP

## 2017-03-03 ENCOUNTER — Ambulatory Visit (INDEPENDENT_AMBULATORY_CARE_PROVIDER_SITE_OTHER): Payer: Self-pay | Admitting: Cardiology

## 2017-03-03 ENCOUNTER — Encounter: Payer: Self-pay | Admitting: Cardiology

## 2017-03-03 VITALS — BP 132/80 | HR 88 | Ht 61.0 in | Wt 189.4 lb

## 2017-03-03 DIAGNOSIS — Z01810 Encounter for preprocedural cardiovascular examination: Secondary | ICD-10-CM

## 2017-03-03 DIAGNOSIS — R0609 Other forms of dyspnea: Secondary | ICD-10-CM

## 2017-03-03 DIAGNOSIS — R0789 Other chest pain: Secondary | ICD-10-CM

## 2017-03-03 LAB — LIPID PANEL
CHOL/HDL RATIO: 4 ratio (ref 0.0–4.4)
Cholesterol, Total: 181 mg/dL (ref 100–199)
HDL: 45 mg/dL (ref >39)
LDL CALC: 118 mg/dL — AB (ref 0–99)
Triglycerides: 90 mg/dL (ref 0–149)
VLDL CHOLESTEROL CAL: 18 mg/dL (ref 5–40)
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
## 2017-03-03 NOTE — Patient Instructions (Signed)
Medication Instructions:  1. Your physician recommends that you continue on your current medications as directed. Please refer to the Current Medication list given to you today.   Labwork: NONE ORDERED TODAY  Testing/Procedures: Your physician has requested that you have en exercise stress myoview. For further information please visit [email or web address]. Please follow instruction sheet, as given.    Follow-Up: AS NEEDED PENDING TEST RESULTS.   Any Other Special Instructions Will Be Listed Below (If Applicable).     If you need a refill on your cardiac medications before your next appointment, please call your pharmacy.

## 2017-03-04 ENCOUNTER — Telehealth (HOSPITAL_COMMUNITY): Payer: Self-pay | Admitting: *Deleted

## 2017-03-04 NOTE — Telephone Encounter (Signed)
Patient given detailed instructions per Myocardial Perfusion Study Information Sheet for the test on 03/06/17 at 7:15. Patient notified to arrive 15 minutes early and that it is imperative to arrive on time for appointment to keep from having the test rescheduled.  If you need to cancel or reschedule your appointment, please call the office within 24 hours of your appointment. . Patient verbalized understanding.Nina Hopkins

## 2017-03-05 ENCOUNTER — Other Ambulatory Visit: Payer: Self-pay | Admitting: Family Medicine

## 2017-03-05 DIAGNOSIS — E785 Hyperlipidemia, unspecified: Secondary | ICD-10-CM

## 2017-03-05 MED ORDER — PRAVASTATIN SODIUM 20 MG PO TABS
20.0000 mg | ORAL_TABLET | Freq: Every day | ORAL | 2 refills | Status: DC
Start: 2017-03-05 — End: 2017-10-15

## 2017-03-05 NOTE — Patient Instructions (Addendum)
Your procedure is scheduled on:  Tuesday, Feb 12  Enter through the Main Entrance of [facility] at:  1:30 pm  Pick up the phone at the desk and dial [phone].  Call this number if you have problems the morning of surgery: [phone].  Remember: You may have dry toast prior to 7 am Tuesday, day of surgery.  Do NOT drink clear liquids (including water) after 9 am Tuesday, day of surgery  Take these medicines the morning of surgery with a SIP OF WATER: prevastatin  Do NOT wear jewelry (body piercing), metal hair clips/bobby pins, make-up, or nail polish. Do NOT wear lotions, powders, or perfumes.  You may wear deoderant. Do NOT shave for 48 hours prior to surgery. Do NOT bring valuables to the hospital. Dentures, or bridgework may not be worn into surgery.  Leave suitcase in car.  After surgery it may be brought to your room.  For patients admitted to the hospital, checkout time is 11:00 AM the day of discharge. Have a responsible adult drive you home and stay with you for 24 hours after your procedure.  Home with Daughter Enis Gash cell [phone] or Daughter Destinee cell [phone].

## 2017-03-06 ENCOUNTER — Telehealth: Payer: Self-pay | Admitting: Family Medicine

## 2017-03-06 ENCOUNTER — Other Ambulatory Visit: Payer: Self-pay | Admitting: Family Medicine

## 2017-03-06 ENCOUNTER — Ambulatory Visit (HOSPITAL_COMMUNITY): Payer: Self-pay | Attending: Internal Medicine

## 2017-03-06 ENCOUNTER — Telehealth: Payer: Self-pay

## 2017-03-06 DIAGNOSIS — R0609 Other forms of dyspnea: Secondary | ICD-10-CM | POA: Insufficient documentation

## 2017-03-06 DIAGNOSIS — Z8719 Personal history of other diseases of the digestive system: Secondary | ICD-10-CM

## 2017-03-06 DIAGNOSIS — Z01810 Encounter for preprocedural cardiovascular examination: Secondary | ICD-10-CM | POA: Insufficient documentation

## 2017-03-06 DIAGNOSIS — R0789 Other chest pain: Secondary | ICD-10-CM | POA: Insufficient documentation

## 2017-03-06 DIAGNOSIS — K029 Dental caries, unspecified: Secondary | ICD-10-CM

## 2017-03-06 LAB — MYOCARDIAL PERFUSION IMAGING
Estimated workload: 7 METS
Exercise duration (min): 6 min
Exercise duration (sec): 0 s
LHR: 0.45
LVDIAVOL: 81 mL (ref 46–106)
LVSYSVOL: 42 mL
MPHR: 166 {beats}/min
NUC STRESS TID: 0.81
Peak HR: 148 {beats}/min
Percent HR: 89 %
Rest HR: 86 {beats}/min
SDS: 2
SRS: 11
SSS: 9

## 2017-03-06 MED ORDER — TECHNETIUM TC 99M TETROFOSMIN IV KIT
30.9000 | PACK | Freq: Once | INTRAVENOUS | Status: AC | PRN
  Administered 2017-03-06: 30.9 via INTRAVENOUS
  Filled 2017-03-06: qty 31

## 2017-03-06 MED ORDER — TECHNETIUM TC 99M TETROFOSMIN IV KIT
10.2000 | PACK | Freq: Once | INTRAVENOUS | Status: AC | PRN
  Administered 2017-03-06: 10.2 via INTRAVENOUS
  Filled 2017-03-06: qty 11

## 2017-03-06 NOTE — Telephone Encounter (Signed)
Pt is aware. Pt states she is not having any symptoms but she knows she needs to see a dentist

## 2017-03-06 NOTE — Telephone Encounter (Signed)
Patient was called and informed of lab results and medication that was sent pharmacy.

## 2017-03-06 NOTE — Telephone Encounter (Signed)
Referral placed. If she is experiences signs and symptoms of abscess (facial swelling, drainage, fever) she needs to be seen in office. Please make pt.aware of waiting list for dental referrals. If any resources available for upcoming dental referrals please provide patient.

## 2017-03-06 NOTE — Telephone Encounter (Signed)
Pt came to the office to request a referral for the dentis, she has [employer] and [employer] insurance, please follow up

## 2017-03-09 ENCOUNTER — Other Ambulatory Visit: Payer: Self-pay

## 2017-03-09 ENCOUNTER — Encounter (HOSPITAL_COMMUNITY): Payer: Self-pay

## 2017-03-09 ENCOUNTER — Encounter (HOSPITAL_COMMUNITY)
Admission: RE | Admit: 2017-03-09 | Discharge: 2017-03-09 | Disposition: A | Discharge status: Home / Self Care | Payer: Self-pay | Source: Ambulatory Visit | Attending: Obstetrics & Gynecology | Admitting: Obstetrics & Gynecology

## 2017-03-09 DIAGNOSIS — N938 Other specified abnormal uterine and vaginal bleeding: Secondary | ICD-10-CM | POA: Insufficient documentation

## 2017-03-09 DIAGNOSIS — Z01818 Encounter for other preprocedural examination: Secondary | ICD-10-CM | POA: Insufficient documentation

## 2017-03-09 HISTORY — DX: Dyspnea, unspecified: R06.00

## 2017-03-09 HISTORY — DX: Missed abortion: O02.1

## 2017-03-09 HISTORY — DX: Hyperlipidemia, unspecified: E78.5

## 2017-03-09 LAB — ABO/RH: ABO/RH(D): O POS

## 2017-03-09 LAB — CBC
HCT: 38.2 % (ref 36.0–46.0)
Hemoglobin: 12.7 g/dL (ref 12.0–15.0)
MCH: 30.2 pg (ref 26.0–34.0)
MCHC: 33.2 g/dL (ref 30.0–36.0)
MCV: 90.7 fL (ref 78.0–100.0)
PLATELETS: 276 10*3/uL (ref 150–400)
RBC: 4.21 MIL/uL (ref 3.87–5.11)
RDW: 12.6 % (ref 11.5–15.5)
WBC: 5.8 10*3/uL (ref 4.0–10.5)

## 2017-03-09 LAB — BASIC METABOLIC PANEL
Anion gap: 8 (ref 5–15)
BUN: 9 mg/dL (ref 6–20)
CALCIUM: 9.1 mg/dL (ref 8.9–10.3)
CHLORIDE: 105 mmol/L (ref 101–111)
CO2: 23 mmol/L (ref 22–32)
CREATININE: 0.83 mg/dL (ref 0.44–1.00)
GFR calc Af Amer: >60 mL/min (ref >60)
GFR calc non Af Amer: >60 mL/min (ref >60)
GLUCOSE: 101 mg/dL — AB (ref 65–99)
Potassium: 3.3 mmol/L — ABNORMAL LOW (ref 3.5–5.1)
Sodium: 136 mmol/L (ref 135–145)

## 2017-03-09 LAB — TYPE AND SCREEN
ABO/RH(D): O POS
Antibody Screen: NEGATIVE

## 2017-03-09 NOTE — Pre-Procedure Instructions (Signed)
SDS BB History Log given to lab for patient's previous history of blood transfusion 12/2014 at [facility].

## 2017-03-09 NOTE — Pre-Procedure Instructions (Addendum)
Staff message sent to Lyda Jester, PC-C at [facility] Office to see if patient was cleared for surgery after her Stress Test on Friday, Mar 07, 2017 at 0730.  Waiting for response.    I also sent a staff message to Fredia Beets, [place] at [facility] requesting surgical clearance.

## 2017-03-10 ENCOUNTER — Encounter: Payer: Self-pay | Admitting: General Practice

## 2017-03-10 ENCOUNTER — Other Ambulatory Visit: Payer: Self-pay | Admitting: General Practice

## 2017-03-10 ENCOUNTER — Telehealth: Payer: Self-pay | Admitting: Cardiology

## 2017-03-10 DIAGNOSIS — E876 Hypokalemia: Secondary | ICD-10-CM

## 2017-03-10 MED ORDER — POTASSIUM CHLORIDE CRYS ER 20 MEQ PO TBCR: 20.0000 meq | EXTENDED_RELEASE_TABLET | Freq: Two times a day (BID) | ORAL | 0 refills | Status: DC

## 2017-03-10 NOTE — Pre-Procedure Instructions (Signed)
CALLED OFFICE OF BRITTAINY SIMMONS TO FOLLOW UP ON CARDIAC CLEARANCE FOR Nina Hopkins

## 2017-03-10 NOTE — Telephone Encounter (Signed)
New Message       [facility] Pre-operative Risk Assessment    Request for surgical clearance:  1. What type of surgery is being performed? hysterctomy    2. When is this surgery scheduled? 03/17/2017    3. What type of clearance is required (medical clearance vs. Pharmacy clearance to hold med vs. Both)? Medical   4. Are there any medications that need to be held prior to surgery and how long? 5. Practice name and name of physician performing surgery? Dr, Clovia Cuff  [facility]  What is your office phone and fax number? [phone] (office) [phone] (fax) Anesthesia type (None, local, MAC, general) ? General   Avaletta L Williams 03/10/2017, 3:58 PM  _________________________________________________________________   (provider comments below)

## 2017-03-11 NOTE — Telephone Encounter (Signed)
Spoke with Dr Audie Box office and preop clearance has been received.

## 2017-03-11 NOTE — Telephone Encounter (Signed)
Clinic note providing clearance routed to surgeon.  Preop call back pool, please call office and make sure they received this documentation.

## 2017-03-16 ENCOUNTER — Telehealth: Payer: Self-pay | Admitting: *Deleted

## 2017-03-16 NOTE — Telephone Encounter (Signed)
Received a message from admitting nurse that patient may come as scheduled even with cold symptoms if no fever. Called Nina Hopkins and notified her of advice from admitting nurse. She voices understanding.

## 2017-03-16 NOTE — Telephone Encounter (Signed)
Nina Hopkins called today and left a voicemail that she is scheduled for surgery tomorrow with Dr.Dove and she has some cold symptoms and is not sure what she should do.  I called Nina Hopkins admitting nurse and left a message for them to call Nina Hopkins to discuss her symptoms. I called Nina Hopkins and she denies fever, states she has a sore throat and stuffy nose- feels like is beginning of a cold. I informed her I knew if she had a fever we would need to reschedule but that I wasn't sure otherwise. . I also informed her I have left a message with the admitting nurse to call her back and discuss her symptoms and I also gave her the admitting nurse phone number. She voices understanding.

## 2017-03-17 ENCOUNTER — Ambulatory Visit (HOSPITAL_COMMUNITY): Payer: Self-pay | Admitting: Anesthesiology

## 2017-03-17 ENCOUNTER — Other Ambulatory Visit: Payer: Self-pay

## 2017-03-17 ENCOUNTER — Encounter (HOSPITAL_COMMUNITY): Payer: Self-pay

## 2017-03-17 ENCOUNTER — Inpatient Hospital Stay (HOSPITAL_COMMUNITY)
Admission: AD | Admit: 2017-03-17 | Discharge: 2017-03-19 | DRG: 743 | Disposition: A | Discharge status: Home / Self Care | Payer: Self-pay | Source: Ambulatory Visit | Attending: Obstetrics & Gynecology | Admitting: Obstetrics & Gynecology

## 2017-03-17 ENCOUNTER — Encounter (HOSPITAL_COMMUNITY)
Admission: AD | Disposition: A | Discharge status: Home / Self Care | Payer: Self-pay | Source: Ambulatory Visit | Attending: Obstetrics & Gynecology

## 2017-03-17 DIAGNOSIS — N920 Excessive and frequent menstruation with regular cycle: Secondary | ICD-10-CM | POA: Diagnosis present

## 2017-03-17 DIAGNOSIS — I10 Essential (primary) hypertension: Secondary | ICD-10-CM | POA: Diagnosis present

## 2017-03-17 DIAGNOSIS — D259 Leiomyoma of uterus, unspecified: Principal | ICD-10-CM | POA: Diagnosis present

## 2017-03-17 DIAGNOSIS — E669 Obesity, unspecified: Secondary | ICD-10-CM | POA: Diagnosis present

## 2017-03-17 DIAGNOSIS — Z23 Encounter for immunization: Secondary | ICD-10-CM

## 2017-03-17 DIAGNOSIS — Z6835 Body mass index (BMI) 35.0-35.9, adult: Secondary | ICD-10-CM

## 2017-03-17 DIAGNOSIS — Z9889 Other specified postprocedural states: Secondary | ICD-10-CM

## 2017-03-17 HISTORY — PX: ABDOMINAL HYSTERECTOMY: SHX81

## 2017-03-17 HISTORY — PX: SALPINGOOPHORECTOMY: SHX82

## 2017-03-17 HISTORY — PX: CYSTOSCOPY: SHX5120

## 2017-03-17 SURGERY — HYSTERECTOMY, ABDOMINAL
Anesthesia: General | Site: Bladder

## 2017-03-17 MED ORDER — HYDROMORPHONE HCL 1 MG/ML IJ SOLN
INTRAMUSCULAR | Status: DC | PRN
  Administered 2017-03-17 (×2): 0.5 mg via INTRAVENOUS

## 2017-03-17 MED ORDER — METHYLENE BLUE 0.5 % INJ SOLN
INTRAVENOUS | Status: AC
  Filled 2017-03-17: qty 10

## 2017-03-17 MED ORDER — LACTATED RINGERS IV SOLN
INTRAVENOUS | Status: DC
  Administered 2017-03-17 – 2017-03-18 (×2): via INTRAVENOUS

## 2017-03-17 MED ORDER — SUGAMMADEX SODIUM 200 MG/2ML IV SOLN
INTRAVENOUS | Status: DC | PRN
  Administered 2017-03-17: 200 mg via INTRAVENOUS

## 2017-03-17 MED ORDER — LOSARTAN POTASSIUM 50 MG PO TABS
50.0000 mg | ORAL_TABLET | Freq: Every day | ORAL | Status: DC
Start: 2017-03-17 — End: 2017-03-19
  Administered 2017-03-17 – 2017-03-18 (×2): 50 mg via ORAL
  Filled 2017-03-17 (×2): qty 1

## 2017-03-17 MED ORDER — ONDANSETRON HCL 4 MG/2ML IJ SOLN
INTRAMUSCULAR | Status: AC
  Filled 2017-03-17: qty 2

## 2017-03-17 MED ORDER — LIDOCAINE HCL (CARDIAC) 20 MG/ML IV SOLN
INTRAVENOUS | Status: DC | PRN
  Administered 2017-03-17: 80 mg via INTRAVENOUS

## 2017-03-17 MED ORDER — GABAPENTIN 300 MG PO CAPS
300.0000 mg | ORAL_CAPSULE | Freq: Once | ORAL | Status: AC
  Administered 2017-03-17: 300 mg via ORAL

## 2017-03-17 MED ORDER — FUROSEMIDE 10 MG/ML IJ SOLN
INTRAMUSCULAR | Status: AC
  Filled 2017-03-17: qty 2

## 2017-03-17 MED ORDER — KETOROLAC TROMETHAMINE 30 MG/ML IJ SOLN
INTRAMUSCULAR | Status: AC
  Filled 2017-03-17: qty 1

## 2017-03-17 MED ORDER — MIDAZOLAM HCL 2 MG/2ML IJ SOLN
INTRAMUSCULAR | Status: AC
  Filled 2017-03-17: qty 2

## 2017-03-17 MED ORDER — HYDROMORPHONE HCL 1 MG/ML IJ SOLN
0.2000 mg | INTRAMUSCULAR | Status: DC | PRN
  Administered 2017-03-17 – 2017-03-18 (×3): 0.6 mg via INTRAVENOUS
  Filled 2017-03-17 (×3): qty 1

## 2017-03-17 MED ORDER — LACTATED RINGERS IV SOLN
INTRAVENOUS | Status: DC
  Administered 2017-03-17: 1000 mL via INTRAVENOUS
  Administered 2017-03-17 (×2): via INTRAVENOUS

## 2017-03-17 MED ORDER — METHYLENE BLUE 0.5 % INJ SOLN
INTRAVENOUS | Status: DC | PRN
  Administered 2017-03-17 (×2): 5 mL via INTRAVENOUS

## 2017-03-17 MED ORDER — POTASSIUM CHLORIDE CRYS ER 20 MEQ PO TBCR
20.0000 meq | EXTENDED_RELEASE_TABLET | Freq: Two times a day (BID) | ORAL | Status: DC
  Administered 2017-03-17 – 2017-03-19 (×3): 20 meq via ORAL
  Filled 2017-03-17 (×4): qty 1

## 2017-03-17 MED ORDER — ACETAMINOPHEN 500 MG PO TABS
1000.0000 mg | ORAL_TABLET | Freq: Once | ORAL | Status: AC
  Administered 2017-03-17: 1000 mg via ORAL

## 2017-03-17 MED ORDER — ROCURONIUM BROMIDE 100 MG/10ML IV SOLN
INTRAVENOUS | Status: DC | PRN
  Administered 2017-03-17: 10 mg via INTRAVENOUS
  Administered 2017-03-17: 40 mg via INTRAVENOUS

## 2017-03-17 MED ORDER — PROMETHAZINE HCL 25 MG/ML IJ SOLN: 6.2500 mg | INTRAMUSCULAR | Status: DC | PRN

## 2017-03-17 MED ORDER — SUGAMMADEX SODIUM 200 MG/2ML IV SOLN
INTRAVENOUS | Status: AC
  Filled 2017-03-17: qty 2

## 2017-03-17 MED ORDER — KETOROLAC TROMETHAMINE 30 MG/ML IJ SOLN
INTRAMUSCULAR | Status: DC | PRN
  Administered 2017-03-17: 30 mg via INTRAVENOUS

## 2017-03-17 MED ORDER — SCOPOLAMINE 1 MG/3DAYS TD PT72
1.0000 | MEDICATED_PATCH | Freq: Once | TRANSDERMAL | Status: DC
  Administered 2017-03-17: 1.5 mg via TRANSDERMAL

## 2017-03-17 MED ORDER — FENTANYL CITRATE (PF) 100 MCG/2ML IJ SOLN
INTRAMUSCULAR | Status: DC | PRN
  Administered 2017-03-17 (×2): 100 ug via INTRAVENOUS
  Administered 2017-03-17: 50 ug via INTRAVENOUS

## 2017-03-17 MED ORDER — PROPOFOL 10 MG/ML IV BOLUS
INTRAVENOUS | Status: DC | PRN
  Administered 2017-03-17: 180 mg via INTRAVENOUS
  Administered 2017-03-17: 20 mg via INTRAVENOUS

## 2017-03-17 MED ORDER — FUROSEMIDE 10 MG/ML IJ SOLN
INTRAMUSCULAR | Status: DC | PRN
  Administered 2017-03-17: 10 mg via INTRAMUSCULAR

## 2017-03-17 MED ORDER — STERILE WATER FOR IRRIGATION IR SOLN
Status: DC | PRN
  Administered 2017-03-17: 1000 mL via INTRAVESICAL

## 2017-03-17 MED ORDER — CEFAZOLIN SODIUM-DEXTROSE 2-4 GM/100ML-% IV SOLN
2.0000 g | INTRAVENOUS | Status: AC
  Administered 2017-03-17: 2 g via INTRAVENOUS

## 2017-03-17 MED ORDER — FENTANYL CITRATE (PF) 100 MCG/2ML IJ SOLN: 25.0000 ug | INTRAMUSCULAR | Status: DC | PRN

## 2017-03-17 MED ORDER — PHENYLEPHRINE HCL 10 MG/ML IJ SOLN
INTRAMUSCULAR | Status: DC | PRN
  Administered 2017-03-17 (×5): 80 mg via INTRAVENOUS

## 2017-03-17 MED ORDER — GABAPENTIN 300 MG PO CAPS
ORAL_CAPSULE | ORAL | Status: AC
  Filled 2017-03-17: qty 1

## 2017-03-17 MED ORDER — ONDANSETRON HCL 4 MG PO TABS: 4.0000 mg | ORAL_TABLET | Freq: Four times a day (QID) | ORAL | Status: DC | PRN

## 2017-03-17 MED ORDER — ACETAMINOPHEN 500 MG PO TABS
ORAL_TABLET | ORAL | Status: AC
  Filled 2017-03-17: qty 2

## 2017-03-17 MED ORDER — LIDOCAINE HCL (PF) 1 % IJ SOLN
INTRAMUSCULAR | Status: AC
  Filled 2017-03-17: qty 5

## 2017-03-17 MED ORDER — IBUPROFEN 600 MG PO TABS
600.0000 mg | ORAL_TABLET | Freq: Three times a day (TID) | ORAL | Status: DC | PRN
  Administered 2017-03-18 (×2): 600 mg via ORAL
  Filled 2017-03-17 (×3): qty 1

## 2017-03-17 MED ORDER — PHENYLEPHRINE 40 MCG/ML (10ML) SYRINGE FOR IV PUSH (FOR BLOOD PRESSURE SUPPORT)
PREFILLED_SYRINGE | INTRAVENOUS | Status: AC
  Filled 2017-03-17: qty 10

## 2017-03-17 MED ORDER — PROPOFOL 10 MG/ML IV BOLUS
INTRAVENOUS | Status: AC
  Filled 2017-03-17: qty 20

## 2017-03-17 MED ORDER — MIDAZOLAM HCL 2 MG/2ML IJ SOLN
INTRAMUSCULAR | Status: DC | PRN
  Administered 2017-03-17: 2 mg via INTRAVENOUS

## 2017-03-17 MED ORDER — SCOPOLAMINE 1 MG/3DAYS TD PT72
MEDICATED_PATCH | TRANSDERMAL | Status: AC
  Administered 2017-03-17: 1.5 mg via TRANSDERMAL
  Filled 2017-03-17: qty 1

## 2017-03-17 MED ORDER — DEXAMETHASONE SODIUM PHOSPHATE 4 MG/ML IJ SOLN
INTRAMUSCULAR | Status: AC
  Filled 2017-03-17: qty 1

## 2017-03-17 MED ORDER — BUPIVACAINE HCL (PF) 0.5 % IJ SOLN
INTRAMUSCULAR | Status: AC
  Filled 2017-03-17: qty 30

## 2017-03-17 MED ORDER — BUPIVACAINE HCL (PF) 0.5 % IJ SOLN
INTRAMUSCULAR | Status: DC | PRN
  Administered 2017-03-17: 30 mL

## 2017-03-17 MED ORDER — ONDANSETRON HCL 4 MG/2ML IJ SOLN: 4.0000 mg | Freq: Four times a day (QID) | INTRAMUSCULAR | Status: DC | PRN

## 2017-03-17 MED ORDER — CEFAZOLIN SODIUM-DEXTROSE 2-4 GM/100ML-% IV SOLN
INTRAVENOUS | Status: AC
  Filled 2017-03-17: qty 100

## 2017-03-17 MED ORDER — DEXAMETHASONE SODIUM PHOSPHATE 10 MG/ML IJ SOLN
INTRAMUSCULAR | Status: DC | PRN
  Administered 2017-03-17: 4 mg via INTRAVENOUS

## 2017-03-17 MED ORDER — HYDROCHLOROTHIAZIDE 25 MG PO TABS
25.0000 mg | ORAL_TABLET | Freq: Every day | ORAL | Status: DC
  Administered 2017-03-18 – 2017-03-19 (×2): 25 mg via ORAL
  Filled 2017-03-17 (×2): qty 1

## 2017-03-17 MED ORDER — HYDROMORPHONE HCL 1 MG/ML IJ SOLN
INTRAMUSCULAR | Status: AC
  Filled 2017-03-17: qty 1

## 2017-03-17 MED ORDER — ONDANSETRON HCL 4 MG/2ML IJ SOLN
INTRAMUSCULAR | Status: DC | PRN
  Administered 2017-03-17: 4 mg via INTRAVENOUS

## 2017-03-17 MED ORDER — FENTANYL CITRATE (PF) 250 MCG/5ML IJ SOLN
INTRAMUSCULAR | Status: AC
  Filled 2017-03-17: qty 5

## 2017-03-17 SURGICAL SUPPLY — 34 items
BENZOIN TINCTURE PRP APPL 2/3 (GAUZE/BANDAGES/DRESSINGS) ×5 IMPLANT
CANISTER SUCT 3000ML PPV (MISCELLANEOUS) ×5 IMPLANT
CLOSURE WOUND 1/2 X4 (GAUZE/BANDAGES/DRESSINGS) ×1
CLOTH BEACON ORANGE TIMEOUT ST (SAFETY) ×5 IMPLANT
CONT PATH 16OZ SNAP LID 3702 (MISCELLANEOUS) ×5 IMPLANT
DECANTER SPIKE VIAL GLASS SM (MISCELLANEOUS) ×5 IMPLANT
DRAPE CESAREAN BIRTH W POUCH (DRAPES) ×5 IMPLANT
DRAPE WARM FLUID 44X44 (DRAPE) IMPLANT
DRSG OPSITE POSTOP 4X10 (GAUZE/BANDAGES/DRESSINGS) ×5 IMPLANT
DURAPREP 26ML APPLICATOR (WOUND CARE) ×5 IMPLANT
GAUZE SPONGE 4X4 16PLY XRAY LF (GAUZE/BANDAGES/DRESSINGS) ×5 IMPLANT
GLOVE BIO SURGEON STRL SZ 6.5 (GLOVE) ×4 IMPLANT
GLOVE BIO SURGEONS STRL SZ 6.5 (GLOVE) ×1
GLOVE BIOGEL PI IND STRL 7.0 (GLOVE) ×6 IMPLANT
GLOVE BIOGEL PI INDICATOR 7.0 (GLOVE) ×4
GOWN STRL REUS W/TWL LRG LVL3 (GOWN DISPOSABLE) ×15 IMPLANT
HEMOSTAT ARISTA ABSORB 3G PWDR (MISCELLANEOUS) IMPLANT
NEEDLE SPNL 18GX3.5 QUINCKE PK (NEEDLE) ×5 IMPLANT
NS IRRIG 1000ML POUR BTL (IV SOLUTION) ×5 IMPLANT
PACK ABDOMINAL GYN (CUSTOM PROCEDURE TRAY) ×5 IMPLANT
PAD OB MATERNITY 4.3X12.25 (PERSONAL CARE ITEMS) ×5 IMPLANT
PROTECTOR NERVE ULNAR (MISCELLANEOUS) ×5 IMPLANT
SET CYSTO W/LG BORE CLAMP LF (SET/KITS/TRAYS/PACK) ×5 IMPLANT
SPONGE LAP 18X18 X RAY DECT (DISPOSABLE) ×10 IMPLANT
STRIP CLOSURE SKIN 1/2X4 (GAUZE/BANDAGES/DRESSINGS) ×4 IMPLANT
SUT CHROMIC 3 0 SH 27 (SUTURE) IMPLANT
SUT PDS AB 0 CTX 60 (SUTURE) IMPLANT
SUT VIC AB 0 CT1 36 (SUTURE) ×10 IMPLANT
SUT VIC AB 2-0 CT1 18 (SUTURE) ×15 IMPLANT
SUT VIC AB 3-0 CT1 27 (SUTURE) ×2
SUT VIC AB 3-0 CT1 TAPERPNT 27 (SUTURE) ×3 IMPLANT
SYR 30ML LL (SYRINGE) ×5 IMPLANT
TOWEL OR 17X24 6PK STRL BLUE (TOWEL DISPOSABLE) ×10 IMPLANT
TRAY FOLEY CATH SILVER 14FR (SET/KITS/TRAYS/PACK) ×10 IMPLANT

## 2017-03-17 NOTE — Anesthesia Preprocedure Evaluation (Addendum)
Anesthesia Evaluation  Patient identified by MRN, date of birth, ID band Patient awake    Reviewed: Allergy & Precautions, NPO status , Patient's Chart, lab work & pertinent test results  Airway Mallampati: II  TM Distance: >3 FB Neck ROM: Full    Dental  (+) Teeth Intact, Dental Advisory Given, Partial Upper, Caps   Pulmonary neg pulmonary ROS,    Pulmonary exam normal breath sounds clear to auscultation       Cardiovascular hypertension, Pt. on medications Normal cardiovascular exam Rhythm:Regular Rate:Normal  Echo 02/23/17: Study Conclusions  - Left ventricle: LV global longitudinal strain is -17% The cavity size was normal. Wall thickness was increased in a pattern of mild LVH. Systolic function was normal. The estimated ejection fraction was in the range of 60% to 65%. The study is not technically sufficient to allow evaluation of LV diastolic function. - Left atrium: The atrium was mildly dilated. - Tricuspid valve: There was mild-moderate regurgitation. - Pericardium, extracardiac: A trivial pericardial effusion was identified.   Neuro/Psych PSYCHIATRIC DISORDERS Anxiety negative neurological ROS     GI/Hepatic negative GI ROS, Neg liver ROS,   Endo/Other  Obesity   Renal/GU negative Renal ROS     Musculoskeletal negative musculoskeletal ROS (+)   Abdominal   Peds  Hematology negative hematology ROS (+)   Anesthesia Other Findings Day of surgery medications reviewed with the patient.  Reproductive/Obstetrics AUB                           Anesthesia Physical Anesthesia Plan  ASA: II  Anesthesia Plan: General   Post-op Pain Management:    Induction: Intravenous  PONV Risk Score and Plan: 4 or greater and Scopolamine patch - Pre-op, Midazolam, Dexamethasone and Ondansetron  Airway Management Planned: Oral ETT  Additional Equipment:   Intra-op Plan:   Post-operative Plan:  Extubation in OR  Informed Consent: I have reviewed the patients History and Physical, chart, labs and discussed the procedure including the risks, benefits and alternatives for the proposed anesthesia with the patient or authorized representative who has indicated his/her understanding and acceptance.   Dental advisory given  Plan Discussed with: CRNA  Anesthesia Plan Comments: (Risks/benefits of general anesthesia discussed with patient including risk of damage to teeth, lips, gum, and tongue, nausea/vomiting, allergic reactions to medications, and the possibility of heart attack, stroke and death.  All patient questions answered.  Patient wishes to proceed.)        Anesthesia Quick Evaluation

## 2017-03-17 NOTE — Transfer of Care (Signed)
Immediate Anesthesia Transfer of Care Note  Patient: Nina Hopkins  Procedure(s) Performed: HYSTERECTOMY ABDOMINAL (N/A Abdomen) SALPINGO OOPHORECTOMY (Bilateral Abdomen) CYSTOSCOPY (N/A Bladder)  Patient Location: PACU  Anesthesia Type:General  Level of Consciousness: sedated  Airway & Oxygen Therapy: Patient Spontanous Breathing and Patient connected to nasal cannula oxygen  Post-op Assessment: Report given to RN  Post vital signs: Reviewed and stable  Last Vitals:  Vitals:   03/17/17 1324  BP: (!) 154/98  Pulse: 87  Resp: 18  Temp: 37.1 C  SpO2: 99%    Last Pain:  Vitals:   03/17/17 1324  TempSrc: Oral         Complications: No apparent anesthesia complications

## 2017-03-17 NOTE — Op Note (Signed)
03/17/2017  4:42 PM  PATIENT:  Nina Hopkins  57 y.o. female  PRE-OPERATIVE DIAGNOSIS:  DUB, fibroids  POST-OPERATIVE DIAGNOSIS: same  PROCEDURE:  Procedure(s): HYSTERECTOMY ABDOMINAL (N/A) SALPINGO OOPHORECTOMY (Bilateral) CYSTOSCOPY (N/A)  SURGEON:  Surgeon(s) and Role:    * Oktober Glazer, Wilhemina Cash, MD - Primary    * Anyanwu, Sallyanne Havers, MD - Assisting  ASSISTANTS: Rufina Falco, MS 3   ANESTHESIA:   local and general  EBL:  75 mL   BLOOD ADMINISTERED:none  DRAINS: none   LOCAL MEDICATIONS USED:  MARCAINE     SPECIMEN:  Source of Specimen:  uterus, tubes, ovaries  DISPOSITION OF SPECIMEN:  PATHOLOGY  COUNTS:  YES  TOURNIQUET:  * No tourniquets in log *  DICTATION: .Dragon Dictation  PLAN OF CARE: Admit to inpatient   PATIENT DISPOSITION:  PACU - hemodynamically stable.   Delay start of Pharmacological VTE agent (>24hrs) due to surgical blood loss or risk of bleeding: not applicable     The risks, benefits, and alternatives of surgery were explained, understood, and accepted. Consents were signed. All questions were answered. She was taken to the operating room and general anesthesia was applied without complication. Her abdomen and vagina were prepped and draped in the usual sterile fashion. A Foley catheter was placed which drained clear urine throughout the case. After adequate anesthesia was assured a transverse incision was made approximately 2 cm above her symphysis pubis after injecting 30 mL of 0.5% marcaine in the subcutaneous tissue. The incision was carried down through the subcutaneous tissue to the fascia. Bleeding encountered was cauterized with the Bovie. The fascia was scored the midline and the fascial incision was extended bilaterally. The pyramidalis muscles were separated in a transverse fashion using electrosurgical technique. Approximately 2 cm of the rectus muscles were separated in a transverse fashion in the midline using electrosurgical technique.  Hemostasis was maintained. The peritoneum was entered with hemostats and the peritoneal incision was extended bilaterally with the Bovie, taking care to avoid bowel and bladder. The patient was placed in Trendelenburg position and her bowel was packed out of the abdominal cavity. The pelvis was inspected. Her very large uterus filled the entire pelvis. I used towel clamps to elevate the uterus out of the incision. Coker clamps were used to elevate the uterus. The round ligaments were identified clamped cut and ligated. A bladder flap was created anteriorly and the bladder was pushed out of the operative site with a moist lap sponge. The uteroovarian ligaments were identified bilaterally. They were clamped, cut, and ligated. Excellent hemostasis was noted. 2-0 Vicryl sutures used throughout this case unless otherwise specified. The uterine vessels were skeletonized, clamped, cut, and doubly ligated. A bladder flap was created anteriorly. The remainder of the cervix was separated from its pelvic attachments using the same clamp, cut, ligate technique. Curved Heaney clamps were used to clamp beneath the cervix. The cervix and uterus were removed and sent to pathology. The vaginal cuff was noted to be hemostatic after placing 2 figure of eight sutures. The pelvis was irrigated with 1 L of warm normal saline. All pedicles were noted to be hemostatic. The ureters were noted to be of normal caliber but I did not see peristalsis so she was given methelyne blue. The sponges were removed from the pelvis. The rectus muscles were inspected and hemostasis was assured. The fascia was closed with a 0 Vicryl running nonlocking suture. The subcutaneous tissue was irrigated, clean, dry.  A subcuticular closure was done with  3-0 vicryl suture. I then did a cystoscopy which showed blue urine to be coming out of each ureter. She was extubated. She tolerated the procedure well and was taken to the recovery room in stable condition. Her  Foley catheter drained clear urine throughout.

## 2017-03-17 NOTE — Anesthesia Procedure Notes (Signed)
Procedure Name: Intubation Date/Time: 03/17/2017 3:12 PM Performed by: Asher Muir, CRNA Pre-anesthesia Checklist: Patient identified, Emergency Drugs available, Suction available and Patient being monitored Patient Re-evaluated:Patient Re-evaluated prior to induction Oxygen Delivery Method: Circle system utilized and Simple face mask Preoxygenation: Pre-oxygenation with 100% oxygen Induction Type: IV induction Ventilation: Mask ventilation without difficulty Laryngoscope Size: Mac and 3 Grade View: Grade III Tube type: Oral Tube size: 7.0 mm Number of attempts: 1 Airway Equipment and Method: Stylet Placement Confirmation: ETT inserted through vocal cords under direct vision,  positive ETCO2 and breath sounds checked- equal and bilateral Secured at: 20 (right lip) cm Tube secured with: Tape Dental Injury: Teeth and Oropharynx as per pre-operative assessment

## 2017-03-17 NOTE — H&P (Signed)
Nina Hopkins is a 57 y.o.single African American P2 female here today for a TAH/BSO for heavy vaginal bleeding. For the past year she has been have uncharacteristically heavy cycles. Then she had three months of no periods.  Then one month ago started to have another cycle that did not stop.  She has been passing huge clots "the size of body parts".  She feels like she has been having contractions to pass these clots.  She went to the ER 11/19 for this bleeding, where they found hemoglobin of 7.4. Wet prep, HCG, GC/Chlam were negative, BMP was benign.  She was prescribed iron pills and Megace and discharged.  She was taking Megace three times a day until the bleeding stopped (about three days).  Since, she has been taking it once per day.  Yesterday the bleeding started again, primarily spotting/light cycle.    She has had a blood transfusion in the past but doesn't feel like she needs one now.   Patient's last menstrual period was 03/02/2017 (approximate).    Past Medical History:  Diagnosis Date  . Anemia   . Anxiety ~ 2008   no meds  . Dyspnea    hx - r/t anemia per patient  . Family history of adverse reaction to anesthesia    "my sister had a hard time waking up"  . History of blood transfusion 12/20/2014   related to anemia  . Hyperlipidemia   . Hypertension   . Missed ab    x 1 - no surgery required  . SVD (spontaneous vaginal delivery)    x 2    Past Surgical History:  Procedure Laterality Date  . CERVIX LESION DESTRUCTION     cryro  . DILATION AND CURETTAGE, DIAGNOSTIC / THERAPEUTIC      in office  . TUBAL LIGATION  1990's    Family History  Problem Relation Age of Onset  . Cancer Mother        BRAIN TUMOR, VULVAR  . Hypertension Mother   . Diabetes Mother   . Cancer Father        PENILE  . Cancer Maternal Grandmother        LUNG  . Cancer Maternal Grandfather        COLON    Social History:  reports that  has never smoked. she has never used  smokeless tobacco. She reports that she drinks alcohol. She reports that she does not use drugs.  Allergies:  Allergies  Allergen Reactions  . Prilosec [Omeprazole]     Stiff neck    Medications Prior to Admission  Medication Sig Dispense Refill Last Dose  . ferrous sulfate 325 (65 FE) MG tablet Take 1 tablet (325 mg total) 3 (three) times daily with meals by mouth. 90 tablet 3 03/16/2017 at Unknown time  . hydrochlorothiazide (HYDRODIURIL) 25 MG tablet Take 1 tablet (25 mg total) daily by mouth. 90 tablet 3 03/16/2017 at Unknown time  . losartan (COZAAR) 50 MG tablet Take 1 tablet (50 mg total) by mouth daily. 30 tablet 2 03/16/2017 at Unknown time  . megestrol (MEGACE) 40 MG tablet Take 1 tablet (40 mg total) by mouth 2 (two) times daily. 60 tablet 2 03/16/2017 at Unknown time  . pravastatin (PRAVACHOL) 20 MG tablet Take 1 tablet (20 mg total) by mouth daily. 30 tablet 2 03/17/2017 at 0630  . Vitamin D, Ergocalciferol, (DRISDOL) 50000 units CAPS capsule Take 1 capsule (50,000 Units total) by mouth every 7 (seven) days. 16 capsule  0 Past Week at Unknown time  . ibuprofen (ADVIL,MOTRIN) 600 MG tablet Take 1 tablet (600 mg total) by mouth every 8 (eight) hours as needed for mild pain. 30 tablet 0 More than a month at Unknown time  . potassium chloride SA (K-DUR,KLOR-CON) 20 MEQ tablet Take 1 tablet (20 mEq total) by mouth 2 (two) times daily. 60 tablet 0     ROS  Blood pressure (!) 154/98, pulse 87, temperature 98.8 F (37.1 C), temperature source Oral, resp. rate 18, last menstrual period 03/02/2017, SpO2 99 %. Physical Exam Pleasant Black female Breathing, conversing, and ambulating normally Heart- rrr Lungs- CTAB Abd- benign Uterus- palpable to umbilicus  No results found for this or any previous visit (from the past 24 hour(s)).  No results found.  Assessment/Plan: Menorrhagia, fibroids- plan for TAH/BSO.  She understands the risks of surgery, including, but not to infection,  bleeding, DVTs, damage to bowel, bladder, ureters. She wishes to proceed.     Emily Filbert 03/17/2017, 2:08 PM

## 2017-03-18 ENCOUNTER — Encounter (HOSPITAL_COMMUNITY): Payer: Self-pay | Admitting: Obstetrics & Gynecology

## 2017-03-18 LAB — CBC
HCT: 33.5 % — ABNORMAL LOW (ref 36.0–46.0)
HEMOGLOBIN: 11.1 g/dL — AB (ref 12.0–15.0)
MCH: 29.7 pg (ref 26.0–34.0)
MCHC: 33.1 g/dL (ref 30.0–36.0)
MCV: 89.6 fL (ref 78.0–100.0)
Platelets: 216 10*3/uL (ref 150–400)
RBC: 3.74 MIL/uL — AB (ref 3.87–5.11)
RDW: 12.6 % (ref 11.5–15.5)
WBC: 9.2 10*3/uL (ref 4.0–10.5)

## 2017-03-18 MED ORDER — OXYCODONE-ACETAMINOPHEN 5-325 MG PO TABS: 1.0000 | ORAL_TABLET | ORAL | 0 refills | Status: DC | PRN

## 2017-03-18 MED ORDER — OXYCODONE-ACETAMINOPHEN 5-325 MG PO TABS
2.0000 | ORAL_TABLET | ORAL | Status: DC | PRN
  Administered 2017-03-18 – 2017-03-19 (×2): 2 via ORAL
  Filled 2017-03-18 (×3): qty 2

## 2017-03-18 MED ORDER — OXYCODONE-ACETAMINOPHEN 5-325 MG PO TABS
1.0000 | ORAL_TABLET | ORAL | Status: DC | PRN
  Administered 2017-03-18 (×2): 1 via ORAL
  Filled 2017-03-18: qty 1

## 2017-03-18 MED ORDER — INFLUENZA VAC SPLIT QUAD 0.5 ML IM SUSY
0.5000 mL | PREFILLED_SYRINGE | INTRAMUSCULAR | Status: AC
  Administered 2017-03-19: 0.5 mL via INTRAMUSCULAR

## 2017-03-18 NOTE — Anesthesia Postprocedure Evaluation (Signed)
Anesthesia Post Note  Patient: Sports administrator  Procedure(s) Performed: HYSTERECTOMY ABDOMINAL (N/A Abdomen) SALPINGO OOPHORECTOMY (Bilateral Abdomen) CYSTOSCOPY (N/A Bladder)     Patient location during evaluation: PACU Anesthesia Type: General Level of consciousness: awake and alert Pain management: pain level controlled Vital Signs Assessment: post-procedure vital signs reviewed and stable Respiratory status: spontaneous breathing, nonlabored ventilation and respiratory function stable Cardiovascular status: blood pressure returned to baseline and stable Postop Assessment: no apparent nausea or vomiting Anesthetic complications: no    Last Vitals:  Vitals:   03/18/17 0417 03/18/17 0800  BP: 133/77 135/72  Pulse: 60 66  Resp: 17 16  Temp: 36.9 C 37.2 C  SpO2: 97% 97%    Last Pain:  Vitals:   03/18/17 0800  TempSrc: Oral  PainSc:                  Catalina Gravel

## 2017-03-18 NOTE — Progress Notes (Signed)
1 Day Post-Op Procedure(s) (LRB): HYSTERECTOMY ABDOMINAL (N/A) SALPINGO OOPHORECTOMY (Bilateral) CYSTOSCOPY (N/A)  Subjective: Patient reports tolerating PO, + flatus and no problems voiding.    Objective: I have reviewed patient's vital signs, intake and output, medications and labs.  General: alert Resp: clear to auscultation bilaterally Cardio: regular rate and rhythm, S1, S2 normal, no murmur, click, rub or gallop GI: soft, non-tender; bowel sounds normal; no masses,  no organomegaly Extremities: extremities normal, atraumatic, no cyanosis or edema Honeycomb dressing clean, dry, intact  Assessment: s/p Procedure(s): HYSTERECTOMY ABDOMINAL (N/A) SALPINGO OOPHORECTOMY (Bilateral) CYSTOSCOPY (N/A): progressing well  Plan: Discharge home tomorrow  LOS: 1 day    [place] 03/18/2017, 12:19 PM

## 2017-03-18 NOTE — Anesthesia Postprocedure Evaluation (Signed)
Anesthesia Post Note  Patient: Sports administrator  Procedure(s) Performed: HYSTERECTOMY ABDOMINAL (N/A Abdomen) SALPINGO OOPHORECTOMY (Bilateral Abdomen) CYSTOSCOPY (N/A Bladder)     Patient location during evaluation: [facility] Anesthesia Type: General Level of consciousness: awake Pain management: pain level controlled Vital Signs Assessment: post-procedure vital signs reviewed and stable Respiratory status: spontaneous breathing Cardiovascular status: stable Postop Assessment: adequate PO intake and no apparent nausea or vomiting Anesthetic complications: no    Last Vitals:  Vitals:   03/18/17 0417 03/18/17 0800  BP: 133/77 135/72  Pulse: 60 66  Resp: 17 16  Temp: 36.9 C 37.2 C  SpO2: 97% 97%    Last Pain:  Vitals:   03/18/17 0800  TempSrc: Oral  PainSc:    Pain Goal: Patients Stated Pain Goal: 4 (03/17/17 1848)               Everette Rank

## 2017-03-18 NOTE — Discharge Instructions (Signed)
Abdominal Hysterectomy, Care After °This sheet gives you information about how to care for yourself after your procedure. Your doctor may also give you more specific instructions. If you have problems or questions, contact your doctor. °Follow these instructions at home: °Bathing °· Do not take baths, swim, or use a hot tub until your doctor says it is okay. Ask your doctor if you can take showers. You may only be allowed to take sponge baths for bathing. °· Keep the bandage (dressing) dry until your doctor says it can be taken off. °Surgical cut ( °incision) care °· Follow instructions from your doctor about how to take care of your cut from surgery. Make sure you: °? Wash your hands with soap and water before you change your bandage (dressing). If you cannot use soap and water, use hand sanitizer. °? Change your bandage as told by your doctor. °? Leave stitches (sutures), skin glue, or skin tape (adhesive) strips in place. They may need to stay in place for 2 weeks or longer. If tape strips get loose and curl up, you may trim the loose edges. Do not remove tape strips completely unless your doctor says it is okay. °· Check your surgical cut area every day for signs of infection. Check for: °? Redness, swelling, or pain. °? Fluid or blood. °? Warmth. °? Pus or a bad smell. °Activity °· Do gentle, daily exercise as told by your doctor. You may be told to take short walks every day and go farther each time. °· Do not lift anything that is heavier than 10 lb (4.5 kg), or the limit that your doctor tells you, until he or she says that it is safe. °· Do not drive or use heavy machinery while taking prescription pain medicine. °· Do not drive for 24 hours if you were given a medicine to help you relax (sedative). °· Follow your doctor's advice about exercise, driving, and general activities. Ask your doctor what activities are safe for you. °Lifestyle °· Do not douche, use tampons, or have sex for at least 6 weeks or as  told by your doctor. °· Do not drink alcohol until your doctor says it is okay. °· Drink enough fluid to keep your pee (urine) clear or pale yellow. °· Try to have someone at home with you for the first 1-2 weeks to help. °· Do not use any products that contain nicotine or tobacco, such as cigarettes and e-cigarettes. These can slow down healing. If you need help quitting, ask your doctor. °General instructions °· Take over-the-counter and prescription medicines only as told by your doctor. °· Do not take aspirin or ibuprofen. These medicines can cause bleeding. °· To prevent or treat constipation while you are taking prescription pain medicine, your doctor may suggest that you: °? Drink enough fluid to keep your urine clear or pale yellow. °? Take over-the-counter or prescription medicines. °? Eat foods that are high in fiber, such as: °§ Fresh fruits and vegetables. °§ Whole grains. °§ Beans. °? Limit foods that are high in fat and processed sugars, such as fried and sweet foods. °· Keep all follow-up visits as told by your doctor. This is important. °Contact a doctor if: °· You have chills or fever. °· You have redness, swelling, or pain around your cut. °· You have fluid or blood coming from your cut. °· Your cut feels warm to the touch. °· You have pus or a bad smell coming from your cut. °· Your cut breaks   open. °· You feel dizzy or light-headed. °· You have pain or bleeding when you pee. °· You keep having watery poop (diarrhea). °· You keep feeling sick to your stomach (nauseous) or keep throwing up (vomiting). °· You have unusual fluid (discharge) coming from your vagina. °· You have a rash. °· You have a reaction to your medicine. °· Your pain medicine does not help. °Get help right away if: °· You have a fever and your symptoms get worse all of a sudden. °· You have very bad belly (abdominal) pain. °· You are short of breath. °· You pass out (faint). °· You have pain, swelling, or redness of your  leg. °· You bleed a lot from your vagina and notice clumps of blood (clots). °Summary °· Do not take baths, swim, or use a hot tub until your doctor says it is okay. Ask your doctor if you can take showers. You may only be allowed to take sponge baths for bathing. °· Follow your doctor's advice about exercise, driving, and general activities. Ask your doctor what activities are safe for you. °· Do not lift anything that is heavier than 10 lb (4.5 kg), or the limit that your doctor tells you, until he or she says that it is safe. °· Try to have someone at home with you for the first 1-2 weeks to help. °This information is not intended to replace advice given to you by your health care provider. Make sure you discuss any questions you have with your health care provider. °Document Released: 10/30/2007 Document Revised: 01/09/2016 Document Reviewed: 01/09/2016 °Elsevier Interactive Patient Education © 2017 [employer] ° °

## 2017-03-18 NOTE — Addendum Note (Signed)
Addendum  created 03/18/17 0847 by Georgeanne Nim, CRNA   Sign clinical note

## 2017-03-19 NOTE — Discharge Summary (Signed)
Physician Discharge Summary  Patient ID: Nina Hopkins MRN: [id number] DOB/AGE: 05/02/1960 57 y.o.  Admit date: 03/17/2017 Discharge date: 03/19/2017  Admission Diagnoses: symptomatic fibroids  Discharge Diagnoses: same Active Problems:   Post-operative state   Discharged Condition: good  Hospital Course: She underwent an uncomplicated TAH/BSO. By POD #1 she was voiding, tolerating po well, having flatus, and ambulating. By POD #2 she voiced her readiness to go home.  Consults: None  Significant Diagnostic Studies: labs: postop hbg 11.1  Treatments: surgery: TAH/BSO  Discharge Exam: Blood pressure 135/76, pulse 61, temperature 98.2 F (36.8 C), temperature source Oral, resp. rate 16, height 5' 0.98" (1.549 m), weight 86.6 kg (191 lb), last menstrual period 03/02/2017, SpO2 96 %. General appearance: alert Resp: clear to auscultation bilaterally Cardio: regular rate and rhythm, S1, S2 normal, no murmur, click, rub or gallop GI: soft, non-tender; bowel sounds normal; no masses,  no organomegaly Incision/Wound: Honeycomb dressing clean/dry/intact  Disposition: 01-Home or Self Care    Follow-up Information    Emily Filbert, MD. Schedule an appointment as soon as possible for a visit in 6 week(s).   Specialty:  Obstetrics and Gynecology Contact information: [place] [address] [phone]           Signed: Emily Filbert 03/19/2017, 7:43 AM

## 2017-03-24 ENCOUNTER — Telehealth: Payer: Self-pay | Admitting: General Practice

## 2017-03-24 NOTE — Telephone Encounter (Signed)
Patient called and left message on nurse line stating she had surgery 7 days ago and still hasn't had a bowel movement. Called patient stating I am trying to her to return her phone call. Discussed with patient taking a dose of Miralax every 5-6 hours until she has a bowel movement (per Dr Hulan Fray). Also recommended continuing stool softeners and decreasing use of percocet. Patient verbalized understanding to all & had no questions

## 2017-03-27 ENCOUNTER — Inpatient Hospital Stay (HOSPITAL_BASED_OUTPATIENT_CLINIC_OR_DEPARTMENT_OTHER): Payer: Self-pay

## 2017-03-27 ENCOUNTER — Inpatient Hospital Stay (HOSPITAL_COMMUNITY)
Admission: AD | Admit: 2017-03-27 | Discharge: 2017-03-27 | Disposition: A | Discharge status: Home / Self Care | Payer: Self-pay | Source: Ambulatory Visit | Attending: Obstetrics & Gynecology | Admitting: Obstetrics & Gynecology

## 2017-03-27 ENCOUNTER — Encounter: Payer: Self-pay | Admitting: Obstetrics & Gynecology

## 2017-03-27 ENCOUNTER — Encounter (HOSPITAL_COMMUNITY): Payer: Self-pay

## 2017-03-27 ENCOUNTER — Ambulatory Visit (INDEPENDENT_AMBULATORY_CARE_PROVIDER_SITE_OTHER): Payer: Self-pay | Admitting: Obstetrics & Gynecology

## 2017-03-27 VITALS — BP 129/83 | HR 84 | Temp 99.3°F | Wt 187.7 lb

## 2017-03-27 DIAGNOSIS — G8918 Other acute postprocedural pain: Secondary | ICD-10-CM

## 2017-03-27 DIAGNOSIS — M79661 Pain in right lower leg: Secondary | ICD-10-CM

## 2017-03-27 DIAGNOSIS — R609 Edema, unspecified: Secondary | ICD-10-CM

## 2017-03-27 DIAGNOSIS — Z9889 Other specified postprocedural states: Secondary | ICD-10-CM | POA: Insufficient documentation

## 2017-03-27 LAB — CBC
HCT: 37.2 % (ref 36.0–46.0)
Hemoglobin: 12.3 g/dL (ref 12.0–15.0)
MCH: 29.2 pg (ref 26.0–34.0)
MCHC: 33.1 g/dL (ref 30.0–36.0)
MCV: 88.4 fL (ref 78.0–100.0)
PLATELETS: 297 10*3/uL (ref 150–400)
RBC: 4.21 MIL/uL (ref 3.87–5.11)
RDW: 12.6 % (ref 11.5–15.5)
WBC: 6.9 10*3/uL (ref 4.0–10.5)

## 2017-03-27 MED ORDER — IBUPROFEN 600 MG PO TABS: 600.0000 mg | ORAL_TABLET | Freq: Four times a day (QID) | ORAL | 1 refills | Status: DC | PRN

## 2017-03-27 NOTE — Progress Notes (Signed)
   Subjective:    Patient ID: Nina Hopkins, female    DOB: 1960/06/14, 57 y.o.   MRN: [id number]  HPI This lovely 57 yo lady is here 10 days after a TAH/BSO for a very large fibroid uterus with the following concerns. She was doing very well until about 48 hours ago when she just didn't feel well. She also noted a pain with walking in her right calf. She is voiding, tolerating po without nausea or vomiting. She has not had sex since surgery. She is having BMs since taking Miralax. She still has several percocets left and would like a refill on her IBU.    Review of Systems Pathology negative     Objective:   Physical Exam Temp is 99.3 Breathing, conversing, and ambulating normally Well nourished, well hydrated White female, no apparent distress Abd- benign Incision- almost healed completely Right calf with pain but negative Homan's sign      Assessment & Plan:  Right calf  Pain- She will be taken to the MAU for evaluation of this pain (rule out DVT) I would also rec a CBC. There is no evidence of a pelvic infection/wound infection.

## 2017-03-27 NOTE — MAU Note (Addendum)
Post op hysterectomy 10 days ago having pain in right calf. Onset night before last.

## 2017-03-27 NOTE — MAU Note (Signed)
Ordered regular diet

## 2017-03-27 NOTE — Discharge Instructions (Signed)
Musculoskeletal Pain Musculoskeletal pain is muscle and bone aches and pains. This pain can occur in any part of the body. Follow these instructions at home:  Only take medicines for pain, discomfort, or fever as told by your health care provider.  You may continue all activities unless the activities cause more pain. When the pain lessens, slowly resume normal activities. Gradually increase the intensity and duration of the activities or exercise.  During periods of severe pain, bed rest may be helpful. Lie or sit in any position that is comfortable, but get out of bed and walk around at least every several hours.  If directed, put ice on the injured area. ? Put ice in a plastic bag. ? Place a towel between your skin and the bag. ? Leave the ice on for 20 minutes, 2-3 times a day. Contact a health care provider if:  Your pain is getting worse.  Your pain is not relieved with medicines.  You lose function in the area of the pain if the pain is in your arms, legs, or neck. This information is not intended to replace advice given to you by your health care provider. Make sure you discuss any questions you have with your health care provider. Document Released: 01/20/2005 Document Revised: 07/03/2015 Document Reviewed: 09/24/2012 Elsevier Interactive Patient Education  2017 [place] therapy can help ease sore, stiff, injured, and tight muscles and joints. Heat relaxes your muscles, which may help ease your pain. Heat therapy should only be used on old, pre-existing, or long-lasting (chronic) injuries. Do not use heat therapy unless told by your doctor. How to use heat therapy There are several different kinds of heat therapy, including:  Moist heat pack.  Warm water bath.  Hot water bottle.  Electric heating pad.  Heated gel pack.  Heated wrap.  Electric heating pad.  General heat therapy recommendations  Do not sleep while using heat therapy.  Only use heat therapy while you are awake.  Your skin may turn pink while using heat therapy. Do not use heat therapy if your skin turns red.  Do not use heat therapy if you have new pain.  High heat or long exposure to heat can cause burns. Be careful when using heat therapy to avoid burning your skin.  Do not use heat therapy on areas of your skin that are already irritated, such as with a rash or sunburn. Get help if:  You have blisters, redness, swelling (puffiness), or numbness.  You have new pain.  Your pain is worse. This information is not intended to replace advice given to you by your health care provider. Make sure you discuss any questions you have with your health care provider. Document Released: 04/14/2011 Document Revised: 06/28/2015 Document Reviewed: 03/15/2013 Elsevier Interactive Patient Education  [employer].

## 2017-03-27 NOTE — Progress Notes (Signed)
RLE venous duplex prelim: negative for DVT.  Hava Massingale Eunice, RDMS, RVT  
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
## 2017-04-07 ENCOUNTER — Ambulatory Visit: Payer: Self-pay | Attending: Internal Medicine | Admitting: *Deleted

## 2017-04-07 VITALS — BP 147/95 | HR 79

## 2017-04-07 DIAGNOSIS — Z1211 Encounter for screening for malignant neoplasm of colon: Secondary | ICD-10-CM

## 2017-04-07 DIAGNOSIS — I10 Essential (primary) hypertension: Secondary | ICD-10-CM

## 2017-04-07 DIAGNOSIS — Z0131 Encounter for examination of blood pressure with abnormal findings: Secondary | ICD-10-CM | POA: Insufficient documentation

## 2017-04-07 NOTE — Progress Notes (Signed)
Pt arrived to [facility], alert and oriented and arrives in good spirits. Last OV  with 03/02/2017.  Pt denies chest pain, SOB, HA, dizziness, or blurred vision.  Verified medication. Pt states medication was taken this morning.  Manual blood pressure reading: 147/95    Blood pressure is noted to be borderline elevated today.

## 2017-04-23 ENCOUNTER — Other Ambulatory Visit (HOSPITAL_COMMUNITY): Payer: Self-pay | Admitting: Obstetrics & Gynecology

## 2017-04-23 DIAGNOSIS — Z1231 Encounter for screening mammogram for malignant neoplasm of breast: Secondary | ICD-10-CM

## 2017-04-30 ENCOUNTER — Encounter: Payer: Self-pay | Admitting: General Practice

## 2017-04-30 ENCOUNTER — Encounter: Payer: Self-pay | Admitting: Obstetrics & Gynecology

## 2017-04-30 ENCOUNTER — Ambulatory Visit (INDEPENDENT_AMBULATORY_CARE_PROVIDER_SITE_OTHER): Payer: Self-pay | Admitting: Obstetrics & Gynecology

## 2017-04-30 VITALS — BP 159/87 | HR 75 | Wt 192.3 lb

## 2017-04-30 DIAGNOSIS — Z9889 Other specified postprocedural states: Secondary | ICD-10-CM

## 2017-04-30 NOTE — Progress Notes (Signed)
   Subjective:    Patient ID: Nina Hopkins, female    DOB: 1960-06-22, 57 y.o.   MRN: [id number]  HPI  57 yo lady here for a post op visit. She had a TAH/BSO for symptomatic fibroids. She is doing very well, no complaints. 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 She would like another 2 weeks out due to some soreness in the middle of her incision. She reports normal bowel and bladder function.  Review of Systems     Objective:   Physical Exam Breathing, conversing, and ambulating normally Well nourished, well hydrated Black female, no apparent distress Abd- benign Incision- healed well Cuff- healed well Bimanual- no masses or tenderness      Assessment & Plan:  Post op stable Rec out of work for another 2 weeks

## 2017-05-01 ENCOUNTER — Encounter: Payer: Self-pay | Admitting: Nurse Practitioner

## 2017-05-01 ENCOUNTER — Ambulatory Visit: Payer: Self-pay | Attending: Nurse Practitioner | Admitting: Nurse Practitioner

## 2017-05-01 VITALS — BP 163/95 | HR 73 | Temp 98.5°F | Resp 16 | Ht 61.0 in | Wt 190.8 lb

## 2017-05-01 DIAGNOSIS — Z9851 Tubal ligation status: Secondary | ICD-10-CM | POA: Insufficient documentation

## 2017-05-01 DIAGNOSIS — E782 Mixed hyperlipidemia: Secondary | ICD-10-CM | POA: Insufficient documentation

## 2017-05-01 DIAGNOSIS — Z801 Family history of malignant neoplasm of trachea, bronchus and lung: Secondary | ICD-10-CM | POA: Insufficient documentation

## 2017-05-01 DIAGNOSIS — Z79899 Other long term (current) drug therapy: Secondary | ICD-10-CM | POA: Insufficient documentation

## 2017-05-01 DIAGNOSIS — Z833 Family history of diabetes mellitus: Secondary | ICD-10-CM | POA: Insufficient documentation

## 2017-05-01 DIAGNOSIS — Z90710 Acquired absence of both cervix and uterus: Secondary | ICD-10-CM | POA: Insufficient documentation

## 2017-05-01 DIAGNOSIS — Z800 Family history of malignant neoplasm of digestive organs: Secondary | ICD-10-CM | POA: Insufficient documentation

## 2017-05-01 DIAGNOSIS — G8929 Other chronic pain: Secondary | ICD-10-CM | POA: Insufficient documentation

## 2017-05-01 DIAGNOSIS — Z9889 Other specified postprocedural states: Secondary | ICD-10-CM | POA: Insufficient documentation

## 2017-05-01 DIAGNOSIS — Z808 Family history of malignant neoplasm of other organs or systems: Secondary | ICD-10-CM | POA: Insufficient documentation

## 2017-05-01 DIAGNOSIS — I10 Essential (primary) hypertension: Secondary | ICD-10-CM | POA: Insufficient documentation

## 2017-05-01 DIAGNOSIS — M25511 Pain in right shoulder: Secondary | ICD-10-CM | POA: Insufficient documentation

## 2017-05-01 DIAGNOSIS — E785 Hyperlipidemia, unspecified: Secondary | ICD-10-CM | POA: Insufficient documentation

## 2017-05-01 DIAGNOSIS — M546 Pain in thoracic spine: Secondary | ICD-10-CM | POA: Insufficient documentation

## 2017-05-01 DIAGNOSIS — Z888 Allergy status to other drugs, medicaments and biological substances status: Secondary | ICD-10-CM | POA: Insufficient documentation

## 2017-05-01 DIAGNOSIS — M79602 Pain in left arm: Secondary | ICD-10-CM | POA: Insufficient documentation

## 2017-05-01 DIAGNOSIS — Z791 Long term (current) use of non-steroidal anti-inflammatories (NSAID): Secondary | ICD-10-CM | POA: Insufficient documentation

## 2017-05-01 DIAGNOSIS — D259 Leiomyoma of uterus, unspecified: Secondary | ICD-10-CM | POA: Insufficient documentation

## 2017-05-01 MED ORDER — LOSARTAN POTASSIUM-HCTZ 100-25 MG PO TABS: 1.0000 | ORAL_TABLET | Freq: Every day | ORAL | 3 refills | Status: DC

## 2017-05-01 MED FILL — LOSARTAN-HCTZ 100-25 MG TAB: 100-25 | 30 days supply | Qty: 30 | Fill #0

## 2017-05-01 NOTE — Progress Notes (Signed)
Assessment & Plan:  Nina Hopkins was seen today for re-establish and shoulder pain.  Diagnoses and all orders for this visit:  Essential hypertension -     losartan-hydrochlorothiazide (HYZAAR) 100-25 MG tablet; Take 1 tablet by mouth daily. Continue all antihypertensives as prescribed.  Remember to bring in your blood pressure log with you for your follow up appointment.  DASH/Mediterranean Diets are healthier choices for HTN.   Mixed hyperlipidemia Omega 3 capsules x2 per day Lipid panel at next office visit Work on a low fat, heart healthy diet and participate in regular aerobic exercise program to control as well by working out at least 150 minutes per week. No fried foods. No junk foods, sodas, sugary drinks, unhealthy snacking, or smoking.   Chronic right-sided thoracic back pain -     DG THORACOLUMABAR SPINE; Future Work on losing weight to help reduce back pain. May alternate with heat and ice application for pain relief. May also alternate with acetaminophen and Ibuprofen as prescribed for back pain. Other alternatives include massage, acupuncture and water aerobics.  You must stay active and avoid a sedentary lifestyle.     Patient has been counseled on age-appropriate routine health concerns for screening and prevention. These are reviewed and up-to-date. Referrals have been placed accordingly. Immunizations are up-to-date or declined.    Subjective:   Chief Complaint  Patient presents with  . re-establish  . Shoulder Pain   HPI Nina Hopkins 57 y.o. female presents to office today to establish care. She has a history of AUB and is s/p Total abdominal hysterectomy with oopherectomy.  Today she has complaints of let shoulder pain and chronic right thoracic back pain.  She states the reason for her hysterectomy was also to make sure her fibroids were not causing her back pain however since her surgery was performed she continues to have intermittent thoracic back pain.     Left Arm Pain Acute. Onset 3 weeks ago. Pain is intermittent and located in the medial upper arm below the shoulder. It appears to be more musculoskeletal in nature. Duration: Minutes. Relieving factors: none; goes away on its own. At this time pain is tolerable. Will just monitor for now.   Chronic Back Pain Onset 2 years. Location: Right sided thoracic back pain. Pain is intermittent. Aggravating factors are bending and twisting. Denies dysuria, hematuria, abdominal pain, sciatica or radiation.  No involuntary loss of bladder or bowel. Pain lasts a few minutes to all day. Relieving factors: Ibuprofen. Will order imaging as pain has been chronic in nature.   Essential Hypertension Chronic. Not well controlled on losartan 50mg  and HCTZ 25mg  despite medication compliance. Will switch to hyzaar. She does eat a lot of frozen food. I instructed her regarding the sodium content in frozen foods. She was also taking a moderate amount of ibuprofen in the past but reports she has cut back since her hysterectomy and only uses sparingly. Denies cardiacchest pain, shortness of breath, palpitations, lightheadedness, dizziness, headaches or BLE edema. She does endorse reproducible left sided chest pain.  BP Readings from Last 3 Encounters:  05/01/17 (!) 163/95  04/30/17 (!) 159/87  04/07/17 (!) 147/95   Hyperlipidemia Stopped taking pravastatin due to cost. She can not afford it. I have instructed her to take Omega 3 two capsules per day at this time. Will recheck lipids at next office visit. Risk factors are moderate for CVD complications due to HTN, age, Weight and mixed hyperlipidemia. She is aware that she may have to restart  pravastatin based on her next lipid panel after taking omega 3 for the next few months.  Lab Results  Component Value Date   LDLCALC 118 (H) 03/02/2017    Review of Systems  Constitutional: Negative for fever, malaise/fatigue and weight loss.  HENT: Negative.  Negative for  nosebleeds.   Eyes: Negative.  Negative for blurred vision, double vision and photophobia.  Respiratory: Negative.  Negative for cough and shortness of breath.   Cardiovascular: Positive for chest pain (musculoskeletal). Negative for palpitations and leg swelling.  Gastrointestinal: Negative.  Negative for heartburn, nausea and vomiting.  Musculoskeletal: Positive for back pain and myalgias. Negative for falls.       SEE HPI  Neurological: Negative.  Negative for dizziness, focal weakness, seizures and headaches.  Psychiatric/Behavioral: Negative.  Negative for suicidal ideas.    Past Medical History:  Diagnosis Date  . Anemia   . Anxiety ~ 2008   no meds  . Dyspnea    hx - r/t anemia per patient  . Family history of adverse reaction to anesthesia    "my sister had a hard time waking up"  . History of blood transfusion 12/20/2014   related to anemia  . Hyperlipidemia   . Hypertension   . Missed ab    x 1 - no surgery required  . SVD (spontaneous vaginal delivery)    x 2    Past Surgical History:  Procedure Laterality Date  . ABDOMINAL HYSTERECTOMY N/A 03/17/2017   Procedure: HYSTERECTOMY ABDOMINAL;  Surgeon: Emily Filbert, MD;  Location: [facility];  Service: Gynecology;  Laterality: N/A;  . CERVIX LESION DESTRUCTION     cryro  . CYSTOSCOPY N/A 03/17/2017   Procedure: CYSTOSCOPY;  Surgeon: Emily Filbert, MD;  Location: [facility];  Service: Gynecology;  Laterality: N/A;  . DILATION AND CURETTAGE, DIAGNOSTIC / THERAPEUTIC      in office  . SALPINGOOPHORECTOMY Bilateral 03/17/2017   Procedure: SALPINGO OOPHORECTOMY;  Surgeon: Emily Filbert, MD;  Location: [facility];  Service: Gynecology;  Laterality: Bilateral;  . TUBAL LIGATION  1990's    Family History  Problem Relation Age of Onset  . Cancer Mother        BRAIN TUMOR, VULVAR  . Hypertension Mother   . Diabetes Mother   . Cancer Father        PENILE  . Cancer Maternal Grandmother        LUNG  . Cancer Maternal Grandfather         COLON    Social History Reviewed with no changes to be made today.   Outpatient Medications Prior to Visit  Medication Sig Dispense Refill  . ibuprofen (ADVIL,MOTRIN) 600 MG tablet Take 1 tablet (600 mg total) by mouth every 8 (eight) hours as needed for mild pain. (Patient not taking: Reported on 04/30/2017) 30 tablet 0  . polyethylene glycol (MIRALAX / GLYCOLAX) packet Take 17 g by mouth daily.    . potassium chloride SA (K-DUR,KLOR-CON) 20 MEQ tablet Take 1 tablet (20 mEq total) by mouth 2 (two) times daily. 60 tablet 0  . pravastatin (PRAVACHOL) 20 MG tablet Take 1 tablet (20 mg total) by mouth daily. (Patient not taking: Reported on 04/30/2017) 30 tablet 2  . Vitamin D, Ergocalciferol, (DRISDOL) 50000 units CAPS capsule Take 1 capsule (50,000 Units total) by mouth every 7 (seven) days. (Patient not taking: Reported on 04/30/2017) 16 capsule 0  . hydrochlorothiazide (HYDRODIURIL) 25 MG tablet Take 1 tablet (25 mg total) daily by mouth.  90 tablet 3  . losartan (COZAAR) 50 MG tablet Take 1 tablet (50 mg total) by mouth daily. 30 tablet 2   No facility-administered medications prior to visit.     Allergies  Allergen Reactions  . Prilosec [Omeprazole]     Stiff neck       Objective:    BP (!) 163/95   Pulse 73   Temp 98.5 F (36.9 C) (Oral)   Resp 16   Ht 5\' 1"  (1.549 m)   Wt 190 lb 12.8 oz (86.5 kg)   LMP 03/02/2017 (Approximate)   SpO2 100%   BMI 36.05 kg/m  Wt Readings from Last 3 Encounters:  05/01/17 190 lb 12.8 oz (86.5 kg)  04/30/17 192 lb 4.8 oz (87.2 kg)  03/27/17 187 lb (84.8 kg)    Physical Exam  Constitutional: She is oriented to person, place, and time. She appears well-developed and well-nourished. She is cooperative.  HENT:  Head: Normocephalic and atraumatic.  Eyes: EOM are normal.  Neck: Normal range of motion.  Cardiovascular: Normal rate, regular rhythm and normal heart sounds. Exam reveals no gallop and no friction rub.  No murmur  heard. Pulmonary/Chest: Effort normal and breath sounds normal. No tachypnea. No respiratory distress. She has no decreased breath sounds. She has no wheezes. She has no rhonchi. She has no rales. She exhibits no tenderness.  Abdominal: Bowel sounds are normal.  Musculoskeletal: She exhibits no edema.       Back:  Neurological: She is alert and oriented to person, place, and time. Coordination normal.  Skin: Skin is warm and dry.  Psychiatric: She has a normal mood and affect. Her behavior is normal. Judgment and thought content normal.  Nursing note and vitals reviewed.      Patient has been counseled extensively about nutrition and exercise as well as the importance of adherence with medications and regular follow-up. The patient was given clear instructions to go to ER or return to medical center if symptoms don't improve, worsen or new problems develop. The patient verbalized understanding.   Follow-up: Return in about 1 month (around 05/29/2017) for F/U HTN.   Gildardo Pounds, FNP-BC [facility] [place], [place]   05/01/2017, 1:57 PM

## 2017-05-01 NOTE — Patient Instructions (Addendum)
Glucosamine with Chondroitin    Thoracic Strain Thoracic strain is an injury to the muscles or tendons that attach to the upper back. A strain can be mild or severe. A mild strain may take only 1-2 weeks to heal. A severe strain involves torn muscles or tendons, so it may take 6-8 weeks to heal. Follow these instructions at home:  Rest as needed. Limit your activity as told by your doctor.  If directed, put ice on the injured area: ? Put ice in a plastic bag. ? Place a towel between your skin and the bag. ? Leave the ice on for 20 minutes, 2-3 times per day.  Take over-the-counter and prescription medicines only as told by your doctor.  Begin doing exercises as told by your doctor or physical therapist.  Warm up before being active.  Bend your knees before you lift heavy objects.  Keep all follow-up visits as told by your doctor. This is important. Contact a doctor if:  Your pain is not helped by medicine.  Your pain, bruising, or swelling is getting worse.  You have a fever. Get help right away if:  You have shortness of breath.  You have chest pain.  You have weakness or loss of feeling (numbness) in your legs.  You cannot control when you pee (urinate). This information is not intended to replace advice given to you by your health care provider. Make sure you discuss any questions you have with your health care provider. Document Released: 07/09/2007 Document Revised: 09/22/2015 Document Reviewed: 03/16/2014 Elsevier Interactive Patient Education  [employer].

## 2017-05-01 NOTE — Progress Notes (Signed)
Pt states she has normal chest pain   Pt states she have some issues with her right shoulder, left side and lower back  Pt states she got the hysterectomy because they thought that some of her symptoms were coming from her fibroids but after she got the surgery pt states she thought the pain would go away but it didn't

## 2017-05-06 ENCOUNTER — Other Ambulatory Visit: Payer: Self-pay | Admitting: Nurse Practitioner

## 2017-05-06 ENCOUNTER — Ambulatory Visit (HOSPITAL_COMMUNITY)
Admission: RE | Admit: 2017-05-06 | Discharge: 2017-05-06 | Disposition: A | Discharge status: Home / Self Care | Payer: Self-pay | Source: Ambulatory Visit | Attending: Nurse Practitioner | Admitting: Nurse Practitioner

## 2017-05-06 DIAGNOSIS — M546 Pain in thoracic spine: Principal | ICD-10-CM

## 2017-05-06 DIAGNOSIS — G8929 Other chronic pain: Secondary | ICD-10-CM

## 2017-05-06 DIAGNOSIS — M47896 Other spondylosis, lumbar region: Secondary | ICD-10-CM | POA: Insufficient documentation

## 2017-05-06 DIAGNOSIS — M4316 Spondylolisthesis, lumbar region: Secondary | ICD-10-CM | POA: Insufficient documentation

## 2017-05-11 ENCOUNTER — Other Ambulatory Visit: Payer: Self-pay | Admitting: Nurse Practitioner

## 2017-05-11 ENCOUNTER — Telehealth: Payer: Self-pay | Admitting: General Practice

## 2017-05-11 ENCOUNTER — Telehealth: Payer: Self-pay

## 2017-05-11 MED ORDER — AMLODIPINE BESYLATE 5 MG PO TABS: 10.0000 mg | ORAL_TABLET | Freq: Every day | ORAL | 1 refills | Status: DC

## 2017-05-11 MED ORDER — LOSARTAN POTASSIUM 50 MG PO TABS: 50.0000 mg | ORAL_TABLET | Freq: Every day | ORAL | 3 refills | Status: DC

## 2017-05-11 MED ORDER — HYDROCHLOROTHIAZIDE 25 MG PO TABS: 25.0000 mg | ORAL_TABLET | Freq: Every day | ORAL | 0 refills | Status: DC

## 2017-05-11 MED ORDER — AMLODIPINE BESYLATE 10 MG PO TABS: 10.0000 mg | ORAL_TABLET | Freq: Every day | ORAL | 0 refills | Status: DC

## 2017-05-11 NOTE — Telephone Encounter (Signed)
Patient called and left message on nurse line stating she got a return to work letter but she needs one that says without limitations. Called patient back and informed her we can give her an updated letter, she just needs to come by anytime during office hours. Patient verbalized understanding & had no questions

## 2017-05-11 NOTE — Telephone Encounter (Signed)
She can take HCTZ 25mg  and a new blood pressure medication (amlodipine) has been sent along with her losartan 50mg . She will now take 3 BP medications. Thank you

## 2017-05-11 NOTE — Telephone Encounter (Signed)
Pt states begins by stating she is not sure if its the medication or anemia.  Feels like her iron count is low. She was told to d/c iron since having her hysterectomy for fibroids February 12,2019. An OV was scheduled  for  Thursday, April 11 @ 1010.  She is requesting recommendations what to do about taking her blood pressure medication because Hyzaar makes her feel bad. She asks if she could take old prescription:  HCTZ 25mg .

## 2017-05-12 MED FILL — AMLODIPINE BESYLATE 10 MG T: 10 | 30 days supply | Qty: 30 | Fill #0

## 2017-05-12 NOTE — Telephone Encounter (Signed)
She reported that she did not want to take the Hyzaar. At this time until her office visit she should take HCTZ 25mg , Amlodipine 5mg  and Losartan 50mg . Thanks!

## 2017-05-12 NOTE — Telephone Encounter (Signed)
Pt wanted to know if she should take all 3 medications since her initial concerns was that her blood pressure had dropped. She wasn't able to check her blood pressure at the time because her battery had died. She  Has been off medication x 3 days and has since taken blood pressure at home with reading as followed.  110/74 and 139/89. She states she will take HCTZ 25 mg but please advise on Losartan and Amlodipine.  Explained to the patient the Hyzaar was combination drug of HCTZ and Losartan 100 mg.

## 2017-05-13 NOTE — Telephone Encounter (Addendum)
Unable to deliver message x 2 attempts.  Voicemail has not been set up.

## 2017-05-14 ENCOUNTER — Ambulatory Visit: Payer: Self-pay | Attending: Nurse Practitioner | Admitting: Physician Assistant

## 2017-05-14 ENCOUNTER — Ambulatory Visit
Admission: RE | Admit: 2017-05-14 | Discharge: 2017-05-14 | Disposition: A | Discharge status: Home / Self Care | Payer: No Typology Code available for payment source | Source: Ambulatory Visit | Attending: Obstetrics & Gynecology | Admitting: Obstetrics & Gynecology

## 2017-05-14 ENCOUNTER — Encounter: Payer: Self-pay | Admitting: Obstetrics & Gynecology

## 2017-05-14 ENCOUNTER — Other Ambulatory Visit: Payer: Self-pay

## 2017-05-14 VITALS — BP 149/84 | HR 78 | Temp 98.2°F | Resp 18 | Ht 61.0 in | Wt 192.0 lb

## 2017-05-14 DIAGNOSIS — Z79899 Other long term (current) drug therapy: Secondary | ICD-10-CM | POA: Insufficient documentation

## 2017-05-14 DIAGNOSIS — Z1231 Encounter for screening mammogram for malignant neoplasm of breast: Secondary | ICD-10-CM

## 2017-05-14 DIAGNOSIS — E785 Hyperlipidemia, unspecified: Secondary | ICD-10-CM | POA: Insufficient documentation

## 2017-05-14 DIAGNOSIS — I10 Essential (primary) hypertension: Secondary | ICD-10-CM | POA: Insufficient documentation

## 2017-05-14 DIAGNOSIS — F419 Anxiety disorder, unspecified: Secondary | ICD-10-CM | POA: Insufficient documentation

## 2017-05-14 DIAGNOSIS — D500 Iron deficiency anemia secondary to blood loss (chronic): Secondary | ICD-10-CM | POA: Insufficient documentation

## 2017-05-14 DIAGNOSIS — R42 Dizziness and giddiness: Secondary | ICD-10-CM | POA: Insufficient documentation

## 2017-05-14 DIAGNOSIS — E876 Hypokalemia: Secondary | ICD-10-CM | POA: Insufficient documentation

## 2017-05-14 MED ORDER — LOSARTAN POTASSIUM 50 MG PO TABS: 50.0000 mg | ORAL_TABLET | Freq: Every day | ORAL | 3 refills | Status: DC

## 2017-05-14 MED ORDER — AMLODIPINE BESYLATE 10 MG PO TABS: 10.0000 mg | ORAL_TABLET | ORAL | 5 refills | Status: DC

## 2017-05-14 MED ORDER — HYDROCHLOROTHIAZIDE 25 MG PO TABS: 25.0000 mg | ORAL_TABLET | Freq: Every day | ORAL | 0 refills | Status: DC

## 2017-05-14 NOTE — Progress Notes (Signed)
Nina Hopkins, is a 57 y.o. female  AYT:[id number]  [id number]:[id number]  DOB - Mar 17, 1960  Subjective:  Chief Complaint and HPI: Nina Hopkins is a 57 y.o. female here today for an episode of light headedness and dizziness 2 days  after being started on a new dose of BP meds(losartan 100 and HCTZ 25mg ).  She denies any CP or SOB at the time.  She had felt fine all day that day but was walking down her hallway that night and felt off balnce and had to reach for the wall to steady herself.  She was monitoring her BP intermittently and it was going up and down from 122/88 tp 152/117 then her BP cuff battery died.  She stopped taking the new medication and went back to 25mg  HCTZ only.  2 months ago K+ was low.  She never got potassium prescription filled.    ROS:   Constitutional:  No f/c, No night sweats, No unexplained weight loss. EENT:  No vision changes, No blurry vision, No hearing changes. No mouth, throat, or ear problems.  Respiratory: No cough, No SOB Cardiac: No CP, no palpitations GI:  No abd pain, No N/V/D. GU: No Urinary s/sx Musculoskeletal: No joint pain Neuro: No headache, no dizziness, no motor weakness.  Skin: No rash Endocrine:  No polydipsia. No polyuria.  Psych: Denies SI/HI  Problem  Htn (Hypertension)  Abnormal Uterine Bleeding (Aub) (Resolved)    ALLERGIES: Allergies  Allergen Reactions  . Prilosec [Omeprazole]     Stiff neck    PAST MEDICAL HISTORY: Past Medical History:  Diagnosis Date  . Anemia   . Anxiety ~ 2008   no meds  . Dyspnea    hx - r/t anemia per patient  . Family history of adverse reaction to anesthesia    "my sister had a hard time waking up"  . History of blood transfusion 12/20/2014   related to anemia  . Hyperlipidemia   . Hypertension   . Missed ab    x 1 - no surgery required  . SVD (spontaneous vaginal delivery)    x 2    MEDICATIONS AT HOME: Prior to Admission medications   kkkkkkkkkkkkkoMedication Sig Start Date  End Date Taking? Authorizing Provider  amLODipine (NORVASC) 5 MG tablet Take 2 tablets (10 mg total) by mouth daily. 05/11/17  Yes Gildardo Pounds, NP  hydrochlorothiazide (HYDRODIURIL) 25 MG tablet Take 1 tablet (25 mg total) by mouth daily. 05/14/17  Yes Freeman Caldron M, PA-C  losartan (COZAAR) 50 MG tablet Take 1 tablet (50 mg total) by mouth daily. 05/14/17  Yes Sadiyah Kangas, Levada Dy M, PA-C  polyethylene glycol (MIRALAX / GLYCOLAX) packet Take 17 g by mouth daily.   Yes [provider]  amLODipine (NORVASC) 10 MG tablet Take 1 tablet by mouth 1 day or 1 dose. 05/12/17   [provider]  ibuprofen (ADVIL,MOTRIN) 600 MG tablet Take 1 tablet (600 mg total) by mouth every 8 (eight) hours as needed for mild pain. Patient not taking: Reported on 04/30/2017 02/16/17   Alfonse Spruce, FNP  potassium chloride SA (K-DUR,KLOR-CON) 20 MEQ tablet Take 1 tablet (20 mEq total) by mouth 2 (two) times daily. 03/10/17 04/09/17  Emily Filbert, MD  pravastatin (PRAVACHOL) 20 MG tablet Take 1 tablet (20 mg total) by mouth daily. Patient not taking: Reported on 04/30/2017 03/05/17   Alfonse Spruce, FNP  Vitamin D, Ergocalciferol, (DRISDOL) 50000 units CAPS capsule Take 1 capsule (50,000 Units total) by mouth every  7 (seven) days. Patient not taking: Reported on 04/30/2017 01/16/17   Argentina Donovan, PA-C     Objective:  EXAM:   Vitals:   05/14/17 1024  BP: (!) 149/84  Pulse: 78  Resp: 18  Temp: 98.2 F (36.8 C)  TempSrc: Oral  SpO2: 100%  Weight: 192 lb (87.1 kg)  Height: 5\' 1"  (1.549 m)    General appearance : A&OX3. NAD. Non-toxic-appearing HEENT: Atraumatic and Normocephalic.  PERRLA. EOM intact.  Neck: supple, no JVD. No cervical lymphadenopathy. No thyromegaly Chest/Lungs:  Breathing-non-labored, Good air entry bilaterally, breath sounds normal without rales, rhonchi, or wheezing  CVS: S1 S2 regular, no murmurs, gallops, rubs  Extremities: Bilateral Lower Ext shows no edema,  both legs are warm to touch with = pulse throughout Neurology:  CN II-XII grossly intact, Non focal.   Psych:  TP linear. J/I WNL. Normal speech. Appropriate eye contact and affect.  Skin:  No Rash  Data Review No results found for: HGBA1C   Assessment & Plan   1. Light headedness Likely was due to BP med change.  She does not want to take hyzaar.  Stay off hyzaar.   Add amlodipine and take 1/2 tab daily for 1 week then increase to 1 tablet daily.  Continue HCTZ 25mg .  EKG shows NSR and no acute or ST changes.  Stable since 02/2017.  Also, she has seen cardiology and has had a stress test in the recent past that was all unremarkable.   - EKG 12-Lead - CBC with Differential/Platelet - Basic metabolic panel  2. Hypertension, unspecified type Uncontrolled-new plan HCTZ 25mg  and amlodipine titrate to 10mg .  Check BP daily and record - CBC with Differential/Platelet  3. Iron deficiency anemia due to chronic blood loss Check Hgb s/p hysterectomy.  Not taking Iron  4. Hypokalemia Didn't take Rx potassium-will send new Rx to our pharmacy if potassium is low.   - Basic metabolic panel  Patient have been counseled extensively about nutrition and exercise  Return for keep 5/7 appt with Zelda.  The patient was given clear instructions to go to ER or return to medical center if symptoms don't improve, worsen or new problems develop. The patient verbalized understanding. The patient was told to call to get lab results if they haven't heard anything in the next week.     Freeman Caldron, PA-C [facility] [place], [place]   05/14/2017, 10:41 AM

## 2017-05-14 NOTE — Patient Instructions (Signed)
Take 1/2 tablet of amlodipine for 1 week then increase dose to 1 daily.  Check your blood pressure once daily and record and bring to your next visit.     Hypertension Hypertension is another name for high blood pressure. High blood pressure forces your heart to work harder to pump blood. This can cause problems over time. There are two numbers in a blood pressure reading. There is a top number (systolic) over a bottom number (diastolic). It is best to have a blood pressure below 120/80. Healthy choices can help lower your blood pressure. You may need medicine to help lower your blood pressure if:  Your blood pressure cannot be lowered with healthy choices.  Your blood pressure is higher than 130/80.  Follow these instructions at home: Eating and drinking  If directed, follow the DASH eating plan. This diet includes: ? Filling half of your plate at each meal with fruits and vegetables. ? Filling one quarter of your plate at each meal with whole grains. Whole grains include whole wheat pasta, brown rice, and whole grain bread. ? Eating or drinking low-fat dairy products, such as skim milk or low-fat yogurt. ? Filling one quarter of your plate at each meal with low-fat (lean) proteins. Low-fat proteins include fish, skinless chicken, eggs, beans, and tofu. ? Avoiding fatty meat, cured and processed meat, or chicken with skin. ? Avoiding premade or processed food.  Eat less than 1,500 mg of salt (sodium) a day.  Limit alcohol use to no more than 1 drink a day for nonpregnant women and 2 drinks a day for men. One drink equals 12 oz of beer, 5 oz of wine, or 1 oz of hard liquor. Lifestyle  Work with your doctor to stay at a healthy weight or to lose weight. Ask your doctor what the best weight is for you.  Get at least 30 minutes of exercise that causes your heart to beat faster (aerobic exercise) most days of the week. This may include walking, swimming, or biking.  Get at least 30  minutes of exercise that strengthens your muscles (resistance exercise) at least 3 days a week. This may include lifting weights or pilates.  Do not use any products that contain nicotine or tobacco. This includes cigarettes and e-cigarettes. If you need help quitting, ask your doctor.  Check your blood pressure at home as told by your doctor.  Keep all follow-up visits as told by your doctor. This is important. Medicines  Take over-the-counter and prescription medicines only as told by your doctor. Follow directions carefully.  Do not skip doses of blood pressure medicine. The medicine does not work as well if you skip doses. Skipping doses also puts you at risk for problems.  Ask your doctor about side effects or reactions to medicines that you should watch for. Contact a doctor if:  You think you are having a reaction to the medicine you are taking.  You have headaches that keep coming back (recurring).  You feel dizzy.  You have swelling in your ankles.  You have trouble with your vision. Get help right away if:  You get a very bad headache.  You start to feel confused.  You feel weak or numb.  You feel faint.  You get very bad pain in your: ? Chest. ? Belly (abdomen).  You throw up (vomit) more than once.  You have trouble breathing. Summary  Hypertension is another name for high blood pressure.  Making healthy choices can help lower  blood pressure. If your blood pressure cannot be controlled with healthy choices, you may need to take medicine. This information is not intended to replace advice given to you by your health care provider. Make sure you discuss any questions you have with your health care provider. Document Released: 07/09/2007 Document Revised: 12/19/2015 Document Reviewed: 12/19/2015 Elsevier Interactive Patient Education  2018 [employer].     Hypokalemia Hypokalemia means that the amount of potassium in the blood is lower than  normal.Potassium is a chemical that helps regulate the amount of fluid in the body (electrolyte). It also stimulates muscle tightening (contraction) and helps nerves work properly.Normally, most of the body's potassium is inside of cells, and only a very small amount is in the blood. Because the amount in the blood is so small, minor changes to potassium levels in the blood can be life-threatening. What are the causes? This condition may be caused by:  Antibiotic medicine.  Diarrhea or vomiting. Taking too much of a medicine that helps you have a bowel movement (laxative) can cause diarrhea and lead to hypokalemia.  Chronic kidney disease (CKD).  Medicines that help the body get rid of excess fluid (diuretics).  Eating disorders, such as bulimia.  Low magnesium levels in the body.  Sweating a lot.  What are the signs or symptoms? Symptoms of this condition include:  Weakness.  Constipation.  Fatigue.  Muscle cramps.  Mental confusion.  Skipped heartbeats or irregular heartbeat (palpitations).  Tingling or numbness.  How is this diagnosed? This condition is diagnosed with a blood test. How is this treated? Hypokalemia can be treated by taking potassium supplements by mouth or adjusting the medicines that you take. Treatment may also include eating more foods that contain a lot of potassium. If your potassium level is very low, you may need to get potassium through an IV tube in one of your veins and be monitored in the hospital. Follow these instructions at home:  Take over-the-counter and prescription medicines only as told by your health care provider. This includes vitamins and supplements.  Eat a healthy diet. A healthy diet includes fresh fruits and vegetables, whole grains, healthy fats, and lean proteins.  If instructed, eat more foods that contain a lot of potassium, such as: ? Nuts, such as peanuts and pistachios. ? Seeds, such as sunflower seeds and pumpkin  seeds. ? Peas, lentils, and lima beans. ? Whole grain and bran cereals and breads. ? Fresh fruits and vegetables, such as apricots, avocado, bananas, cantaloupe, kiwi, oranges, tomatoes, asparagus, and potatoes. ? Orange juice. ? Tomato juice. ? Red meats. ? Yogurt.  Keep all follow-up visits as told by your health care provider. This is important. Contact a health care provider if:  You have weakness that gets worse.  You feel your heart pounding or racing.  You vomit.  You have diarrhea.  You have diabetes (diabetes mellitus) and you have trouble keeping your blood sugar (glucose) in your target range. Get help right away if:  You have chest pain.  You have shortness of breath.  You have vomiting or diarrhea that lasts for more than 2 days.  You faint. This information is not intended to replace advice given to you by your health care provider. Make sure you discuss any questions you have with your health care provider. Document Released: 01/20/2005 Document Revised: 09/08/2015 Document Reviewed: 09/08/2015 Elsevier Interactive Patient Education  2018 [employer].

## 2017-05-14 NOTE — Telephone Encounter (Signed)
Pt concerns addressed at [place] today.

## 2017-05-15 LAB — CBC WITH DIFFERENTIAL/PLATELET
Basophils Absolute: 0 10*3/uL (ref 0.0–0.2)
Basos: 1 %
EOS (ABSOLUTE): 0.1 10*3/uL (ref 0.0–0.4)
Eos: 2 %
Hematocrit: 36.5 % (ref 34.0–46.6)
Hemoglobin: 12.2 g/dL (ref 11.1–15.9)
IMMATURE GRANULOCYTES: 0 %
Immature Grans (Abs): 0 10*3/uL (ref 0.0–0.1)
Lymphocytes Absolute: 1.3 10*3/uL (ref 0.7–3.1)
Lymphs: 26 %
MCH: 29.3 pg (ref 26.6–33.0)
MCHC: 33.4 g/dL (ref 31.5–35.7)
MCV: 88 fL (ref 79–97)
MONOCYTES: 5 %
MONOS ABS: 0.3 10*3/uL (ref 0.1–0.9)
NEUTROS PCT: 66 %
Neutrophils Absolute: 3.3 10*3/uL (ref 1.4–7.0)
Platelets: 281 10*3/uL (ref 150–379)
RBC: 4.17 x10E6/uL (ref 3.77–5.28)
RDW: 14.4 % (ref 12.3–15.4)
WBC: 5 10*3/uL (ref 3.4–10.8)

## 2017-05-15 LAB — BASIC METABOLIC PANEL
BUN/Creatinine Ratio: 11 (ref 9–23)
BUN: 9 mg/dL (ref 6–24)
CALCIUM: 9.4 mg/dL (ref 8.7–10.2)
CHLORIDE: 102 mmol/L (ref 96–106)
CO2: 26 mmol/L (ref 20–29)
Creatinine, Ser: 0.84 mg/dL (ref 0.57–1.00)
GFR calc Af Amer: 90 mL/min/{1.73_m2} (ref >59)
GFR, EST NON AFRICAN AMERICAN: 78 mL/min/{1.73_m2} (ref >59)
GLUCOSE: 80 mg/dL (ref 65–99)
POTASSIUM: 3.8 mmol/L (ref 3.5–5.2)
Sodium: 144 mmol/L (ref 134–144)

## 2017-05-17 ENCOUNTER — Other Ambulatory Visit: Payer: Self-pay | Admitting: Nurse Practitioner

## 2017-05-17 DIAGNOSIS — M4310 Spondylolisthesis, site unspecified: Secondary | ICD-10-CM

## 2017-05-18 ENCOUNTER — Telehealth: Payer: Self-pay | Admitting: *Deleted

## 2017-05-18 NOTE — Telephone Encounter (Signed)
-----   Message from Argentina Donovan, [place] sent at 05/17/2017  7:56 AM EDT ----- Your blood count, blood sugar, electrolytes, and kidney function are all normal.  Follow-up as planned. Thanks, Freeman Caldron, PA-C

## 2017-05-18 NOTE — Telephone Encounter (Signed)
Patient verified DOB Patient is aware of blood work being normal an to follow up as planned. No further questions.

## 2017-05-20 ENCOUNTER — Telehealth: Payer: Self-pay | Admitting: *Deleted

## 2017-05-20 MED FILL — VIT D2 1.25 MG (50,000 UNIT: 1.25 MG | 28 days supply | Qty: 4 | Fill #0

## 2017-05-20 MED FILL — ?PRAVASTATIN SODIUM 20MG TA: 20 | 30 days supply | Qty: 30 | Fill #0

## 2017-05-20 NOTE — Telephone Encounter (Signed)
"  I received two missed calls from this number.  If someone would return the call."  Call originated from [phone] per [facility] call identification option 9.

## 2017-05-25 ENCOUNTER — Other Ambulatory Visit: Payer: Self-pay

## 2017-05-25 ENCOUNTER — Ambulatory Visit: Payer: No Typology Code available for payment source | Attending: Nurse Practitioner

## 2017-05-25 ENCOUNTER — Other Ambulatory Visit (HOSPITAL_COMMUNITY): Payer: Self-pay | Admitting: *Deleted

## 2017-05-25 DIAGNOSIS — R928 Other abnormal and inconclusive findings on diagnostic imaging of breast: Secondary | ICD-10-CM

## 2017-05-25 DIAGNOSIS — R293 Abnormal posture: Secondary | ICD-10-CM

## 2017-05-25 DIAGNOSIS — M62830 Muscle spasm of back: Secondary | ICD-10-CM | POA: Insufficient documentation

## 2017-05-25 DIAGNOSIS — M6281 Muscle weakness (generalized): Secondary | ICD-10-CM

## 2017-05-25 NOTE — Patient Instructions (Signed)
Issued KNee to chest same and opposite shoulders. 30 sec 2-3 reps 2x/day and PPT  5-10 sec 10-20 reps .

## 2017-05-25 NOTE — Therapy (Signed)
[place], [place], [address] Phone: [phone]   Fax:  [phone]  Physical Therapy Evaluation  Patient Details  Name: Nina Hopkins MRN: [id number] Date of Birth: 07-Dec-1960 Referring Provider: [name] NP   Encounter Date: 05/25/2017  PT End of Session - 05/25/17 1101    Visit Number  1    Number of Visits  12    Date for PT Re-Evaluation  07/03/17    Authorization Type  CFA    PT Start Time  1015    PT Stop Time  1100    PT Time Calculation (min)  45 min    Activity Tolerance  Patient tolerated treatment well    Behavior During Therapy  [facility] for tasks assessed/performed       Past Medical History:  Diagnosis Date  . Anemia   . Anxiety ~ 2008   no meds  . Dyspnea    hx - r/t anemia per patient  . Family history of adverse reaction to anesthesia    "my sister had a hard time waking up"  . History of blood transfusion 12/20/2014   related to anemia  . Hyperlipidemia   . Hypertension   . Missed ab    x 1 - no surgery required  . SVD (spontaneous vaginal delivery)    x 2    Past Surgical History:  Procedure Laterality Date  . ABDOMINAL HYSTERECTOMY N/A 03/17/2017   Procedure: HYSTERECTOMY ABDOMINAL;  Surgeon: Emily Filbert, MD;  Location: [facility];  Service: Gynecology;  Laterality: N/A;  . CERVIX LESION DESTRUCTION     cryro  . CYSTOSCOPY N/A 03/17/2017   Procedure: CYSTOSCOPY;  Surgeon: Emily Filbert, MD;  Location: [facility];  Service: Gynecology;  Laterality: N/A;  . DILATION AND CURETTAGE, DIAGNOSTIC / THERAPEUTIC      in office  . SALPINGOOPHORECTOMY Bilateral 03/17/2017   Procedure: SALPINGO OOPHORECTOMY;  Surgeon: Emily Filbert, MD;  Location: [facility];  Service: Gynecology;  Laterality: Bilateral;  . TUBAL LIGATION  1990's    There were no vitals filed for this visit.   Subjective Assessment - 05/25/17 1022    Subjective  She reports back pain on RT side  in flank . Now  RT and LT side .   Some days feels popping.    Pain onset 2 + years ago.       Limitations  Walking;Standing Lying on side without support    How long can you sit comfortably?  As needed    How long can you stand comfortably?  varies with pain    How long can you walk comfortably?  varies  with pain    Diagnostic tests  x ray :  arthritic changes.     Currently in Pain?  No/denies    Pain Score  9  in past week     Pain Location  Back    Pain Orientation  Right;Left;Lower    Pain Descriptors / Indicators  Aching pull     Pain Type  Chronic pain    Pain Onset  More than a month ago    Pain Frequency  Intermittent    Aggravating Factors   when in pain standing  and walking    Pain Relieving Factors  sitting.  meds         [facility] PT Assessment - 05/25/17 0001      Assessment   Medical Diagnosis  anteriolisthesis  Referring Provider  [name] NP    Next MD Visit  Jun 09, 2017      Precautions   Precautions  None      Restrictions   Weight Bearing Restrictions  No      Balance Screen   Has the patient fallen in the past 6 months  No    Has the patient had a decrease in activity level because of a fear of falling?   No    Is the patient reluctant to leave their home because of a fear of falling?   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.,       Cognition   Overall Cognitive Status  Within Functional Limits for tasks assessed      Posture/Postural Control   Posture Comments  increased thoracic kyphosis and lumar lordosis, head tilted to LT       ROM / Strength   AROM / PROM / Strength  AROM;Strength;PROM      AROM   AROM Assessment Site  Lumbar    Lumbar Flexion  70    Lumbar Extension  20    Lumbar - Right Side Bend  12    Lumbar - Left Side Bend  12      PROM   Overall PROM  Comments  Hip ROM WFL with end range pain in supine Prone Hip ER and IR decr   with negative Tomas test      Strength   Overall Strength Comments  LE strength normal      Flexibility   Soft Tissue Assessment /Muscle Length  yes    Hamstrings  SLR 80 degrees bilaterally      Palpation   Palpation comment  Today no real tenderness to touch      Ambulation/Gait   Gait Comments  WNL                Objective measurements completed on examination: See above findings.              PT Education - 05/25/17 1045    Education provided  Yes    Education Details  POC , HEP    Person(s) Educated  Patient    Methods  Explanation;Tactile cues;Verbal cues    Comprehension  Verbalized understanding       PT Short Term Goals - 05/25/17 1055      PT SHORT TERM GOAL #1   Title  She will be independent with iniital HEp     Time  2    Period  Weeks    Status  New      PT SHORT TERM GOAL #2   Title  Demo understanding  good posture for anteriolisthesis    Time  2    Period  Weeks    Status  New        PT Long Term Goals - 05/25/17 1056      PT LONG TERM GOAL #1   Title  She was independent with all HEP issued.     Time  6    Period  Weeks    Status  New      PT LONG TERM GOAL #2   Title  She will report pain decr in frequency by 50%  Time  6    Period  Weeks    Status  New      PT LONG TERM GOAL #3   Title  She will report intensity decr 50% when flared up    Time  6    Period  Weeks    Status  New             Plan - 05/25/17 1102    Clinical Impression Statement  Ms Neeb presents with complaint of intermittant back pain more in flank RT/LT  .   Extension was painful today and this would corelate to standing and walking pain and Dx of anteriolisthisis.  she also has core weakness ionad postural changes in lower and upper back.     Clinical Presentation  Stable    Clinical Decision Making  Low    Rehab Potential  Good    PT Frequency  2x  / week    PT Duration  6 weeks    PT Treatment/Interventions  Moist Heat;Therapeutic exercise;Manual techniques;Passive range of motion;Patient/family education    PT Next Visit Plan  progres HEP,  modalities if needed    PT Home Exercise Plan  PPT , Knee to chest and adduction    Consulted and Agree with Plan of Care  Patient       Patient will benefit from skilled therapeutic intervention in order to improve the following deficits and impairments:  Increased muscle spasms, Pain, Postural dysfunction, Decreased strength  Visit Diagnosis: Abnormal posture  Muscle spasm of back  Muscle weakness (generalized)     Problem List Patient Active Problem List   Diagnosis Date Noted  . HTN (hypertension) 05/14/2017  . Post-operative state 03/17/2017  . Fibroids 01/09/2017  . Back pain, thoracic 12/21/2014  . Anemia 12/20/2014  . Dyspnea 12/20/2014  . Heavy menses 12/20/2014    [name]  PT 05/25/2017, 11:32 AM  [facility] [address] [place], [place], [address] Phone: [phone]   Fax:  [phone]  Name: Zaylia Riolo MRN: [id number] Date of Birth: 10/12/1960

## 2017-05-29 ENCOUNTER — Ambulatory Visit: Payer: No Typology Code available for payment source

## 2017-06-03 ENCOUNTER — Ambulatory Visit: Payer: Self-pay

## 2017-06-04 ENCOUNTER — Other Ambulatory Visit: Payer: No Typology Code available for payment source

## 2017-06-04 ENCOUNTER — Ambulatory Visit (HOSPITAL_COMMUNITY): Payer: No Typology Code available for payment source

## 2017-06-05 ENCOUNTER — Encounter: Payer: Self-pay | Admitting: Nurse Practitioner

## 2017-06-05 ENCOUNTER — Other Ambulatory Visit: Payer: Self-pay

## 2017-06-05 ENCOUNTER — Ambulatory Visit: Payer: Self-pay | Attending: Nurse Practitioner | Admitting: Nurse Practitioner

## 2017-06-05 VITALS — BP 130/89 | HR 76 | Temp 98.8°F | Resp 16 | Ht 61.0 in | Wt 187.8 lb

## 2017-06-05 DIAGNOSIS — Z833 Family history of diabetes mellitus: Secondary | ICD-10-CM | POA: Insufficient documentation

## 2017-06-05 DIAGNOSIS — E559 Vitamin D deficiency, unspecified: Secondary | ICD-10-CM | POA: Insufficient documentation

## 2017-06-05 DIAGNOSIS — M069 Rheumatoid arthritis, unspecified: Secondary | ICD-10-CM | POA: Insufficient documentation

## 2017-06-05 DIAGNOSIS — E785 Hyperlipidemia, unspecified: Secondary | ICD-10-CM | POA: Insufficient documentation

## 2017-06-05 DIAGNOSIS — Z90722 Acquired absence of ovaries, bilateral: Secondary | ICD-10-CM | POA: Insufficient documentation

## 2017-06-05 DIAGNOSIS — Z1211 Encounter for screening for malignant neoplasm of colon: Secondary | ICD-10-CM | POA: Insufficient documentation

## 2017-06-05 DIAGNOSIS — Z8249 Family history of ischemic heart disease and other diseases of the circulatory system: Secondary | ICD-10-CM | POA: Insufficient documentation

## 2017-06-05 DIAGNOSIS — R52 Pain, unspecified: Secondary | ICD-10-CM | POA: Insufficient documentation

## 2017-06-05 DIAGNOSIS — Z888 Allergy status to other drugs, medicaments and biological substances status: Secondary | ICD-10-CM | POA: Insufficient documentation

## 2017-06-05 DIAGNOSIS — D6861 Antiphospholipid syndrome: Secondary | ICD-10-CM | POA: Insufficient documentation

## 2017-06-05 DIAGNOSIS — Z79899 Other long term (current) drug therapy: Secondary | ICD-10-CM | POA: Insufficient documentation

## 2017-06-05 DIAGNOSIS — Z8049 Family history of malignant neoplasm of other genital organs: Secondary | ICD-10-CM | POA: Insufficient documentation

## 2017-06-05 DIAGNOSIS — I10 Essential (primary) hypertension: Secondary | ICD-10-CM | POA: Insufficient documentation

## 2017-06-05 DIAGNOSIS — M329 Systemic lupus erythematosus, unspecified: Secondary | ICD-10-CM | POA: Insufficient documentation

## 2017-06-05 DIAGNOSIS — Z90710 Acquired absence of both cervix and uterus: Secondary | ICD-10-CM | POA: Insufficient documentation

## 2017-06-05 NOTE — Progress Notes (Signed)
Assessment & Plan:  Nina Hopkins was seen today for follow-up.  Diagnoses and all orders for this visit:  Generalized body aches -     CYCLIC CITRUL PEPTIDE ANTIBODY, IGG/IGA -     Rheumatoid factor -     ANA -     Antiphospholipid Syndrome Comp -     C-reactive protein -     C3 and C4  Vitamin D deficiency -     VITAMIN D 25 Hydroxy (Vit-D Deficiency, Fractures)  Colon cancer screening -     Fecal occult blood, imunochemical([facility])    Patient has been counseled on age-appropriate routine health concerns for screening and prevention. These are reviewed and up-to-date. Referrals have been placed accordingly. Immunizations are up-to-date or declined.    Subjective:   Chief Complaint  Patient presents with  . Follow-up   HPI Nina Hopkins 57 y.o. female presents to office today for follow up. She has concerns of pain and aching in her bilateral lower extremities. Increasing over the past 3 weeks however she reports initial symptoms began over the past year. She denies any swelling of joints or hand upper body pain. She has been taking ibuprofen 600mg  which provides some relief of pain. Reports a history of RA and lupus in her mother.   Essential Hypertension Chronic. Stable. Well controlled today. She endorses medication compliance. Taking amlodipine 10mg  dialy, HCTZ 25mg  daily.  Denies chest pain, shortness of breath, palpitations, lightheadedness, dizziness, headaches or BLE edema.  BP Readings from Last 3 Encounters:  06/05/17 130/89  05/14/17 (!) 149/84  05/01/17 (!) 163/95   Hyperlipidemia She has stopped taking pravastatin. Last lipid panel was not fasting. She would like to work on diet and exercise (will take OTC omega 3) to help reduce her lipids prior to restarting on a statin. Will recheck lipids at next office visit.   Review of Systems  Constitutional: Negative for fever, malaise/fatigue and weight loss.  HENT: Negative.  Negative for  nosebleeds.   Eyes: Negative.  Negative for blurred vision, double vision and photophobia.  Respiratory: Negative.  Negative for cough and shortness of breath.   Cardiovascular: Negative.  Negative for chest pain, palpitations and leg swelling.  Gastrointestinal: Negative.  Negative for heartburn, nausea and vomiting.  Musculoskeletal: Positive for myalgias.       SEE HPI  Neurological: Negative.  Negative for dizziness, focal weakness, seizures and headaches.  Psychiatric/Behavioral: Negative.  Negative for suicidal ideas.    Past Medical History:  Diagnosis Date  . Anemia   . Anxiety ~ 2008   no meds  . Dyspnea    hx - r/t anemia per patient  . Family history of adverse reaction to anesthesia    "my sister had a hard time waking up"  . History of blood transfusion 12/20/2014   related to anemia  . Hyperlipidemia   . Hypertension   . Missed ab    x 1 - no surgery required  . SVD (spontaneous vaginal delivery)    x 2    Past Surgical History:  Procedure Laterality Date  . ABDOMINAL HYSTERECTOMY N/A 03/17/2017   Procedure: HYSTERECTOMY ABDOMINAL;  Surgeon: Emily Filbert, MD;  Location: [facility];  Service: Gynecology;  Laterality: N/A;  . CERVIX LESION DESTRUCTION     cryro  . CYSTOSCOPY N/A 03/17/2017   Procedure: CYSTOSCOPY;  Surgeon: Emily Filbert, MD;  Location: [facility];  Service: Gynecology;  Laterality: N/A;  . DILATION AND CURETTAGE, DIAGNOSTIC / THERAPEUTIC  in office  . SALPINGOOPHORECTOMY Bilateral 03/17/2017   Procedure: SALPINGO OOPHORECTOMY;  Surgeon: Emily Filbert, MD;  Location: [facility];  Service: Gynecology;  Laterality: Bilateral;  . TUBAL LIGATION  1990's    Family History  Problem Relation Age of Onset  . Cancer Mother        BRAIN TUMOR, VULVAR  . Hypertension Mother   . Diabetes Mother   . Cancer Father        PENILE  . Cancer Maternal Grandmother        LUNG  . Cancer Maternal Grandfather        COLON    Social History Reviewed with no changes  to be made today.   Outpatient Medications Prior to Visit  Medication Sig Dispense Refill  . amLODipine (NORVASC) 10 MG tablet Take 1 tablet (10 mg total) by mouth 1 day or 1 dose. 30 tablet 5  . hydrochlorothiazide (HYDRODIURIL) 25 MG tablet Take 1 tablet (25 mg total) by mouth daily. 30 tablet 0  . ibuprofen (ADVIL,MOTRIN) 600 MG tablet Take 1 tablet (600 mg total) by mouth every 8 (eight) hours as needed for mild pain. 30 tablet 0  . pravastatin (PRAVACHOL) 20 MG tablet Take 1 tablet (20 mg total) by mouth daily. 30 tablet 2  . Vitamin D, Ergocalciferol, (DRISDOL) 50000 units CAPS capsule Take 1 capsule (50,000 Units total) by mouth every 7 (seven) days. 16 capsule 0  . polyethylene glycol (MIRALAX / GLYCOLAX) packet Take 17 g by mouth daily.    . potassium chloride SA (K-DUR,KLOR-CON) 20 MEQ tablet Take 1 tablet (20 mEq total) by mouth 2 (two) times daily. 60 tablet 0   No facility-administered medications prior to visit.     Allergies  Allergen Reactions  . Prilosec [Omeprazole]     Stiff neck       Objective:    BP 130/89 (BP Location: Left Arm, Patient Position: Sitting, Cuff Size: Normal)   Pulse 76   Temp 98.8 F (37.1 C)   Resp 16   Ht 5\' 1"  (1.549 m)   Wt 187 lb 12.8 oz (85.2 kg)   LMP 03/02/2017 (Approximate)   SpO2 96%   BMI 35.48 kg/m  Wt Readings from Last 3 Encounters:  06/05/17 187 lb 12.8 oz (85.2 kg)  05/14/17 192 lb (87.1 kg)  05/01/17 190 lb 12.8 oz (86.5 kg)    Physical Exam  Constitutional: She is oriented to person, place, and time. She appears well-developed and well-nourished. She is cooperative.  HENT:  Head: Normocephalic and atraumatic.  Eyes: EOM are normal.  Neck: Normal range of motion.  Cardiovascular: Normal rate, regular rhythm and normal heart sounds. Exam reveals no gallop and no friction rub.  No murmur heard. Pulmonary/Chest: Effort normal and breath sounds normal. No tachypnea. No respiratory distress. She has no decreased  breath sounds. She has no wheezes. She has no rhonchi. She has no rales. She exhibits no tenderness.  Abdominal: Bowel sounds are normal.  Musculoskeletal: Normal range of motion. She exhibits no edema, tenderness or deformity.  Neurological: She is alert and oriented to person, place, and time. Coordination normal.  Skin: Skin is warm and dry.  Psychiatric: She has a normal mood and affect. Her behavior is normal. Judgment and thought content normal.  Nursing note and vitals reviewed.      Patient has been counseled extensively about nutrition and exercise as well as the importance of adherence with medications and regular follow-up. The patient was given  clear instructions to go to ER or return to medical center if symptoms don't improve, worsen or new problems develop. The patient verbalized understanding.   Follow-up: Return in about 3 months (around 09/05/2017).   Gildardo Pounds, FNP-BC [facility] [place], [place]   06/06/2017, 12:18 AM

## 2017-06-05 NOTE — Patient Instructions (Signed)
Sent Referral to [facility] ph. # [phone] [address] [place], [place] [address]

## 2017-06-05 NOTE — Progress Notes (Signed)
Follow  Up appt: having a lot of pain and stiffness "breast to feet" Intermittent pain x 4 weeks  8/10 on bad days  RF on Vitamin D and  amlodipine

## 2017-06-09 ENCOUNTER — Ambulatory Visit: Payer: Self-pay

## 2017-06-09 ENCOUNTER — Ambulatory Visit: Payer: Self-pay | Admitting: Nurse Practitioner

## 2017-06-11 LAB — C-REACTIVE PROTEIN: CRP: 1.3 mg/L (ref 0.0–4.9)

## 2017-06-11 LAB — CYCLIC CITRUL PEPTIDE ANTIBODY, IGG/IGA: Cyclic Citrullin Peptide Ab: 3 units (ref 0–19)

## 2017-06-11 LAB — ANTIPHOSPHOLIPID SYNDROME COMP
ANTICARDIOLIPIN AB, IGM: 12 [MPL'U]
APTT: 25.6 s
Anticardiolipin Ab, IgA: <10 [APL'U]
Anticardiolipin Ab, IgG: <10 [GPL'U]
Antiphosphatidylserine IgG: 6 {GPS'U}
Antiphosphatidylserine IgM: 4 {MPS'U}
Antiprothrombin Antibody, IgG: 5 G units
Beta-2 Glycoprotein I, IgG: <10 SGU
Beta-2 Glycoprotein I, IgM: <10 SMU
DRVVT SCREEN SECONDS: 29.9 s
Hexagonal Phospholipid Neutral: 3 s
PLATELET NEUTRALIZATION: 0 s

## 2017-06-11 LAB — ANA: ANA: NEGATIVE

## 2017-06-11 LAB — RHEUMATOID FACTOR: Rhuematoid fact SerPl-aCnc: <10 IU/mL (ref 0.0–13.9)

## 2017-06-11 LAB — C3 AND C4
COMPLEMENT C4, SERUM: 27 mg/dL (ref 14–44)
Complement C3, Serum: 137 mg/dL (ref 82–167)

## 2017-06-11 LAB — VITAMIN D 25 HYDROXY (VIT D DEFICIENCY, FRACTURES): VIT D 25 HYDROXY: 31.7 ng/mL (ref 30.0–100.0)

## 2017-06-12 ENCOUNTER — Ambulatory Visit: Payer: Self-pay | Admitting: Physical Therapy

## 2017-06-15 ENCOUNTER — Encounter: Payer: Self-pay | Admitting: Physical Therapy

## 2017-06-15 ENCOUNTER — Ambulatory Visit: Payer: Self-pay | Attending: Nurse Practitioner | Admitting: Physical Therapy

## 2017-06-15 DIAGNOSIS — M6281 Muscle weakness (generalized): Secondary | ICD-10-CM | POA: Insufficient documentation

## 2017-06-15 DIAGNOSIS — R293 Abnormal posture: Secondary | ICD-10-CM | POA: Insufficient documentation

## 2017-06-15 DIAGNOSIS — M62830 Muscle spasm of back: Secondary | ICD-10-CM | POA: Insufficient documentation

## 2017-06-15 MED FILL — AMLODIPINE BESYLATE 10 MG T: 10 | 30 days supply | Qty: 30 | Fill #0

## 2017-06-15 NOTE — Therapy (Signed)
[place], [place], [address] Phone: [phone]   Fax:  [phone]  Physical Therapy Treatment  Patient Details  Name: Nina Hopkins MRN: [id number] Date of Birth: March 02, 1960 Referring Provider: [name] NP   Encounter Date: 06/15/2017  PT End of Session - 06/15/17 0940    Visit Number  2    Number of Visits  12    Date for PT Re-Evaluation  07/03/17    Authorization Type  CFA    PT Start Time  0930    PT Stop Time  1010    PT Time Calculation (min)  40 min       Past Medical History:  Diagnosis Date  . Anemia   . Anxiety ~ 2008   no meds  . Dyspnea    hx - r/t anemia per patient  . Family history of adverse reaction to anesthesia    "my sister had a hard time waking up"  . History of blood transfusion 12/20/2014   related to anemia  . Hyperlipidemia   . Hypertension   . Missed ab    x 1 - no surgery required  . SVD (spontaneous vaginal delivery)    x 2    Past Surgical History:  Procedure Laterality Date  . ABDOMINAL HYSTERECTOMY N/A 03/17/2017   Procedure: HYSTERECTOMY ABDOMINAL;  Surgeon: Emily Filbert, MD;  Location: [facility];  Service: Gynecology;  Laterality: N/A;  . CERVIX LESION DESTRUCTION     cryro  . CYSTOSCOPY N/A 03/17/2017   Procedure: CYSTOSCOPY;  Surgeon: Emily Filbert, MD;  Location: [facility];  Service: Gynecology;  Laterality: N/A;  . DILATION AND CURETTAGE, DIAGNOSTIC / THERAPEUTIC      in office  . SALPINGOOPHORECTOMY Bilateral 03/17/2017   Procedure: SALPINGO OOPHORECTOMY;  Surgeon: Emily Filbert, MD;  Location: [facility];  Service: Gynecology;  Laterality: Bilateral;  . TUBAL LIGATION  1990's    There were no vitals filed for this visit.  Subjective Assessment - 06/15/17 0934    Subjective  The HEP made my upper back start spasming after 3 days so I quit the exercises. This has happened in the past and my upper back will lock up. Can we try some different  exercises.     Currently in Pain?  Yes    Pain Score  7     Pain Location  Back    Pain Orientation  Lower;Left;Right    Aggravating Factors   prolonged standing, walking, sitting.     Pain Relieving Factors  meds,                        [facility] Adult PT Treatment/Exercise - 06/15/17 0001      Lumbar Exercises: Supine   Ab Set  10 reps    AB Set Limitations  with breathing     Bent Knee Raise  10 reps      Lumbar Exercises: Quadruped   Madcat/Old Horse  10 reps    Other Quadruped Lumbar Exercises  childs pose with lateral 3 x 30 each       Modalities   Modalities  Moist Heat      Moist Heat Therapy   Number Minutes Moist Heat  15 Minutes    Moist Heat Location  Lumbar Spine             PT Education - 06/15/17 0959    Education provided  Yes  Education Details  HEP    Person(s) Educated  Patient    Methods  Explanation;Handout    Comprehension  Verbalized understanding       PT Short Term Goals - 05/25/17 1055      PT SHORT TERM GOAL #1   Title  She will be independent with iniital HEp     Time  2    Period  Weeks    Status  New      PT SHORT TERM GOAL #2   Title  Demo understanding  good posture for anteriolisthesis    Time  2    Period  Weeks    Status  New        PT Long Term Goals - 05/25/17 1056      PT LONG TERM GOAL #1   Title  She was independent with all HEP issued.     Time  6    Period  Weeks    Status  New      PT LONG TERM GOAL #2   Title  She will report pain decr in frequency by 50%     Time  6    Period  Weeks    Status  New      PT LONG TERM GOAL #3   Title  She will report intensity decr 50% when flared up    Time  6    Period  Weeks    Status  New            Plan - 06/15/17 1001    Clinical Impression Statement  Pt arrives reporting she did not perform her HEP over the last 3 weeks. She flet an increase in upper back spasms after 3 days of the HEP and requests to try new exercises. She did well  in quadruped and we added childs pose and cat/ camel to [place]. Trial of HMP to decrease pain. Did not use entire treatment time due to patient's reluctance to flare up her upper back pain.     PT Next Visit Plan  progres HEP,  modalities if needed, if she does well with new HEP, progress to quadruped core and dead bug     PT Home Exercise Plan  PPT , Knee to chest and adduction-(pt request to disc.due to upper back spasms)- added 06/15/2017- quadruped cat camel, childs pose with laterals.     Consulted and Agree with Plan of Care  Patient       Patient will benefit from skilled therapeutic intervention in order to improve the following deficits and impairments:  Increased muscle spasms, Pain, Postural dysfunction, Decreased strength  Visit Diagnosis: Abnormal posture  Muscle spasm of back  Muscle weakness (generalized)     Problem List Patient Active Problem List   Diagnosis Date Noted  . HTN (hypertension) 05/14/2017  . Post-operative state 03/17/2017  . Fibroids 01/09/2017  . Back pain, thoracic 12/21/2014  . Anemia 12/20/2014  . Dyspnea 12/20/2014  . Heavy menses 12/20/2014    Dorene Ar, PTA 06/15/2017, 10:06 AM  [facility] [address] [place], [place], [address] Phone: [phone]   Fax:  [phone]  Name: Jaleigha Deane MRN: [id number] Date of Birth: 1960/11/08

## 2017-06-17 ENCOUNTER — Ambulatory Visit: Payer: Self-pay | Admitting: Physical Therapy

## 2017-06-17 ENCOUNTER — Encounter: Payer: Self-pay | Admitting: Physical Therapy

## 2017-06-17 DIAGNOSIS — M6281 Muscle weakness (generalized): Secondary | ICD-10-CM

## 2017-06-17 DIAGNOSIS — R293 Abnormal posture: Secondary | ICD-10-CM

## 2017-06-17 DIAGNOSIS — M62830 Muscle spasm of back: Secondary | ICD-10-CM

## 2017-06-17 LAB — FECAL OCCULT BLOOD, IMMUNOCHEMICAL: Fecal Occult Bld: NEGATIVE

## 2017-06-17 NOTE — Therapy (Signed)
[place], [place], [address] Phone: [phone]   Fax:  [phone]  Physical Therapy Treatment  Patient Details  Name: Nina Hopkins MRN: [id number] Date of Birth: 05-Mar-1960 Referring Provider: [name] NP   Encounter Date: 06/17/2017  PT End of Session - 06/17/17 0854    Visit Number  3    Number of Visits  12    Date for PT Re-Evaluation  07/03/17    Authorization Type  CFA    PT Start Time  0851    PT Stop Time  0945    PT Time Calculation (min)  54 min       Past Medical History:  Diagnosis Date  . Anemia   . Anxiety ~ 2008   no meds  . Dyspnea    hx - r/t anemia per patient  . Family history of adverse reaction to anesthesia    "my sister had a hard time waking up"  . History of blood transfusion 12/20/2014   related to anemia  . Hyperlipidemia   . Hypertension   . Missed ab    x 1 - no surgery required  . SVD (spontaneous vaginal delivery)    x 2    Past Surgical History:  Procedure Laterality Date  . ABDOMINAL HYSTERECTOMY N/A 03/17/2017   Procedure: HYSTERECTOMY ABDOMINAL;  Surgeon: Emily Filbert, MD;  Location: [facility];  Service: Gynecology;  Laterality: N/A;  . CERVIX LESION DESTRUCTION     cryro  . CYSTOSCOPY N/A 03/17/2017   Procedure: CYSTOSCOPY;  Surgeon: Emily Filbert, MD;  Location: [facility];  Service: Gynecology;  Laterality: N/A;  . DILATION AND CURETTAGE, DIAGNOSTIC / THERAPEUTIC      in office  . SALPINGOOPHORECTOMY Bilateral 03/17/2017   Procedure: SALPINGO OOPHORECTOMY;  Surgeon: Emily Filbert, MD;  Location: [facility];  Service: Gynecology;  Laterality: Bilateral;  . TUBAL LIGATION  1990's    There were no vitals filed for this visit.  Subjective Assessment - 06/17/17 0853    Subjective  Low back pain and having upper back spasms intermittently, 2 times yesterday.     Currently in Pain?  Yes    Pain Score  6     Pain Location  Back    Pain Orientation   Lower;Right;Left    Pain Descriptors / Indicators  Aching;Sore                       [facility] Adult PT Treatment/Exercise - 06/17/17 0001      Lumbar Exercises: Stretches   Single Knee to Chest Stretch  30 seconds;2 reps    Figure 4 Stretch Limitations  modified x 2      Lumbar Exercises: Aerobic   Nustep  L5 UE/LE x 5 min       Lumbar Exercises: Supine   Clam  10 reps    Heel Slides  10 reps    Bent Knee Raise  10 reps    Straight Leg Raise  10 reps    Other Supine Lumbar Exercises  all exercises performed with abdominal draw in       Lumbar Exercises: Quadruped   Madcat/Old Horse  10 reps    Straight Leg Raise  10 reps cues for neutral spine and abdominal draw in     Other Quadruped Lumbar Exercises  childs pose with lateral 3 x 30 each       Moist Heat Therapy  Number Minutes Moist Heat  15 Minutes    Moist Heat Location  Lumbar Spine               PT Short Term Goals - 05/25/17 1055      PT SHORT TERM GOAL #1   Title  She will be independent with iniital HEp     Time  2    Period  Weeks    Status  New      PT SHORT TERM GOAL #2   Title  Demo understanding  good posture for anteriolisthesis    Time  2    Period  Weeks    Status  New        PT Long Term Goals - 05/25/17 1056      PT LONG TERM GOAL #1   Title  She was independent with all HEP issued.     Time  6    Period  Weeks    Status  New      PT LONG TERM GOAL #2   Title  She will report pain decr in frequency by 50%     Time  6    Period  Weeks    Status  New      PT LONG TERM GOAL #3   Title  She will report intensity decr 50% when flared up    Time  6    Period  Weeks    Status  New            Plan - 06/17/17 3664    Clinical Impression Statement  Pt reports relief of pain for one day after last session. She has not performed her HEP due to helping a friend with a death in her famiy. We reviewed HEP and repeated HMP per pt request. Began Nustep with good  tolerance. Progressed Quadruped without increased pain.     PT Next Visit Plan  progres HEP,  modalities if needed, if she does well with new HEP, progress to quadruped core and dead bug     PT Home Exercise Plan  PPT , Knee to chest and adduction-(pt request to disc.due to upper back spasms)- added 06/15/2017- quadruped cat camel, childs pose with laterals.     Consulted and Agree with Plan of Care  Patient       Patient will benefit from skilled therapeutic intervention in order to improve the following deficits and impairments:  Increased muscle spasms, Pain, Postural dysfunction, Decreased strength  Visit Diagnosis: Abnormal posture  Muscle spasm of back  Muscle weakness (generalized)     Problem List Patient Active Problem List   Diagnosis Date Noted  . HTN (hypertension) 05/14/2017  . Post-operative state 03/17/2017  . Fibroids 01/09/2017  . Back pain, thoracic 12/21/2014  . Anemia 12/20/2014  . Dyspnea 12/20/2014  . Heavy menses 12/20/2014    Dorene Ar , PTA 06/17/2017, 9:26 AM  [facility] [address] [place], [place], [address] Phone: [phone]   Fax:  [phone]  Name: Nina Hopkins MRN: [id number] Date of Birth: 1960-11-24

## 2017-06-22 ENCOUNTER — Ambulatory Visit: Payer: Self-pay

## 2017-06-22 DIAGNOSIS — R293 Abnormal posture: Secondary | ICD-10-CM

## 2017-06-22 DIAGNOSIS — M6281 Muscle weakness (generalized): Secondary | ICD-10-CM

## 2017-06-22 DIAGNOSIS — M62830 Muscle spasm of back: Secondary | ICD-10-CM

## 2017-06-22 NOTE — Therapy (Signed)
[place], [place], [address] Phone: [phone]   Fax:  [phone]  Physical Therapy Treatment  Patient Details  Name: Nina Hopkins MRN: [id number] Date of Birth: 11-Apr-1960 Referring Provider: [name] NP   Encounter Date: 06/22/2017  PT End of Session - 06/22/17 0852    Visit Number  4    Number of Visits  12    Date for PT Re-Evaluation  07/03/17    Authorization Type  CFA    PT Start Time  0851    PT Stop Time  0950    PT Time Calculation (min)  59 min    Activity Tolerance  Patient tolerated treatment well    Behavior During Therapy  [facility] for tasks assessed/performed       Past Medical History:  Diagnosis Date  . Anemia   . Anxiety ~ 2008   no meds  . Dyspnea    hx - r/t anemia per patient  . Family history of adverse reaction to anesthesia    "my sister had a hard time waking up"  . History of blood transfusion 12/20/2014   related to anemia  . Hyperlipidemia   . Hypertension   . Missed ab    x 1 - no surgery required  . SVD (spontaneous vaginal delivery)    x 2    Past Surgical History:  Procedure Laterality Date  . ABDOMINAL HYSTERECTOMY N/A 03/17/2017   Procedure: HYSTERECTOMY ABDOMINAL;  Surgeon: Emily Filbert, MD;  Location: [facility];  Service: Gynecology;  Laterality: N/A;  . CERVIX LESION DESTRUCTION     cryro  . CYSTOSCOPY N/A 03/17/2017   Procedure: CYSTOSCOPY;  Surgeon: Emily Filbert, MD;  Location: [facility];  Service: Gynecology;  Laterality: N/A;  . DILATION AND CURETTAGE, DIAGNOSTIC / THERAPEUTIC      in office  . SALPINGOOPHORECTOMY Bilateral 03/17/2017   Procedure: SALPINGO OOPHORECTOMY;  Surgeon: Emily Filbert, MD;  Location: [facility];  Service: Gynecology;  Laterality: Bilateral;  . TUBAL LIGATION  1990's    There were no vitals filed for this visit.  Subjective Assessment - 06/22/17 0856    Subjective  Having upper back spasms. Have had them in past .  started with initial stretching exercises.       Pain Score  9     Pain Location  Back    Pain Orientation  Upper;Lower    Pain Descriptors / Indicators  Aching;Spasm    Pain Onset  More than a month ago    Pain Frequency  Constant    Aggravating Factors   stand sit walking    Pain Relieving Factors  meds ( advil)                       [facility] Adult PT Treatment/Exercise - 06/22/17 0001      Lumbar Exercises: Stretches   Other Lumbar Stretch Exercise  rhomboid RT /LT with slight trunk rotation.  RT/LT  x 30 sec x2      Lumbar Exercises: Aerobic   Nustep  L5 /LE x 5 min       Lumbar Exercises: Supine   Clam  10 reps with PPT    Bent Knee Raise  5 reps with PPT      Lumbar Exercises: Quadruped   Madcat/Old Horse  10 reps    Single Arm Raise  Right;Left    Straight Leg Raise  10 reps  Other Quadruped Lumbar Exercises  childs pose with lateral  30 sec each       Modalities   Modalities  Electrical Stimulation      Moist Heat Therapy   Number Minutes Moist Heat  15 Minutes    Moist Heat Location  Lumbar Spine      Electrical Stimulation   Electrical Stimulation Location  back    Electrical Stimulation Action  IFC    Electrical Stimulation Parameters  L10    Electrical Stimulation Goals  Pain             PT Education - 06/22/17 [phone]    Education provided  Yes    Education Details  rhomboid stretch    Person(s) Educated  Patient    Methods  Explanation;Demonstration;Tactile cues;Verbal cues;Handout    Comprehension  Returned demonstration;Verbalized understanding       PT Short Term Goals - 05/25/17 1055      PT SHORT TERM GOAL #1   Title  She will be independent with iniital HEp     Time  2    Period  Weeks    Status  New      PT SHORT TERM GOAL #2   Title  Demo understanding  good posture for anteriolisthesis    Time  2    Period  Weeks    Status  New        PT Long Term Goals - 05/25/17 1056      PT LONG TERM GOAL #1   Title   She was independent with all HEP issued.     Time  6    Period  Weeks    Status  New      PT LONG TERM GOAL #2   Title  She will report pain decr in frequency by 50%     Time  6    Period  Weeks    Status  New      PT LONG TERM GOAL #3   Title  She will report intensity decr 50% when flared up    Time  6    Period  Weeks    Status  New            Plan - 06/22/17 9604    Clinical Impression Statement  Pain increased.  She was able to do all exercises without incr pain. We had discussion about medication . Suggested she talk to her MD to assess appropriate  medicine and dosage    PT Treatment/Interventions  Moist Heat;Therapeutic exercise;Manual techniques;Passive range of motion;Patient/family education    PT Next Visit Plan  progres HEP,  modalities if needed, if she does well with new HEP, progress to quadruped core and dead bug     PT Home Exercise Plan  PPT , Knee to chest and adduction-(pt request to disc.due to upper back spasms)- added 06/15/2017- quadruped cat camel, childs pose with laterals.  Rhomboid stretch    Consulted and Agree with Plan of Care  Patient       Patient will benefit from skilled therapeutic intervention in order to improve the following deficits and impairments:  Increased muscle spasms, Pain, Postural dysfunction, Decreased strength  Visit Diagnosis: Abnormal posture  Muscle spasm of back  Muscle weakness (generalized)     Problem List Patient Active Problem List   Diagnosis Date Noted  . HTN (hypertension) 05/14/2017  . Post-operative state 03/17/2017  . Fibroids 01/09/2017  . Back pain, thoracic 12/21/2014  .  Anemia 12/20/2014  . Dyspnea 12/20/2014  . Heavy menses 12/20/2014    [name]  PT 06/22/2017, 9:43 AM  [facility] [address] [place], [place], [address] Phone: [phone]   Fax:  [phone]  Name: Nina Hopkins MRN: [id number] Date of Birth:  1960-12-03

## 2017-06-22 NOTE — Patient Instructions (Signed)
From cabinet Rhomboids stretch x3 RT/LT 15 sec 3x/day

## 2017-06-24 ENCOUNTER — Encounter: Payer: Self-pay | Admitting: Physical Therapy

## 2017-06-24 ENCOUNTER — Ambulatory Visit: Payer: Self-pay | Admitting: Physical Therapy

## 2017-06-24 DIAGNOSIS — M62830 Muscle spasm of back: Secondary | ICD-10-CM

## 2017-06-24 DIAGNOSIS — R293 Abnormal posture: Secondary | ICD-10-CM

## 2017-06-24 DIAGNOSIS — M6281 Muscle weakness (generalized): Secondary | ICD-10-CM

## 2017-06-24 NOTE — Therapy (Signed)
[place], [place], [address] Phone: [phone]   Fax:  [phone]  Physical Therapy Treatment  Patient Details  Name: Nina Hopkins MRN: [id number] Date of Birth: Nov 07, 1960 Referring Provider: [name] NP   Encounter Date: 06/24/2017  PT End of Session - 06/24/17 0850    Visit Number  5    Number of Visits  12    Date for PT Re-Evaluation  07/03/17    Authorization Type  CFA    PT Start Time  0845    PT Stop Time  0923    PT Time Calculation (min)  38 min       Past Medical History:  Diagnosis Date  . Anemia   . Anxiety ~ 2008   no meds  . Dyspnea    hx - r/t anemia per patient  . Family history of adverse reaction to anesthesia    "my sister had a hard time waking up"  . History of blood transfusion 12/20/2014   related to anemia  . Hyperlipidemia   . Hypertension   . Missed ab    x 1 - no surgery required  . SVD (spontaneous vaginal delivery)    x 2    Past Surgical History:  Procedure Laterality Date  . ABDOMINAL HYSTERECTOMY N/A 03/17/2017   Procedure: HYSTERECTOMY ABDOMINAL;  Surgeon: Emily Filbert, MD;  Location: [facility];  Service: Gynecology;  Laterality: N/A;  . CERVIX LESION DESTRUCTION     cryro  . CYSTOSCOPY N/A 03/17/2017   Procedure: CYSTOSCOPY;  Surgeon: Emily Filbert, MD;  Location: [facility];  Service: Gynecology;  Laterality: N/A;  . DILATION AND CURETTAGE, DIAGNOSTIC / THERAPEUTIC      in office  . SALPINGOOPHORECTOMY Bilateral 03/17/2017   Procedure: SALPINGO OOPHORECTOMY;  Surgeon: Emily Filbert, MD;  Location: [facility];  Service: Gynecology;  Laterality: Bilateral;  . TUBAL LIGATION  1990's    There were no vitals filed for this visit.  Subjective Assessment - 06/24/17 0849    Subjective  Pain better. Okay during the day, more pain at night. Going to ask my doctor if I can take more than one advil per day.     Pain Score  0-No pain                        [facility] Adult PT Treatment/Exercise - 06/24/17 0001      Lumbar Exercises: Stretches   Figure 4 Stretch Limitations  modified x 2    Other Lumbar Stretch Exercise  rhomboid RT /LT with slight trunk rotation.  RT/LT  x 30 sec x2      Lumbar Exercises: Supine   Dead Bug  20 reps    Straight Leg Raise  15 reps      Lumbar Exercises: Quadruped   Madcat/Old Horse  10 reps    Single Arm Raise  10 reps    Straight Leg Raise  5 reps    Opposite Arm/Leg Raise  10 reps    Other Quadruped Lumbar Exercises  childs pose with lateral  30 sec each              PT Education - 06/24/17 0923    Education provided  Yes    Education Details  bird dogs, dead bug    Person(s) Educated  Patient    Methods  Explanation;Handout    Comprehension  Verbalized understanding  PT Short Term Goals - 05/25/17 1055      PT SHORT TERM GOAL #1   Title  She will be independent with iniital HEp     Time  2    Period  Weeks    Status  New      PT SHORT TERM GOAL #2   Title  Demo understanding  good posture for anteriolisthesis    Time  2    Period  Weeks    Status  New        PT Long Term Goals - 05/25/17 1056      PT LONG TERM GOAL #1   Title  She was independent with all HEP issued.     Time  6    Period  Weeks    Status  New      PT LONG TERM GOAL #2   Title  She will report pain decr in frequency by 50%     Time  6    Period  Weeks    Status  New      PT LONG TERM GOAL #3   Title  She will report intensity decr 50% when flared up    Time  6    Period  Weeks    Status  New            Plan - 06/24/17 0911    Clinical Impression Statement  No pain today. She reports back pain less overall and no longer has "tugging" feeling. Progressed quadruped to bird dogs with difficulty however no increased pain. Progressed supine core to basic dead bug exercise without increased pain. Updated HEP. No increased pain at end of session. Declined  modalities.     PT Next Visit Plan  check goals ,progres HEP,  modalities if needed, if she does well with new HEP, review  to quadruped core and dead bug     PT Home Exercise Plan  PPT , Knee to chest and adduction-(pt request to disc.due to upper back spasms)- added 06/15/2017- quadruped cat camel, childs pose with laterals.  Rhomboid stretch    Consulted and Agree with Plan of Care  Patient       Patient will benefit from skilled therapeutic intervention in order to improve the following deficits and impairments:  Increased muscle spasms, Pain, Postural dysfunction, Decreased strength  Visit Diagnosis: Abnormal posture  Muscle spasm of back  Muscle weakness (generalized)     Problem List Patient Active Problem List   Diagnosis Date Noted  . HTN (hypertension) 05/14/2017  . Post-operative state 03/17/2017  . Fibroids 01/09/2017  . Back pain, thoracic 12/21/2014  . Anemia 12/20/2014  . Dyspnea 12/20/2014  . Heavy menses 12/20/2014    Dorene Ar, PTA 06/24/2017, 9:24 AM  [facility] [address] [place], [place], [address] Phone: [phone]   Fax:  [phone]  Name: Nina Hopkins MRN: [id number] Date of Birth: 01-21-1961

## 2017-06-25 ENCOUNTER — Ambulatory Visit
Admission: RE | Admit: 2017-06-25 | Discharge: 2017-06-25 | Disposition: A | Discharge status: Home / Self Care | Payer: No Typology Code available for payment source | Source: Ambulatory Visit | Attending: Obstetrics and Gynecology | Admitting: Obstetrics and Gynecology

## 2017-06-25 ENCOUNTER — Ambulatory Visit: Payer: No Typology Code available for payment source

## 2017-06-25 ENCOUNTER — Ambulatory Visit (HOSPITAL_COMMUNITY)
Admission: RE | Admit: 2017-06-25 | Discharge: 2017-06-25 | Disposition: A | Discharge status: Home / Self Care | Payer: Self-pay | Source: Ambulatory Visit | Attending: Obstetrics and Gynecology | Admitting: Obstetrics and Gynecology

## 2017-06-25 ENCOUNTER — Encounter (HOSPITAL_COMMUNITY): Payer: Self-pay

## 2017-06-25 VITALS — BP 141/90 | Ht 61.0 in

## 2017-06-25 DIAGNOSIS — Z1239 Encounter for other screening for malignant neoplasm of breast: Secondary | ICD-10-CM

## 2017-06-25 DIAGNOSIS — R928 Other abnormal and inconclusive findings on diagnostic imaging of breast: Secondary | ICD-10-CM

## 2017-06-25 NOTE — Patient Instructions (Signed)
Explained breast self awareness with Isabel Renee Derrig. Patient did not need a Pap smear today due to patient has a history of a hysterectomy for benign reasons. Let patient know that she doesn't need any further Pap smears due to her history of a hysterectomy for benign reasons. Referred patient to the [facility] for left breast diagnostic mammogram per recommendation. Appointment scheduled for Thursday, Jun 25, 2017 at 1500. [place] verbalized understanding.  Aloh[place]ani Levenhagen, Arvil Chaco, RN 3:34 PM

## 2017-06-25 NOTE — Progress Notes (Signed)
Patient referred due to [facility] by the [facility] due to recommending additional imaging of the left breast. Screening mammogram completed 05/14/2017.  Pap Smear: Pap smear not completed today. Last Pap smear was in November 2018 and normal per patient. Patient had a Pap smear 10/26/2015 in [facility] and normal with negative HPV. Per patient has no history of an abnormal Pap smear. Patient has a history of a hysterectomy 03/17/2017 due to AUB. Patient doesn't need any further Pap smears due to her history of a hysterectomy for benign reasons per BCCCP and ACOG guidelines. Last Pap smear result is not in Epic. Previous Pap smear 10/26/2015 and hysterectomy results are in [facility].  Physical exam: Breasts Breasts symmetrical. No skin abnormalities bilateral breasts. No nipple retraction bilateral breasts. No nipple discharge bilateral breasts. No lymphadenopathy. No lumps palpated bilateral breasts. No complaints of pain or tenderness on exam. Referred patient to the [facility] for left breast diagnostic mammogram per recommendation. Appointment scheduled for Thursday, Jun 25, 2017 at 1500.        Pelvic/Bimanual No Pap smear completed today since patient has a history of a hysterectomy for benign reasons. Pap smear not indicated per BCCCP guidelines.   Smoking History: Patient has never smoked.  Patient Navigation: Patient education provided. Access to services provided for patient through [place] program.   Colorectal Cancer Screening: Per patient has never had a colonoscopy completed. No complaints today. FIT test completed 04/27/2014 that was negative. FIT Test given to patient to complete and return to BCCCP.  Breast and Cervical Cancer Risk Assessment: Patient has a family history of her maternal aunt having breast cancer. Patient has no known genetic mutations or history of radiation treatment to the chest before age 43. Patient has no history of cervical dysplasia,  immunocompromised, or DES exposure in-utero. Patient has a 5-year risk of breast cancer at 1.4% and a lifetime risk at 7.8%.

## 2017-06-26 ENCOUNTER — Other Ambulatory Visit: Payer: No Typology Code available for payment source

## 2017-06-30 ENCOUNTER — Ambulatory Visit: Payer: Self-pay

## 2017-06-30 DIAGNOSIS — R293 Abnormal posture: Secondary | ICD-10-CM

## 2017-06-30 DIAGNOSIS — M6281 Muscle weakness (generalized): Secondary | ICD-10-CM

## 2017-06-30 DIAGNOSIS — M62830 Muscle spasm of back: Secondary | ICD-10-CM

## 2017-06-30 MED FILL — HYDROCHLOROTHIAZIDE 25 MG T: 25 | 30 days supply | Qty: 30 | Fill #0

## 2017-06-30 NOTE — Patient Instructions (Signed)
Issued shoulder bridge x 10 rpes 1x/day

## 2017-06-30 NOTE — Therapy (Signed)
[place], [place], [address] Phone: [phone]   Fax:  [phone]  Physical Therapy Treatment  Patient Details  Name: Nina Hopkins MRN: [id number] Date of Birth: [birth year] Referring Provider: [name] NP   Encounter Date: 06/30/2017  PT End of Session - 06/30/17 0921    Visit Number  6    Number of Visits  12    Date for PT Re-Evaluation  07/03/17    Authorization Type  CFA    PT Start Time  0920    PT Stop Time  1000    PT Time Calculation (min)  40 min    Activity Tolerance  Patient tolerated treatment well    Behavior During Therapy  [facility] for tasks assessed/performed       Past Medical History:  Diagnosis Date  . Anemia   . Anxiety ~ 2008   no meds  . Dyspnea    hx - r/t anemia per patient  . Family history of adverse reaction to anesthesia    "my sister had a hard time waking up"  . History of blood transfusion 12/20/2014   related to anemia  . Hyperlipidemia   . Hypertension   . Missed ab    x 1 - no surgery required  . SVD (spontaneous vaginal delivery)    x 2    Past Surgical History:  Procedure Laterality Date  . ABDOMINAL HYSTERECTOMY N/A 03/17/2017   Procedure: HYSTERECTOMY ABDOMINAL;  Surgeon: Emily Filbert, MD;  Location: [facility];  Service: Gynecology;  Laterality: N/A;  . CERVIX LESION DESTRUCTION     cryro  . CYSTOSCOPY N/A 03/17/2017   Procedure: CYSTOSCOPY;  Surgeon: Emily Filbert, MD;  Location: [facility];  Service: Gynecology;  Laterality: N/A;  . DILATION AND CURETTAGE, DIAGNOSTIC / THERAPEUTIC      in office  . SALPINGOOPHORECTOMY Bilateral 03/17/2017   Procedure: SALPINGO OOPHORECTOMY;  Surgeon: Emily Filbert, MD;  Location: [facility];  Service: Gynecology;  Laterality: Bilateral;  . TUBAL LIGATION  1990's    There were no vitals filed for this visit.  Subjective Assessment - 06/30/17 0924    Subjective  Back feel fine . Knees sore mild /mod pain.                         [facility] Adult PT Treatment/Exercise - 06/30/17 0001      Lumbar Exercises: Aerobic   Nustep  L4 LE 5 min      Lumbar Exercises: Supine   Dead Bug  20 reps    Straight Leg Raise  15 reps RT/LT cued to pul with abdominals    Other Supine Lumbar Exercises  Shoulder bridge . spent much time on technique and keeping abs involved.   Also spent some time with weight (3 pounds appear good weight to start ) with lifting over head and to 90 degrees flexion and abduciton She will by 3 pounds for homew      Lumbar Exercises: Quadruped   Madcat/Old Horse  10 reps    Single Arm Raise  10 reps    Straight Leg Raise  5 reps    Opposite Arm/Leg Raise  Right arm/Left leg;Left arm/Right leg;5 reps    Other Quadruped Lumbar Exercises  childs pose with lateral  30 sec each              PT Education - 06/30/17 1006    Education  provided  Yes    Education Details  shoulder bridge, limit lift to 3 pounds 5 reps to start, also with model instruct on anatomy of spondylolisthisis    Person(s) Educated  Patient    Methods  Explanation;Handout;Demonstration;Verbal cues;Tactile cues    Comprehension  Verbalized understanding;Returned demonstration       PT Short Term Goals - 05/25/17 1055      PT SHORT TERM GOAL #1   Title  She will be independent with iniital HEp     Time  2    Period  Weeks    Status  New      PT SHORT TERM GOAL #2   Title  Demo understanding  good posture for anteriolisthesis    Time  2    Period  Weeks    Status  New        PT Long Term Goals - 05/25/17 1056      PT LONG TERM GOAL #1   Title  She was independent with all HEP issued.     Time  6    Period  Weeks    Status  New      PT LONG TERM GOAL #2   Title  She will report pain decr in frequency by 50%     Time  6    Period  Weeks    Status  New      PT LONG TERM GOAL #3   Title  She will report intensity decr 50% when flared up    Time  6    Period  Weeks    Status   New            Plan - 06/30/17 5956    Clinical Impression Statement  No pain with exer ies and sshe appears to have PPT and shoulder bridge technique much better.     PT Treatment/Interventions  Moist Heat;Therapeutic exercise;Manual techniques;Passive range of motion;Patient/family education    PT Home Exercise Plan  PPT , Knee to chest and adduction-(pt request to disc.due to upper back spasms)- added 06/15/2017- quadruped cat camel, childs pose with laterals.  Rhomboid stretch    Consulted and Agree with Plan of Care  Patient       Patient will benefit from skilled therapeutic intervention in order to improve the following deficits and impairments:  Increased muscle spasms, Pain, Postural dysfunction, Decreased strength  Visit Diagnosis: Abnormal posture  Muscle spasm of back  Muscle weakness (generalized)     Problem List Patient Active Problem List   Diagnosis Date Noted  . HTN (hypertension) 05/14/2017  . Post-operative state 03/17/2017  . Fibroids 01/09/2017  . Back pain, thoracic 12/21/2014  . Anemia 12/20/2014  . Dyspnea 12/20/2014  . Heavy menses 12/20/2014    Nina Hopkins  PT 06/30/2017, 10:09 AM  [facility] [address] [place], [place], [address] Phone: [phone]   Fax:  [phone]  Name: Nina Hopkins MRN: [id number] Date of Birth: 01-Jun-1960

## 2017-07-02 ENCOUNTER — Ambulatory Visit: Payer: Self-pay | Admitting: Physical Therapy

## 2017-07-02 ENCOUNTER — Encounter: Payer: Self-pay | Admitting: Physical Therapy

## 2017-07-02 DIAGNOSIS — M6281 Muscle weakness (generalized): Secondary | ICD-10-CM

## 2017-07-02 DIAGNOSIS — M62830 Muscle spasm of back: Secondary | ICD-10-CM

## 2017-07-02 DIAGNOSIS — R293 Abnormal posture: Secondary | ICD-10-CM

## 2017-07-02 NOTE — Patient Instructions (Addendum)
  Side Twist, Kneeling   Kneel, buttocks on heels. Fold upper body forward from hips. Then reach to each side as far as possible, keeping chest low toward floor. Hold each position _30__ seconds. Repeat __3_ times per session. Do __2_ sessions per day.  Upper / Lower Extremity Extension (All-Fours)    Tighten stomach and raise right leg and opposite arm. Keep trunk rigid. Repeat __10__ times per set. Do __1-2__ sets per session. Do __2__ sessions per day.

## 2017-07-02 NOTE — Therapy (Signed)
[place], [place], [address] Phone: [phone]   Fax:  [phone]  Physical Therapy Treatment  Patient Details  Name: Nina Hopkins MRN: [id number] Date of Birth: 04/02/1960 Referring Provider: [name] NP   Encounter Date: 07/02/2017  PT End of Session - 07/02/17 1039    Visit Number  7    Number of Visits  12    Date for PT Re-Evaluation  07/03/17    Authorization Type  CFA    PT Start Time  1017    PT Stop Time  1059    PT Time Calculation (min)  42 min       Past Medical History:  Diagnosis Date  . Anemia   . Anxiety ~ 2008   no meds  . Dyspnea    hx - r/t anemia per patient  . Family history of adverse reaction to anesthesia    "my sister had a hard time waking up"  . History of blood transfusion 12/20/2014   related to anemia  . Hyperlipidemia   . Hypertension   . Missed ab    x 1 - no surgery required  . SVD (spontaneous vaginal delivery)    x 2    Past Surgical History:  Procedure Laterality Date  . ABDOMINAL HYSTERECTOMY N/A 03/17/2017   Procedure: HYSTERECTOMY ABDOMINAL;  Surgeon: Emily Filbert, MD;  Location: [facility];  Service: Gynecology;  Laterality: N/A;  . CERVIX LESION DESTRUCTION     cryro  . CYSTOSCOPY N/A 03/17/2017   Procedure: CYSTOSCOPY;  Surgeon: Emily Filbert, MD;  Location: [facility];  Service: Gynecology;  Laterality: N/A;  . DILATION AND CURETTAGE, DIAGNOSTIC / THERAPEUTIC      in office  . SALPINGOOPHORECTOMY Bilateral 03/17/2017   Procedure: SALPINGO OOPHORECTOMY;  Surgeon: Emily Filbert, MD;  Location: [facility];  Service: Gynecology;  Laterality: Bilateral;  . TUBAL LIGATION  1990's    There were no vitals filed for this visit.  Subjective Assessment - 07/02/17 1022    Currently in Pain?  No/denies    Aggravating Factors   standing still, bending over , sitting too long, riding bus     Pain Relieving Factors  keep moving, advil                         [facility] Adult PT Treatment/Exercise - 07/02/17 0001      Therapeutic Activites    Therapeutic Activities  ADL's;Lifting    ADL's  golfers pick up to reach into top loaders/ front loaders, squat vs stool for getting clothes out of dryer    Lifting  hip hinge for ADLS including llight lifting , picking up around home, caring for 55 year old       Lumbar Exercises: Stretches   Single Knee to Chest Stretch  30 seconds;2 reps    Figure 4 Stretch Limitations  modified x 2      Lumbar Exercises: Aerobic   Nustep  L5 LE 5 min      Lumbar Exercises: Seated   Sit to Stand  10 reps    Sit to Stand Limitations  focusing on hip hinge , no UE       Lumbar Exercises: Supine   Dead Bug  20 reps    Straight Leg Raise  15 reps RT/LT cued to pul with abdominals    Other Supine Lumbar Exercises  shoulder bridge x 10  Lumbar Exercises: Quadruped   Madcat/Old Horse  10 reps    Opposite Arm/Leg Raise  Right arm/Left leg;Left arm/Right leg;10 reps    Other Quadruped Lumbar Exercises  childs pose with lateral  30 sec each              PT Education - 07/02/17 1058    Education provided  Yes    Education Details  HEP     Person(s) Educated  Patient    Methods  Explanation;Handout    Comprehension  Verbalized understanding       PT Short Term Goals - 07/02/17 1048      PT SHORT TERM GOAL #1   Title  She will be independent with iniital HEp     Time  2    Period  Weeks    Status  Achieved      PT SHORT TERM GOAL #2   Title  Demo understanding  good posture for anteriolisthesis    Time  2    Period  Weeks    Status  Achieved        PT Long Term Goals - 07/02/17 1049      PT LONG TERM GOAL #1   Title  She was independent with all HEP issued.     Time  6    Period  Weeks    Status  Achieved      PT LONG TERM GOAL #2   Title  She will report pain decr in frequency by 50%     Baseline  40% decreased     Time  6    Period  Weeks    Status   Partially Met      PT LONG TERM GOAL #3   Title  She will report intensity decr 50% when flared up    Baseline  Pain intensity same , frequency less  and she reports able to manage pain    Time  6    Period  Weeks    Status  On-going            Plan - 07/02/17 1023    Clinical Impression Statement  Pt reports significant improvement in morning pain when using log roll. She is independent with HEP. SHe is paying attention to proper posure at home. STG#1, #2 met. Tolerates sitting 2 hours at least, sometimes more. Prefers walking/standing rather than sitting. Back overall better however knees are stiff after prolonged positions. She report 40% in frequency of pain however pain intensity about the same. She also reports pain with laundry and chores at home. She admits to bending at lumbar spine without squatting most of the time. Spent time instucting her in proper squat/ hip hinge for light lifting  as well as golfers pick up and possible stool for laundry room. Pt will pay attention to things that cause pain and her body mechanics. No increased pain during treatment,     PT Next Visit Plan  check goals, FOTO, possible DC, give body mechanics handout     PT Home Exercise Plan  PPT , Knee to chest and adduction-(pt request to disc.due to upper back spasms)- added 06/15/2017- quadruped cat camel, childs pose with laterals.  added 5/30- quadruped alt UE/LE raise, dead bug     Consulted and Agree with Plan of Care  Patient       Patient will benefit from skilled therapeutic intervention in order to improve the following deficits and impairments:  Increased muscle spasms,  Pain, Postural dysfunction, Decreased strength  Visit Diagnosis: Abnormal posture  Muscle spasm of back  Muscle weakness (generalized)     Problem List Patient Active Problem List   Diagnosis Date Noted  . HTN (hypertension) 05/14/2017  . Post-operative state 03/17/2017  . Fibroids 01/09/2017  . Back pain,  thoracic 12/21/2014  . Anemia 12/20/2014  . Dyspnea 12/20/2014  . Heavy menses 12/20/2014    Dorene Ar, PTA 07/02/2017, 11:12 AM  [place], [place], [address] Phone: [phone]   Fax:  [phone]  Name: Nina Hopkins MRN: [id number] Date of Birth: December 01, 1960

## 2017-07-06 ENCOUNTER — Ambulatory Visit: Payer: Self-pay | Attending: Nurse Practitioner

## 2017-07-06 DIAGNOSIS — M62830 Muscle spasm of back: Secondary | ICD-10-CM | POA: Insufficient documentation

## 2017-07-06 DIAGNOSIS — R293 Abnormal posture: Secondary | ICD-10-CM | POA: Insufficient documentation

## 2017-07-06 DIAGNOSIS — M6281 Muscle weakness (generalized): Secondary | ICD-10-CM | POA: Insufficient documentation

## 2017-07-06 NOTE — Therapy (Signed)
[place], [place], [address] Phone: [phone]   Fax:  [phone]  Physical Therapy Treatment  Patient Details  Name: Nina Hopkins MRN: [id number] Date of Birth: September 08, 1960 Referring Provider: [name] NP   Encounter Date: 07/06/2017  PT End of Session - 07/06/17 1016    Visit Number  8    Number of Visits  12    Date for PT Re-Evaluation  07/03/17    Authorization Type  CFA    PT Start Time  1015    PT Stop Time  1112    PT Time Calculation (min)  57 min    Activity Tolerance  Patient tolerated treatment well       Past Medical History:  Diagnosis Date  . Anemia   . Anxiety ~ 2008   no meds  . Dyspnea    hx - r/t anemia per patient  . Family history of adverse reaction to anesthesia    "my sister had a hard time waking up"  . History of blood transfusion 12/20/2014   related to anemia  . Hyperlipidemia   . Hypertension   . Missed ab    x 1 - no surgery required  . SVD (spontaneous vaginal delivery)    x 2    Past Surgical History:  Procedure Laterality Date  . ABDOMINAL HYSTERECTOMY N/A 03/17/2017   Procedure: HYSTERECTOMY ABDOMINAL;  Surgeon: Emily Filbert, MD;  Location: [facility];  Service: Gynecology;  Laterality: N/A;  . CERVIX LESION DESTRUCTION     cryro  . CYSTOSCOPY N/A 03/17/2017   Procedure: CYSTOSCOPY;  Surgeon: Emily Filbert, MD;  Location: [facility];  Service: Gynecology;  Laterality: N/A;  . DILATION AND CURETTAGE, DIAGNOSTIC / THERAPEUTIC      in office  . SALPINGOOPHORECTOMY Bilateral 03/17/2017   Procedure: SALPINGO OOPHORECTOMY;  Surgeon: Emily Filbert, MD;  Location: [facility];  Service: Gynecology;  Laterality: Bilateral;  . TUBAL LIGATION  1990's    There were no vitals filed for this visit.  Subjective Assessment - 07/06/17 1019    Subjective  RT lower back pakin last 2 days , fine 3 days ago.   She takes meds. She is not sure stretches are making  difference. takes meds so may be better due to this.   Stretching may cause upper back pain    Pain Score  5     Pain Location  Back    Pain Orientation  Right    Pain Descriptors / Indicators  Aching;Spasm    Pain Type  Chronic pain    Pain Onset  More than a month ago    Pain Frequency  Intermittent    Aggravating Factors   varies by day and not sure what causes pain for that day.     Pain Relieving Factors  meds.                        [facility] Adult PT Treatment/Exercise - 07/06/17 0001      Lumbar Exercises: Aerobic   Nustep  L5 LE 5 min      Lumbar Exercises: Supine   Pelvic Tilt  15 reps 15 sec hold.     Dead Bug  20 reps    Straight Leg Raise  15 reps with abdominal cues      Lumbar Exercises: Quadruped   Madcat/Old Horse  10 reps    Opposite Arm/Leg Raise  Right arm/Left leg;Left arm/Right leg;10 reps    Other Quadruped Lumbar Exercises  childs pose with lateral  30 sec each       Moist Heat Therapy   Number Minutes Moist Heat  12 Minutes    Moist Heat Location  -- RT lower back and hip      Manual Therapy   Manual therapy comments  stretching lats and RT flank and Iastim to paraspinals and QL more RT.                PT Short Term Goals - 07/02/17 1048      PT SHORT TERM GOAL #1   Title  She will be independent with iniital HEp     Time  2    Period  Weeks    Status  Achieved      PT SHORT TERM GOAL #2   Title  Demo understanding  good posture for anteriolisthesis    Time  2    Period  Weeks    Status  Achieved        PT Long Term Goals - 07/02/17 1049      PT LONG TERM GOAL #1   Title  She was independent with all HEP issued.     Time  6    Period  Weeks    Status  Achieved      PT LONG TERM GOAL #2   Title  She will report pain decr in frequency by 50%     Baseline  40% decreased     Time  6    Period  Weeks    Status  Partially Met      PT LONG TERM GOAL #3   Title  She will report intensity decr 50% when flared up     Baseline  Pain intensity same , frequency less  and she reports able to manage pain    Time  6    Period  Weeks    Status  On-going            Plan - 07/06/17 1016    Clinical Impression Statement  Will stop stretching )with pulling and have pt only do side lye and quadraped stretching and strength activity for now.     PT Treatment/Interventions  Moist Heat;Therapeutic exercise;Manual techniques;Passive range of motion;Patient/family education    PT Next Visit Plan  Core strength and side lye  stretch    PT Home Exercise Plan  PPT , Knee to chest and adduction-(pt request to disc.due to upper back spasms)- added 06/15/2017- quadruped cat camel, childs pose with laterals.  added 5/30- quadruped alt UE/LE raise, dead bug , side stretch  in sidlye , and arm openings    Consulted and Agree with Plan of Care  Patient       Patient will benefit from skilled therapeutic intervention in order to improve the following deficits and impairments:  Increased muscle spasms, Pain, Postural dysfunction, Decreased strength  Visit Diagnosis: Abnormal posture  Muscle spasm of back  Muscle weakness (generalized)     Problem List Patient Active Problem List   Diagnosis Date Noted  . HTN (hypertension) 05/14/2017  . Post-operative state 03/17/2017  . Fibroids 01/09/2017  . Back pain, thoracic 12/21/2014  . Anemia 12/20/2014  . Dyspnea 12/20/2014  . Heavy menses 12/20/2014    [name]  PT 07/06/2017, 11:05 AM  [facility] [address] [place], [place], [address] Phone: [phone]  Fax:  [phone]  Name: Nina Hopkins MRN: [id number] Date of Birth: 11-Jan-1961

## 2017-07-08 ENCOUNTER — Other Ambulatory Visit (HOSPITAL_COMMUNITY): Payer: Self-pay | Admitting: *Deleted

## 2017-07-08 DIAGNOSIS — Z0000 Encounter for general adult medical examination without abnormal findings: Secondary | ICD-10-CM

## 2017-07-09 ENCOUNTER — Ambulatory Visit: Payer: Self-pay

## 2017-07-09 DIAGNOSIS — R293 Abnormal posture: Secondary | ICD-10-CM

## 2017-07-09 DIAGNOSIS — M62830 Muscle spasm of back: Secondary | ICD-10-CM

## 2017-07-09 DIAGNOSIS — M6281 Muscle weakness (generalized): Secondary | ICD-10-CM

## 2017-07-09 NOTE — Patient Instructions (Signed)
Body mechanics limiting bending and twisiting

## 2017-07-09 NOTE — Therapy (Signed)
[place], [place], [address] Phone: [phone]   Fax:  [phone]  Physical Therapy Treatment  Patient Details  Name: Nina Hopkins MRN: [id number] Date of Birth: 1960-06-20 Referring Provider: [name] NP   Encounter Date: 07/09/2017  PT End of Session - 07/09/17 0846    Visit Number  9    Number of Visits  12    Date for PT Re-Evaluation  07/24/17    Authorization Type  CFA    PT Start Time  0846    PT Stop Time  0930    PT Time Calculation (min)  44 min    Activity Tolerance  Patient tolerated treatment well    Behavior During Therapy  [facility] for tasks assessed/performed       Past Medical History:  Diagnosis Date  . Anemia   . Anxiety ~ 2008   no meds  . Dyspnea    hx - r/t anemia per patient  . Family history of adverse reaction to anesthesia    "my sister had a hard time waking up"  . History of blood transfusion 12/20/2014   related to anemia  . Hyperlipidemia   . Hypertension   . Missed ab    x 1 - no surgery required  . SVD (spontaneous vaginal delivery)    x 2    Past Surgical History:  Procedure Laterality Date  . ABDOMINAL HYSTERECTOMY N/A 03/17/2017   Procedure: HYSTERECTOMY ABDOMINAL;  Surgeon: Emily Filbert, MD;  Location: [facility];  Service: Gynecology;  Laterality: N/A;  . CERVIX LESION DESTRUCTION     cryro  . CYSTOSCOPY N/A 03/17/2017   Procedure: CYSTOSCOPY;  Surgeon: Emily Filbert, MD;  Location: [facility];  Service: Gynecology;  Laterality: N/A;  . DILATION AND CURETTAGE, DIAGNOSTIC / THERAPEUTIC      in office  . SALPINGOOPHORECTOMY Bilateral 03/17/2017   Procedure: SALPINGO OOPHORECTOMY;  Surgeon: Emily Filbert, MD;  Location: [facility];  Service: Gynecology;  Laterality: Bilateral;  . TUBAL LIGATION  1990's    There were no vitals filed for this visit.  Subjective Assessment - 07/09/17 0847    Subjective  No pain today , Back feels good.     Currently in  Pain?  No/denies                       [facility] Adult PT Treatment/Exercise - 07/09/17 0001      Lumbar Exercises: Stretches   Single Knee to Chest Stretch  30 seconds;2 reps    Piriformis Stretch  Right;30 seconds    Other Lumbar Stretch Exercise  prone knee flexion on RT gave RT back pain so stopped immediatley thendid gentle for 20 sec before soreness started. LT no back pain  PKF      Lumbar Exercises: Aerobic   Nustep  L5 LE 5 min      Lumbar Exercises: Supine   Pelvic Tilt  15 reps    Dead Bug  20 reps;Limitations    Dead Bug Limitations  broke down with slower movement with arms and legs and cued for awareness of contraction and position of pelvis. She did well       Large Ball Abdominal Isometric  10 reps    Large Ball Oblique Isometric  10 reps;Limitations RT/LT      Manual Therapy   Manual therapy comments  stretching lats and RT flank and Iastim to paraspinals and QL more  RT.  and STW to anterior hips             PT Education - 07/09/17 0933    Education provided  Yes    Education Details  lifting mechaincs and need to decr flex and twist as much as possible  to decr load to spine    Person(s) Educated  Patient    Methods  Explanation;Demonstration;Verbal cues    Comprehension  Returned demonstration;Verbalized understanding       PT Short Term Goals - 07/02/17 1048      PT SHORT TERM GOAL #1   Title  She will be independent with iniital HEp     Time  2    Period  Weeks    Status  Achieved      PT SHORT TERM GOAL #2   Title  Demo understanding  good posture for anteriolisthesis    Time  2    Period  Weeks    Status  Achieved        PT Long Term Goals - 07/02/17 1049      PT LONG TERM GOAL #1   Title  She was independent with all HEP issued.     Time  6    Period  Weeks    Status  Achieved      PT LONG TERM GOAL #2   Title  She will report pain decr in frequency by 50%     Baseline  40% decreased     Time  6    Period  Weeks     Status  Partially Met      PT LONG TERM GOAL #3   Title  She will report intensity decr 50% when flared up    Baseline  Pain intensity same , frequency less  and she reports able to manage pain    Time  6    Period  Weeks    Status  On-going            Plan - 07/09/17 0846    Clinical Impression Statement  due to chronicity of back pain though improved she may have episodes of pain. Hope these are mild and resolved in short periods after discharge She did well with abdominal setting with exercise    PT Treatment/Interventions  Moist Heat;Therapeutic exercise;Manual techniques;Passive range of motion;Patient/family education    PT Next Visit Plan  Core strength and side lye  stretch    PT Home Exercise Plan  PPT , Knee to chest and adduction-(pt request to disc.due to upper back spasms)- added 06/15/2017- quadruped cat camel, childs pose with laterals.  added 5/30- quadruped alt UE/LE raise, dead bug , side stretch  in sidlye , and arm openings    Consulted and Agree with Plan of Care  Patient       Patient will benefit from skilled therapeutic intervention in order to improve the following deficits and impairments:  Increased muscle spasms, Pain, Postural dysfunction, Decreased strength  Visit Diagnosis: Abnormal posture  Muscle spasm of back  Muscle weakness (generalized)     Problem List Patient Active Problem List   Diagnosis Date Noted  . HTN (hypertension) 05/14/2017  . Post-operative state 03/17/2017  . Fibroids 01/09/2017  . Back pain, thoracic 12/21/2014  . Anemia 12/20/2014  . Dyspnea 12/20/2014  . Heavy menses 12/20/2014    [name]  PT 07/09/2017, 10:18 AM  [facility] [address] [place], [place], [address]  Phone: [phone]   Fax:  [phone]  Name: Nina Hopkins MRN: [id number] Date of Birth: 09/23/1960

## 2017-07-10 ENCOUNTER — Inpatient Hospital Stay: Payer: No Typology Code available for payment source

## 2017-07-10 ENCOUNTER — Inpatient Hospital Stay: Payer: No Typology Code available for payment source | Attending: Obstetrics and Gynecology | Admitting: *Deleted

## 2017-07-10 ENCOUNTER — Other Ambulatory Visit: Payer: Self-pay

## 2017-07-10 VITALS — BP 120/70 | Ht 61.0 in | Wt 185.5 lb

## 2017-07-10 DIAGNOSIS — Z0000 Encounter for general adult medical examination without abnormal findings: Secondary | ICD-10-CM

## 2017-07-10 LAB — LIPID PANEL
Cholesterol: 207 mg/dL — ABNORMAL HIGH (ref 0–200)
HDL: 68 mg/dL (ref >40)
LDL CALC: 122 mg/dL — AB (ref 0–99)
Total CHOL/HDL Ratio: 3 RATIO
Triglycerides: 83 mg/dL (ref <150)
VLDL: 17 mg/dL (ref 0–40)

## 2017-07-10 LAB — HEMOGLOBIN A1C
HEMOGLOBIN A1C: 5.4 % (ref 4.8–5.6)
MEAN PLASMA GLUCOSE: 108.28 mg/dL

## 2017-07-10 NOTE — Addendum Note (Signed)
Addended by: Oliver Hum D on: 07/10/2017 10:25 AM   Modules accepted: Orders

## 2017-07-10 NOTE — Addendum Note (Signed)
Addended by: Oliver Hum D on: 07/10/2017 10:26 AM   Modules accepted: Orders

## 2017-07-10 NOTE — Progress Notes (Signed)
[facility] initial screening  Clinical Measurement:  Height:  61in. Weight: 185.5lb Blood Pressure: 118/74 Blood Pressure #2: 120/70  Fasting Labs Drawn Today, will review with patient when they result.  Medical History:  Patient states that she has not been diagnosed with high cholesterol, diabetes or heart disease. Patient states she has been diagnosed with high blood pressure.  Medications:  Patients states she is  taking  medications forhigh blood pressure. Patient states she is not taking medications for  high cholesterol, or diabetes.  She is not taking aspirin daily to prevent heart attack or stroke.    Blood pressure, self measurement:  Patients states she does  measure blood pressure at home.  Patient states she keeps a log of her blood pressure readings and shares these readings with a provider.  Nutrition:  Patient states she eats 1 cup of fruit and 4 cup of vegetables in an average day.  Patient states she does not eat fish regularly, she eats more than half a serving of whole grains daily. She drinks less than 36 ounces of beverages with added sugar weekly.  She is currently watching her sodium intake.  She has not had any drinks containing alcohol in the last seven days.    Physical activity:  Patient states that she gets 210 minutes of moderate exercise in a week.  She gets 0 minutes of vigorous exercise per week.    Smoking status:  Patient states she has never smoked and is not around any smokers.    Quality of life:  Patient states that she has had 0 bad physical days out of the last 30 days. In the last 2 weeks, she has had 0 days that she has felt down or depressed. She has had 0 days in the last 2 weeks that she has had little interest or pleasure in doing things.  Risk reduction and counseling:  Patient states she wants to  increase fruit and vegetable intake.  I encouraged her to continue with current exercise regimen and increase vegetable and fruit intake.  Navigation:  I  will notify patient of lab results.  Patient is aware of 2 more health coaching sessions and a follow up.

## 2017-07-14 ENCOUNTER — Ambulatory Visit: Payer: Self-pay

## 2017-07-14 DIAGNOSIS — M6281 Muscle weakness (generalized): Secondary | ICD-10-CM

## 2017-07-14 DIAGNOSIS — M62830 Muscle spasm of back: Secondary | ICD-10-CM

## 2017-07-14 DIAGNOSIS — R293 Abnormal posture: Secondary | ICD-10-CM

## 2017-07-14 MED FILL — AMLODIPINE BESYLATE 10 MG T: 10 | 30 days supply | Qty: 30 | Fill #1

## 2017-07-14 NOTE — Therapy (Signed)
[place], [place], [address] Phone: [phone]   Fax:  [phone]  Physical Therapy Treatment  Patient Details  Name: Nina Hopkins MRN: [id number] Date of Birth: 1960-03-24 Referring Provider: [name] NP   Encounter Date: 07/14/2017  PT End of Session - 07/14/17 1017    Visit Number  10    Number of Visits  12    Date for PT Re-Evaluation  07/24/17    Authorization Type  CFA    PT Start Time  3299    PT Stop Time  1110    PT Time Calculation (min)  55 min    Activity Tolerance  Patient tolerated treatment well    Behavior During Therapy  [facility] for tasks assessed/performed       Past Medical History:  Diagnosis Date  . Anemia   . Anxiety ~ 2008   no meds  . Dyspnea    hx - r/t anemia per patient  . Family history of adverse reaction to anesthesia    "my sister had a hard time waking up"  . History of blood transfusion 12/20/2014   related to anemia  . Hyperlipidemia   . Hypertension   . Missed ab    x 1 - no surgery required  . SVD (spontaneous vaginal delivery)    x 2    Past Surgical History:  Procedure Laterality Date  . ABDOMINAL HYSTERECTOMY N/A 03/17/2017   Procedure: HYSTERECTOMY ABDOMINAL;  Surgeon: Emily Filbert, MD;  Location: [facility];  Service: Gynecology;  Laterality: N/A;  . CERVIX LESION DESTRUCTION     cryro  . CYSTOSCOPY N/A 03/17/2017   Procedure: CYSTOSCOPY;  Surgeon: Emily Filbert, MD;  Location: [facility];  Service: Gynecology;  Laterality: N/A;  . DILATION AND CURETTAGE, DIAGNOSTIC / THERAPEUTIC      in office  . SALPINGOOPHORECTOMY Bilateral 03/17/2017   Procedure: SALPINGO OOPHORECTOMY;  Surgeon: Emily Filbert, MD;  Location: [facility];  Service: Gynecology;  Laterality: Bilateral;  . TUBAL LIGATION  1990's    There were no vitals filed for this visit.  Subjective Assessment - 07/14/17 1022    Subjective  Pain today 2.5/10    Currently in Pain?  Yes    Pain Score  -- 2.5    Pain Location  Back    Pain Orientation  Right    Pain Descriptors / Indicators  Aching;Spasm    Pain Type  Chronic pain    Pain Onset  More than a month ago    Pain Frequency  Intermittent    Aggravating Factors   varies    Pain Relieving Factors  meds     Multiple Pain Sites  No                       [facility] Adult PT Treatment/Exercise - 07/14/17 0001      Lumbar Exercises: Stretches   Other Lumbar Stretch Exercise  side bend stretch over roll x 60 sec then with manual stretch      Lumbar Exercises: Aerobic   Nustep  L5 LE 5 min      Lumbar Exercises: Supine   Pelvic Tilt  20 reps;5 seconds    Pelvic Tilt Limitations  then 10 with partial hip lift keeping back flexed    Other Supine Lumbar Exercises  clams x 15 with ab set.  then 10 each leg with ab set.  Moist Heat Therapy   Number Minutes Moist Heat  12 Minutes    Moist Heat Location  Lumbar Spine      Manual Therapy   Manual Therapy  Soft tissue mobilization    Soft tissue mobilization  IASTIM to RT QL and paraspinals then hip rotation with pressure to gluteals with tennis ball followed by child's pose stretch. 60 sec then LT side stretch x 60 60 after childs pose               PT Short Term Goals - 07/02/17 1048      PT SHORT TERM GOAL #1   Title  She will be independent with iniital HEp     Time  2    Period  Weeks    Status  Achieved      PT SHORT TERM GOAL #2   Title  Demo understanding  good posture for anteriolisthesis    Time  2    Period  Weeks    Status  Achieved        PT Long Term Goals - 07/02/17 1049      PT LONG TERM GOAL #1   Title  She was independent with all HEP issued.     Time  6    Period  Weeks    Status  Achieved      PT LONG TERM GOAL #2   Title  She will report pain decr in frequency by 50%     Baseline  40% decreased     Time  6    Period  Weeks    Status  Partially Met      PT LONG TERM GOAL #3   Title  She will report  intensity decr 50% when flared up    Baseline  Pain intensity same , frequency less  and she reports able to manage pain    Time  6    Period  Weeks    Status  On-going            Plan - 07/14/17 1017    Clinical Impression Statement  Pain again today but mild She did not stretch at home to try to ease pain.   No worse with exercise today.   Good abdomnial control.      PT Treatment/Interventions  Moist Heat;Therapeutic exercise;Manual techniques;Passive range of motion;Patient/family education    PT Home Exercise Plan  PPT , Knee to chest and adduction-(pt request to disc.due to upper back spasms)- added 06/15/2017- quadruped cat camel, childs pose with laterals.  added 5/30- quadruped alt UE/LE raise, dead bug , side stretch  in sidlye , and arm openings    Consulted and Agree with Plan of Care  Patient       Patient will benefit from skilled therapeutic intervention in order to improve the following deficits and impairments:  Increased muscle spasms, Pain, Postural dysfunction, Decreased strength  Visit Diagnosis: Abnormal posture  Muscle spasm of back  Muscle weakness (generalized)     Problem List Patient Active Problem List   Diagnosis Date Noted  . HTN (hypertension) 05/14/2017  . Post-operative state 03/17/2017  . Fibroids 01/09/2017  . Back pain, thoracic 12/21/2014  . Anemia 12/20/2014  . Dyspnea 12/20/2014  . Heavy menses 12/20/2014    [name]  PT 07/14/2017, 10:53 AM  [facility] [address] [place], [place], [address] Phone: [phone]   Fax:  [phone]  Name: Nina Hopkins  MRN: [id number] Date of Birth: [birth year]

## 2017-07-15 ENCOUNTER — Telehealth (HOSPITAL_COMMUNITY): Payer: Self-pay | Admitting: *Deleted

## 2017-07-15 NOTE — Telephone Encounter (Signed)
  Health Coaching 2  Labs- cholesterol 207, HDL 68, LDL 122, Triglycerides 83, A1C 5.4, glucose 108.28 Patient is aware and understands these lab results.  Goals- Patient states she will continue to increase her exercise routine.  She states she will continue to increase fruit, vegetable and water consumption.  Navigation:   Patient is aware of 1 more health coaching sessions and a follow up.

## 2017-07-16 ENCOUNTER — Ambulatory Visit: Payer: Self-pay

## 2017-07-16 DIAGNOSIS — M62830 Muscle spasm of back: Secondary | ICD-10-CM

## 2017-07-16 DIAGNOSIS — R293 Abnormal posture: Secondary | ICD-10-CM

## 2017-07-16 DIAGNOSIS — M6281 Muscle weakness (generalized): Secondary | ICD-10-CM

## 2017-07-16 LAB — FECAL OCCULT BLOOD, IMMUNOCHEMICAL: Fecal Occult Bld: NEGATIVE

## 2017-07-16 NOTE — Therapy (Signed)
[place], [place], [address] Phone: [phone]   Fax:  [phone]  Physical Therapy Treatment  Patient Details  Name: Nina Hopkins MRN: [id number] Date of Birth: 05/03/1960 Referring Provider: [name] NP   Encounter Date: 07/16/2017  PT End of Session - 07/16/17 0842    Visit Number  11    Number of Visits  13    Date for PT Re-Evaluation  07/24/17    Authorization Type  CFA    PT Start Time  0840    PT Stop Time  0928    PT Time Calculation (min)  48 min    Activity Tolerance  Patient tolerated treatment well    Behavior During Therapy  [facility] for tasks assessed/performed       Past Medical History:  Diagnosis Date  . Anemia   . Anxiety ~ 2008   no meds  . Dyspnea    hx - r/t anemia per patient  . Family history of adverse reaction to anesthesia    "my sister had a hard time waking up"  . History of blood transfusion 12/20/2014   related to anemia  . Hyperlipidemia   . Hypertension   . Missed ab    x 1 - no surgery required  . SVD (spontaneous vaginal delivery)    x 2    Past Surgical History:  Procedure Laterality Date  . ABDOMINAL HYSTERECTOMY N/A 03/17/2017   Procedure: HYSTERECTOMY ABDOMINAL;  Surgeon: Emily Filbert, MD;  Location: [facility];  Service: Gynecology;  Laterality: N/A;  . CERVIX LESION DESTRUCTION     cryro  . CYSTOSCOPY N/A 03/17/2017   Procedure: CYSTOSCOPY;  Surgeon: Emily Filbert, MD;  Location: [facility];  Service: Gynecology;  Laterality: N/A;  . DILATION AND CURETTAGE, DIAGNOSTIC / THERAPEUTIC      in office  . SALPINGOOPHORECTOMY Bilateral 03/17/2017   Procedure: SALPINGO OOPHORECTOMY;  Surgeon: Emily Filbert, MD;  Location: [facility];  Service: Gynecology;  Laterality: Bilateral;  . TUBAL LIGATION  1990's    There were no vitals filed for this visit.  Subjective Assessment - 07/16/17 0849    Subjective  No back pain today. We have noodles at work so  will pick one up    Currently in Pain?  No/denies                       [facility] Adult PT Treatment/Exercise - 07/16/17 0001      Self-Care   Self-Care  Other Self-Care Comments    Other Self-Care Comments   use of small foam roller for thoracic spine stretch into extension.       Lumbar Exercises: Stretches   Other Lumbar Stretch Exercise  thoracic ext with towel roll parappep to spine and 90 degrees to spine      Lumbar Exercises: Aerobic   Nustep  L5 LE 6 min      Lumbar Exercises: Supine   Pelvic Tilt  20 reps;5 seconds    Heel Slides  -- 12 reps    Dead Bug  20 reps;Limitations    Other Supine Lumbar Exercises  clam with single leg drop x 15 RT/LT      Lumbar Exercises: Quadruped   Madcat/Old Horse  10 reps    Opposite Arm/Leg Raise  Right arm/Left leg;Left arm/Right leg;10 reps    Other Quadruped Lumbar Exercises  childs pose with lateral  30 sec each  PT Short Term Goals - 07/02/17 1048      PT SHORT TERM GOAL #1   Title  She will be independent with iniital HEp     Time  2    Period  Weeks    Status  Achieved      PT SHORT TERM GOAL #2   Title  Demo understanding  good posture for anteriolisthesis    Time  2    Period  Weeks    Status  Achieved        PT Long Term Goals - 07/02/17 1049      PT LONG TERM GOAL #1   Title  She was independent with all HEP issued.     Time  6    Period  Weeks    Status  Achieved      PT LONG TERM GOAL #2   Title  She will report pain decr in frequency by 50%     Baseline  40% decreased     Time  6    Period  Weeks    Status  Partially Met      PT LONG TERM GOAL #3   Title  She will report intensity decr 50% when flared up    Baseline  Pain intensity same , frequency less  and she reports able to manage pain    Time  6    Period  Weeks    Status  On-going            Plan - 07/16/17 0842    Clinical Impression Statement  No back pain today. doing well with exercise .   Need to advance HEP then discharge next visit.     PT Treatment/Interventions  Moist Heat;Therapeutic exercise;Manual techniques;Passive range of motion;Patient/family education    PT Next Visit Plan  Core strength , advance then discharge next week    PT Home Exercise Plan  PPT , Knee to chest and adduction-(pt request to disc.due to upper back spasms)- added 06/15/2017- quadruped cat camel, childs pose with laterals.  added 5/30- quadruped alt UE/LE raise, dead bug , side stretch  in sidlye , and arm openings  Lumbar stab L1 all 4 exer    Consulted and Agree with Plan of Care  Patient       Patient will benefit from skilled therapeutic intervention in order to improve the following deficits and impairments:  Increased muscle spasms, Pain, Postural dysfunction, Decreased strength  Visit Diagnosis: Abnormal posture  Muscle spasm of back  Muscle weakness (generalized)     Problem List Patient Active Problem List   Diagnosis Date Noted  . HTN (hypertension) 05/14/2017  . Post-operative state 03/17/2017  . Fibroids 01/09/2017  . Back pain, thoracic 12/21/2014  . Anemia 12/20/2014  . Dyspnea 12/20/2014  . Heavy menses 12/20/2014    Chasse, Stephen M  PT 07/16/2017, 9:32 AM  [facility] [address] [place], [place], [address] Phone: [phone]   Fax:  [phone]  Name: Nina Hopkins MRN: [id number] Date of Birth: 07/30/1960   

## 2017-07-20 ENCOUNTER — Encounter (HOSPITAL_COMMUNITY): Payer: Self-pay | Admitting: *Deleted

## 2017-07-20 ENCOUNTER — Ambulatory Visit (HOSPITAL_COMMUNITY)
Admission: EM | Admit: 2017-07-20 | Discharge: 2017-07-20 | Disposition: A | Discharge status: Home / Self Care | Payer: No Typology Code available for payment source | Attending: Internal Medicine | Admitting: Internal Medicine

## 2017-07-20 ENCOUNTER — Encounter (HOSPITAL_COMMUNITY): Payer: Self-pay | Admitting: Emergency Medicine

## 2017-07-20 ENCOUNTER — Encounter (HOSPITAL_COMMUNITY): Payer: Self-pay

## 2017-07-20 DIAGNOSIS — K0889 Other specified disorders of teeth and supporting structures: Secondary | ICD-10-CM

## 2017-07-20 MED ORDER — CHLORHEXIDINE GLUCONATE 0.12 % MT SOLN: 15.0000 mL | Freq: Two times a day (BID) | OROMUCOSAL | 0 refills | Status: DC

## 2017-07-20 MED ORDER — AMOXICILLIN-POT CLAVULANATE 875-125 MG PO TABS: 1.0000 | ORAL_TABLET | Freq: Two times a day (BID) | ORAL | 0 refills | Status: DC

## 2017-07-20 MED ORDER — MELOXICAM 15 MG PO TABS: 15.0000 mg | ORAL_TABLET | Freq: Every day | ORAL | 0 refills | Status: DC

## 2017-07-20 MED FILL — MELOXICAM 15 MG TABLET: 15 | 15 days supply | Qty: 15 | Fill #0

## 2017-07-20 MED FILL — AMOX-CLAV 875-125 MG TABLET: 875-125 | 7 days supply | Qty: 14 | Fill #0

## 2017-07-20 MED FILL — CHLORHEXIDINE 0.12% RINSE: 0.12 | 16 days supply | Qty: 473 | Fill #0

## 2017-07-20 NOTE — ED Triage Notes (Signed)
Pt here for left sided dental pain 

## 2017-07-20 NOTE — Discharge Instructions (Addendum)
Start Augmentin as directed for dental infection. Start Mobic. Do not take ibuprofen (motrin/advil)/ naproxen (aleve) while on mobic. Peridex for dental hygiene. Follow up with dentist for further treatment and evaluation. If experiencing swelling of the throat, trouble breathing, trouble swallowing, leaning forward to breath, drooling, go to the emergency department for further evaluation.
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
## 2017-07-21 ENCOUNTER — Ambulatory Visit: Payer: Self-pay | Admitting: Physical Therapy

## 2017-07-23 ENCOUNTER — Ambulatory Visit: Payer: Self-pay

## 2017-07-23 DIAGNOSIS — M6281 Muscle weakness (generalized): Secondary | ICD-10-CM

## 2017-07-23 DIAGNOSIS — M62830 Muscle spasm of back: Secondary | ICD-10-CM

## 2017-07-23 DIAGNOSIS — R293 Abnormal posture: Secondary | ICD-10-CM

## 2017-07-23 NOTE — Therapy (Signed)
[place], [place], [address] Phone: [phone]   Fax:  [phone]  Physical Therapy Treatment/Discharge  Patient Details  Name: Nina Hopkins MRN: [id number] Date of Birth: [birth year] Referring Provider: [name] NP   Encounter Date: 07/23/2017  PT End of Session - 07/23/17 0921    Visit Number  12    Number of Visits  13    Date for PT Re-Evaluation  07/24/17    Authorization Type  CFA    PT Start Time  0916    PT Stop Time  1000    PT Time Calculation (min)  44 min    Activity Tolerance  Patient tolerated treatment well    Behavior During Therapy  [facility] for tasks assessed/performed       Past Medical History:  Diagnosis Date  . Anemia   . Anxiety ~ 2008   no meds  . Dyspnea    hx - r/t anemia per patient  . Family history of adverse reaction to anesthesia    "my sister had a hard time waking up"  . History of blood transfusion 12/20/2014   related to anemia  . Hyperlipidemia   . Hypertension   . Missed ab    x 1 - no surgery required  . SVD (spontaneous vaginal delivery)    x 2    Past Surgical History:  Procedure Laterality Date  . ABDOMINAL HYSTERECTOMY N/A 03/17/2017   Procedure: HYSTERECTOMY ABDOMINAL;  Surgeon: Emily Filbert, MD;  Location: [facility];  Service: Gynecology;  Laterality: N/A;  . CERVIX LESION DESTRUCTION     cryro  . CYSTOSCOPY N/A 03/17/2017   Procedure: CYSTOSCOPY;  Surgeon: Emily Filbert, MD;  Location: [facility];  Service: Gynecology;  Laterality: N/A;  . DILATION AND CURETTAGE, DIAGNOSTIC / THERAPEUTIC      in office  . SALPINGOOPHORECTOMY Bilateral 03/17/2017   Procedure: SALPINGO OOPHORECTOMY;  Surgeon: Emily Filbert, MD;  Location: [facility];  Service: Gynecology;  Laterality: Bilateral;  . TUBAL LIGATION  1990's    There were no vitals filed for this visit.  Subjective Assessment - 07/23/17 0921    Subjective  Back good today . Missed last  appointment due to dental pain .     Currently in Pain?  No/denies         [facility] PT Assessment - 07/23/17 0001      AROM   Lumbar Flexion  55    Lumbar Extension  25    Lumbar - Right Side Bend  15    Lumbar - Left Side Bend  15                   [facility] Adult PT Treatment/Exercise - 07/23/17 0001      Lumbar Exercises: Stretches   Other Lumbar Stretch Exercise  She reported back sore with this. Encouraged her to continue at least with towel roll  to start as she reported she did feel bette the next day.       Lumbar Exercises: Aerobic   Nustep  L5 LE 6 min      Lumbar Exercises: Supine   Pelvic Tilt  10 seconds;15 reps    Pelvic Tilt Limitations  done with part hip lift    Heel Slides  10 reps    Dead Bug  20 reps;Limitations    Other Supine Lumbar Exercises  clam with single leg drop x 20 RT/LT  Lumbar Exercises: Quadruped   Madcat/Old Horse  10 reps    Opposite Arm/Leg Raise  Right arm/Left leg;Left arm/Right leg;10 reps;Limitations    Opposite Arm/Leg Raise Limitations  we worked on RT leg reach to limite back extension     Other Quadruped Lumbar Exercises  childs pose        We spent much of session discussing progression and possible modification of exercise to limit pain        PT Short Term Goals - 07/02/17 1048      PT SHORT TERM GOAL #1   Title  She will be independent with iniital HEp     Time  2    Period  Weeks    Status  Achieved      PT SHORT TERM GOAL #2   Title  Demo understanding  good posture for anteriolisthesis    Time  2    Period  Weeks    Status  Achieved        PT Long Term Goals - 07/23/17 1040      PT LONG TERM GOAL #1   Title  She was independent with all HEP issued.     Status  Achieved      PT LONG TERM GOAL #2   Title  She will report pain decr in frequency by 50%     Status  Achieved      PT LONG TERM GOAL #3   Title  She will report intensity decr 50% when flared up    Status  Partially Met             Plan - 07/23/17 0922    Clinical Impression Statement  Continues with intermitan RT back pain but more time without pain.  Discussed progression and modification of HEp to stop any flare ups.  Issued some basisc balance exercises she was supervised and appeared safe to do at home,         PT Treatment/Interventions  Moist Heat;Therapeutic exercise;Manual techniques;Passive range of motion;Patient/family education    PT Next Visit Plan  Discharge with HEP    PT Home Exercise Plan  PPT , Knee to chest and adduction-(pt request to disc.due to upper back spasms)- added 06/15/2017- quadruped cat camel, childs pose with laterals.  added 5/30- quadruped alt UE/LE raise, dead bug , side stretch  in sidlye , and arm openings  Lumbar stab L1 all 4 exer,  basic standing balance exercises    Consulted and Agree with Plan of Care  Patient       Patient will benefit from skilled therapeutic intervention in order to improve the following deficits and impairments:  Increased muscle spasms, Pain, Postural dysfunction, Decreased strength  Visit Diagnosis: Abnormal posture  Muscle spasm of back  Muscle weakness (generalized)     Problem List Patient Active Problem List   Diagnosis Date Noted  . HTN (hypertension) 05/14/2017  . Post-operative state 03/17/2017  . Fibroids 01/09/2017  . Back pain, thoracic 12/21/2014  . Anemia 12/20/2014  . Dyspnea 12/20/2014  . Heavy menses 12/20/2014    [name]  PT 07/23/2017, 10:45 AM  [facility] [address] [place], [place], [address] Phone: [phone]   Fax:  [phone]  Name: Nina Hopkins MRN: [id number] Date of Birth: 1960/10/17  PHYSICAL THERAPY DISCHARGE SUMMARY  Visits from Start of Care: 12  Current functional level related to goals / functional outcomes: See above   Remaining deficits: See  above   Education / Equipment: HEP Plan: Patient agrees to  discharge.  Patient goals were partially met. Patient is being discharged due to                                                     ?????   MAX benefit fro PT at this time Needs to be consistent with HEP

## 2017-07-28 ENCOUNTER — Other Ambulatory Visit: Payer: Self-pay | Admitting: Physician Assistant

## 2017-07-29 ENCOUNTER — Ambulatory Visit: Payer: Self-pay | Attending: Family Medicine

## 2017-07-31 ENCOUNTER — Ambulatory Visit: Payer: Self-pay | Attending: Nurse Practitioner | Admitting: Physician Assistant

## 2017-07-31 ENCOUNTER — Other Ambulatory Visit: Payer: Self-pay

## 2017-07-31 VITALS — BP 126/86 | HR 84 | Temp 98.6°F | Ht 61.0 in | Wt 186.0 lb

## 2017-07-31 DIAGNOSIS — E785 Hyperlipidemia, unspecified: Secondary | ICD-10-CM | POA: Insufficient documentation

## 2017-07-31 DIAGNOSIS — K0889 Other specified disorders of teeth and supporting structures: Secondary | ICD-10-CM

## 2017-07-31 DIAGNOSIS — K047 Periapical abscess without sinus: Secondary | ICD-10-CM | POA: Insufficient documentation

## 2017-07-31 DIAGNOSIS — B379 Candidiasis, unspecified: Secondary | ICD-10-CM | POA: Insufficient documentation

## 2017-07-31 DIAGNOSIS — I10 Essential (primary) hypertension: Secondary | ICD-10-CM | POA: Insufficient documentation

## 2017-07-31 DIAGNOSIS — K089 Disorder of teeth and supporting structures, unspecified: Secondary | ICD-10-CM

## 2017-07-31 DIAGNOSIS — Z79899 Other long term (current) drug therapy: Secondary | ICD-10-CM | POA: Insufficient documentation

## 2017-07-31 MED ORDER — FLUCONAZOLE 150 MG PO TABS: 150.0000 mg | ORAL_TABLET | Freq: Once | ORAL | 0 refills | Status: AC

## 2017-07-31 MED ORDER — AMLODIPINE BESYLATE 10 MG PO TABS: 10.0000 mg | ORAL_TABLET | ORAL | 5 refills | Status: DC

## 2017-07-31 MED ORDER — HYDROCHLOROTHIAZIDE 25 MG PO TABS: 25.0000 mg | ORAL_TABLET | Freq: Every day | ORAL | 5 refills | Status: DC

## 2017-07-31 MED FILL — HYDROCHLOROTHIAZIDE 25 MG T: 25 | 30 days supply | Qty: 30 | Fill #0

## 2017-07-31 MED FILL — FLUCONAZOLE 150 MG TABS: 150 | 1 days supply | Qty: 1 | Fill #0

## 2017-07-31 NOTE — Progress Notes (Signed)
Pt needs refills of HCTZ Pt will run out of amlodipine [place] at the beach in two [place]. Was told it is too soon for refill. Wants to know if she will be ok for a few days or can the refill be done today

## 2017-07-31 NOTE — Patient Instructions (Signed)
Nice to meet you! Safe travels!

## 2017-07-31 NOTE — Progress Notes (Signed)
Chief Complaint: ED follow up  Subjective: This is a pleasant 57 year old female with a history of hypertension and hyperlipidemia who is here for emergency department follow-up.  On 07/20/2017 she presented with 3 to 4 days of left upper dental pain.  She knew that she had a broken tooth and has been trying to get to the dentist.  She had been treating herself empirically with anti-inflammatories and oral jails.  It got to the point where she started swelling in her left face and went to the emergency department for further assessment.  She was treated with Mobic, Augmentin and Peridex swish.  She has had improvement in her pain.  She had improvement in the swelling.  She completed antibiotics.  She believes the antibiotic caused a yeast infection.  She is having a clear itchy discharge.  No odor.  She also is requesting refills on her chronic medications.  She is headed to [employer] and believes that she will run out before she gets back.   ROS:  GEN: denies fever or chills, denies change in weight Skin: denies lesions or rashes HEENT: denies headache, earache, epistaxis, sore throat, or neck pain; +swelling and dental pain   Objective:  Vitals:   07/31/17 1500  BP: 126/86  Pulse: 84  Temp: 98.6 F (37 C)  TempSrc: Oral  SpO2: 98%  Weight: 186 lb (84.4 kg)  Height: 5\' 1"  (1.549 m)    Physical Exam:  General: in no acute distress. HEENT: no pallor, no icterus, moist oral mucosa, no JVD, no lymphadenopathy; benign today, poor dentition, broken Left upper molar Skin-warm and dry   Medications: Prior to Admission medications   Medication Sig Start Date End Date Taking? Authorizing Provider  amLODipine (NORVASC) 10 MG tablet Take 1 tablet (10 mg total) by mouth 1 day or 1 dose. 07/31/17  Yes Ena Dawley, Marchia Diguglielmo S, PA-C  hydrochlorothiazide (HYDRODIURIL) 25 MG tablet Take 1 tablet (25 mg total) by mouth daily. 07/31/17  Yes Ena Dawley, [place] Landgrebe S, PA-C  ibuprofen (ADVIL,MOTRIN) 600 MG tablet  Take 1 tablet (600 mg total) by mouth every 8 (eight) hours as needed for mild pain. 02/16/17  Yes Hairston, Maylon Peppers, FNP  amoxicillin-clavulanate (AUGMENTIN) 875-125 MG tablet Take 1 tablet by mouth 2 (two) times daily.    [provider]  chlorhexidine (PERIDEX) 0.12 % solution Use as directed 15 mLs in the mouth or throat 2 (two) times daily. Patient not taking: Reported on 07/31/2017 07/20/17   Ok Edwards, PA-C  fluconazole (DIFLUCAN) 150 MG tablet Take 1 tablet (150 mg total) by mouth once for 1 dose. 07/31/17 07/31/17  Brayton Caves, PA-C  meloxicam (MOBIC) 15 MG tablet Take 1 tablet (15 mg total) by mouth daily. Patient not taking: Reported on 07/31/2017 07/20/17   Ok Edwards, PA-C  potassium chloride SA (K-DUR,KLOR-CON) 20 MEQ tablet Take 1 tablet (20 mEq total) by mouth 2 (two) times daily. 03/10/17 04/09/17  Emily Filbert, MD  pravastatin (PRAVACHOL) 20 MG tablet Take 1 tablet (20 mg total) by mouth daily. Patient not taking: Reported on 06/25/2017 03/05/17   Alfonse Spruce, FNP  Vitamin D, Ergocalciferol, (DRISDOL) 50000 units CAPS capsule Take 1 capsule (50,000 Units total) by mouth every 7 (seven) days. Patient not taking: Reported on 06/25/2017 01/16/17   Argentina Donovan, PA-C    Assessment: Dental Pain/dental abscess Yeast Infection  Plan: Dentist referral Meds refilled Diflucan  Follow up:as scheduled  The patient was given clear instructions to go to  ER or return to medical center if symptoms don't improve, worsen or new problems develop. The patient verbalized understanding. The patient was told to call to get lab results if they haven't heard anything in the next week.   This note has been created with Surveyor, quantity. Any transcriptional errors are unintentional.   Zettie Pho, PA-C 07/31/2017, 3:18 PM

## 2017-08-27 MED FILL — HYDROCHLOROTHIAZIDE 25 MG T: 25 | 30 days supply | Qty: 30 | Fill #1

## 2017-09-01 MED FILL — AMLODIPINE BESYLATE 10 MG T: 10 | 30 days supply | Qty: 30 | Fill #2

## 2017-09-07 ENCOUNTER — Ambulatory Visit: Payer: Self-pay | Attending: Nurse Practitioner | Admitting: Nurse Practitioner

## 2017-09-07 ENCOUNTER — Encounter: Payer: Self-pay | Admitting: Nurse Practitioner

## 2017-09-07 VITALS — BP 114/79 | HR 77 | Temp 99.4°F | Ht 61.0 in | Wt 184.4 lb

## 2017-09-07 DIAGNOSIS — Z9889 Other specified postprocedural states: Secondary | ICD-10-CM | POA: Insufficient documentation

## 2017-09-07 DIAGNOSIS — Z8249 Family history of ischemic heart disease and other diseases of the circulatory system: Secondary | ICD-10-CM | POA: Insufficient documentation

## 2017-09-07 DIAGNOSIS — Z833 Family history of diabetes mellitus: Secondary | ICD-10-CM | POA: Insufficient documentation

## 2017-09-07 DIAGNOSIS — Z79899 Other long term (current) drug therapy: Secondary | ICD-10-CM | POA: Insufficient documentation

## 2017-09-07 DIAGNOSIS — Z90710 Acquired absence of both cervix and uterus: Secondary | ICD-10-CM | POA: Insufficient documentation

## 2017-09-07 DIAGNOSIS — E785 Hyperlipidemia, unspecified: Secondary | ICD-10-CM | POA: Insufficient documentation

## 2017-09-07 DIAGNOSIS — I10 Essential (primary) hypertension: Secondary | ICD-10-CM

## 2017-09-07 DIAGNOSIS — Z801 Family history of malignant neoplasm of trachea, bronchus and lung: Secondary | ICD-10-CM | POA: Insufficient documentation

## 2017-09-07 DIAGNOSIS — Z9851 Tubal ligation status: Secondary | ICD-10-CM | POA: Insufficient documentation

## 2017-09-07 DIAGNOSIS — F419 Anxiety disorder, unspecified: Secondary | ICD-10-CM

## 2017-09-07 MED ORDER — HYDROXYZINE HCL 50 MG PO TABS: 50.0000 mg | ORAL_TABLET | Freq: Three times a day (TID) | ORAL | 1 refills | Status: AC | PRN

## 2017-09-07 NOTE — Progress Notes (Signed)
he

## 2017-09-07 NOTE — Patient Instructions (Signed)
Living With Anxiety After being diagnosed with an anxiety disorder, you may be relieved to know why you have felt or behaved a certain way. It is natural to also feel overwhelmed about the treatment ahead and what it will mean for your life. With care and support, you can manage this condition and recover from it. How to cope with anxiety Dealing with stress Stress is your body's reaction to life changes and events, both good and bad. Stress can last just a few hours or it can be ongoing. Stress can play a major role in anxiety, so it is important to learn both how to cope with stress and how to think about it differently. Talk with your health care provider or a counselor to learn more about stress reduction. He or she may suggest some stress reduction techniques, such as:  Music therapy. This can include creating or listening to music that you enjoy and that inspires you.  Mindfulness-based meditation. This involves being aware of your normal breaths, rather than trying to control your breathing. It can be done while sitting or walking.  Centering prayer. This is a kind of meditation that involves focusing on a word, phrase, or sacred image that is meaningful to you and that brings you peace.  Deep breathing. To do this, expand your stomach and inhale slowly through your nose. Hold your breath for 3-5 seconds. Then exhale slowly, allowing your stomach muscles to relax.  Self-talk. This is a skill where you identify thought patterns that lead to anxiety reactions and correct those thoughts.  Muscle relaxation. This involves tensing muscles then relaxing them.  Choose a stress reduction technique that fits your lifestyle and personality. Stress reduction techniques take time and practice. Set aside 5-15 minutes a day to do them. Therapists can offer training in these techniques. The training may be covered by some insurance plans. Other things you can do to manage stress include:  Keeping a  stress diary. This can help you learn what triggers your stress and ways to control your response.  Thinking about how you respond to certain situations. You may not be able to control everything, but you can control your reaction.  Making time for activities that help you relax, and not feeling guilty about spending your time in this way.  Therapy combined with coping and stress-reduction skills provides the best chance for successful treatment. Medicines Medicines can help ease symptoms. Medicines for anxiety include:  Anti-anxiety drugs.  Antidepressants.  Beta-blockers.  Medicines may be used as the main treatment for anxiety disorder, along with therapy, or if other treatments are not working. Medicines should be prescribed by a health care provider. Relationships Relationships can play a big part in helping you recover. Try to spend more time connecting with trusted friends and family members. Consider going to couples counseling, taking family education classes, or going to family therapy. Therapy can help you and others better understand the condition. How to recognize changes in your condition Everyone has a different response to treatment for anxiety. Recovery from anxiety happens when symptoms decrease and stop interfering with your daily activities at home or work. This may mean that you will start to:  Have better concentration and focus.  Sleep better.  Be less irritable.  Have more energy.  Have improved memory.  It is important to recognize when your condition is getting worse. Contact your health care provider if your symptoms interfere with home or work and you do not feel like your condition   is improving. Where to find help and support: You can get help and support from these sources:  Self-help groups.  Online and [employer].  A trusted spiritual leader.  Couples counseling.  Family education classes.  Family therapy.  Follow these  instructions at home:  Eat a healthy diet that includes plenty of vegetables, fruits, whole grains, low-fat dairy products, and lean protein. Do not eat a lot of foods that are high in solid fats, added sugars, or salt.  Exercise. Most adults should do the following: ? Exercise for at least 150 minutes each week. The exercise should increase your heart rate and make you sweat (moderate-intensity exercise). ? Strengthening exercises at least twice a week.  Cut down on caffeine, tobacco, alcohol, and other potentially harmful substances.  Get the right amount and quality of sleep. Most adults need 7-9 hours of sleep each night.  Make choices that simplify your life.  Take over-the-counter and prescription medicines only as told by your health care provider.  Avoid caffeine, alcohol, and certain over-the-counter cold medicines. These may make you feel worse. Ask your pharmacist which medicines to avoid.  Keep all follow-up visits as told by your health care provider. This is important. Questions to ask your health care provider  Would I benefit from therapy?  How often should I follow up with a health care provider?  How long do I need to take medicine?  Are there any long-term side effects of my medicine?  Are there any alternatives to taking medicine? Contact a health care provider if:  You have a hard time staying focused or finishing daily tasks.  You spend many hours a day feeling worried about everyday life.  You become exhausted by worry.  You start to have headaches, feel tense, or have nausea.  You urinate more than normal.  You have diarrhea. Get help right away if:  You have a racing heart and shortness of breath.  You have thoughts of hurting yourself or others. If you ever feel like you may hurt yourself or others, or have thoughts about taking your own life, get help right away. You can go to your nearest emergency department or call:  Your local emergency  services (911 in the [place]  A suicide crisis helpline, such as the [place] at (701)256-7167. This is open 24-hours a day.  Summary  Taking steps to deal with stress can help calm you.  Medicines cannot cure anxiety disorders, but they can help ease symptoms.  Family, friends, and partners can play a big part in helping you recover from an anxiety disorder. This information is not intended to replace advice given to you by your health care provider. Make sure you discuss any questions you have with your health care provider. Document Released: 01/15/2016 Document Revised: 01/15/2016 Document Reviewed: 01/15/2016 Elsevier Interactive Patient Education  2018 [employer].  Panic Attacks Panic attacks are sudden, short feelings of great fear or discomfort. You may have them for no reason when you are relaxed, when you are uneasy (anxious), or when you are sleeping. Follow these instructions at home:  Take all your medicines as told.  Check with your doctor before starting new medicines.  Keep all doctor visits. Contact a doctor if:  You are not able to take your medicines as told.  Your symptoms do not get better.  Your symptoms get worse. Get help right away if:  Your attacks seem different than your normal attacks.  You have thoughts  about hurting yourself or others.  You take panic attack medicine and you have a side effect. This information is not intended to replace advice given to you by your health care provider. Make sure you discuss any questions you have with your health care provider. Document Released: 02/22/2010 Document Revised: 06/28/2015 Document Reviewed: 09/03/2012 Elsevier Interactive Patient Education  2017 [employer].

## 2017-09-07 NOTE — Progress Notes (Signed)
Assessment & Plan:  Nina Hopkins was seen today for follow-up.  Diagnoses and all orders for this visit:  Essential hypertension Continue all antihypertensives as prescribed.  Remember to bring in your blood pressure log with you for your follow up appointment.  DASH/Mediterranean Diets are healthier choices for HTN.   Anxiety -     hydrOXYzine (ATARAX/VISTARIL) 50 MG tablet; Take 1 tablet (50 mg total) by mouth 3 (three) times daily as needed.    Patient has been counseled on age-appropriate routine health concerns for screening and prevention. These are reviewed and up-to-date. Referrals have been placed accordingly. Immunizations are up-to-date or declined.    Subjective:   Chief Complaint  Patient presents with  . Follow-up    Pt. is here to follow-up on hypertension. Pt. stated she lost her Amlodipine medication for her blood pressure.    HPI Nina Hopkins 57 y.o. female presents to office today for follow up of HTN. She also endorses anxiety. She took lexapro in the past but would like to be prescribed a medication that she does not have to take everyday. She denies any suicidal ideation.   CHRONIC HYPERTENSION Disease Monitoring  Blood pressure range BP Readings from Last 3 Encounters:  09/07/17 114/79  07/31/17 126/86  07/20/17 123/87    Chest pain: no   Dyspnea: no   Claudication: no  Medication compliance: yes; taking amlodipine 10mg  and HCTZ 25mg  daily Medication Side Effects  Lightheadedness: no   Urinary frequency: no   Edema: no   Impotence: no  Preventitive Healthcare:  Exercise: no   Diet Pattern: salt added to cooking and diet: general  Salt Restriction:  no   Anxiety: Patient complains of anxiety disorder and panic attacks.  She has the following symptoms: feelings of losing control, irritable, racing thoughts. Onset of symptoms was approximately several years ago, gradually worsening since that time. She denies current suicidal and homicidal  ideation. Family history significant for no psychiatric illness.Possible organic causes contributing are: none. Risk factors: negative life event family stressors Previous treatment includes Lexapro and none.  She complains of the following side effects from the treatment: none.  Review of Systems  Constitutional: Negative for fever, malaise/fatigue and weight loss.  HENT: Negative.  Negative for nosebleeds.   Eyes: Negative.  Negative for blurred vision, double vision and photophobia.  Respiratory: Negative.  Negative for cough and shortness of breath.   Cardiovascular: Negative.  Negative for chest pain, palpitations and leg swelling.  Gastrointestinal: Negative.  Negative for heartburn, nausea and vomiting.  Musculoskeletal: Negative.  Negative for myalgias.  Neurological: Negative.  Negative for dizziness, focal weakness, seizures and headaches.  Psychiatric/Behavioral: Negative for suicidal ideas. The patient is nervous/anxious.     Past Medical History:  Diagnosis Date  . Anemia   . Anxiety ~ 2008   no meds  . Dyspnea    hx - r/t anemia per patient  . Family history of adverse reaction to anesthesia    "my sister had a hard time waking up"  . History of blood transfusion 12/20/2014   related to anemia  . Hyperlipidemia   . Hypertension   . Missed ab    x 1 - no surgery required  . SVD (spontaneous vaginal delivery)    x 2    Past Surgical History:  Procedure Laterality Date  . ABDOMINAL HYSTERECTOMY N/A 03/17/2017   Procedure: HYSTERECTOMY ABDOMINAL;  Surgeon: Emily Filbert, MD;  Location: [facility];  Service: Gynecology;  Laterality: N/A;  .  CERVIX LESION DESTRUCTION     cryro  . CYSTOSCOPY N/A 03/17/2017   Procedure: CYSTOSCOPY;  Surgeon: Emily Filbert, MD;  Location: [facility];  Service: Gynecology;  Laterality: N/A;  . DILATION AND CURETTAGE, DIAGNOSTIC / THERAPEUTIC      in office  . SALPINGOOPHORECTOMY Bilateral 03/17/2017   Procedure: SALPINGO OOPHORECTOMY;  Surgeon:  Emily Filbert, MD;  Location: [facility];  Service: Gynecology;  Laterality: Bilateral;  . TUBAL LIGATION  1990's    Family History  Problem Relation Age of Onset  . Cancer Mother        BRAIN TUMOR, VULVAR  . Hypertension Mother   . Diabetes Mother   . Cancer Father        PENILE  . Cancer Maternal Grandmother        LUNG  . Cancer Maternal Grandfather        COLON    Social History Reviewed with no changes to be made today.   Outpatient Medications Prior to Visit  Medication Sig Dispense Refill  . amLODipine (NORVASC) 10 MG tablet Take 1 tablet (10 mg total) by mouth 1 day or 1 dose. 30 tablet 5  . hydrochlorothiazide (HYDRODIURIL) 25 MG tablet Take 1 tablet (25 mg total) by mouth daily. 30 tablet 5  . ibuprofen (ADVIL,MOTRIN) 600 MG tablet Take 1 tablet (600 mg total) by mouth every 8 (eight) hours as needed for mild pain. 30 tablet 0  . meloxicam (MOBIC) 15 MG tablet Take 1 tablet (15 mg total) by mouth daily. (Patient not taking: Reported on 07/31/2017) 15 tablet 0  . potassium chloride SA (K-DUR,KLOR-CON) 20 MEQ tablet Take 1 tablet (20 mEq total) by mouth 2 (two) times daily. 60 tablet 0  . pravastatin (PRAVACHOL) 20 MG tablet Take 1 tablet (20 mg total) by mouth daily. (Patient not taking: Reported on 06/25/2017) 30 tablet 2  . Vitamin D, Ergocalciferol, (DRISDOL) 50000 units CAPS capsule Take 1 capsule (50,000 Units total) by mouth every 7 (seven) days. (Patient not taking: Reported on 06/25/2017) 16 capsule 0  . amoxicillin-clavulanate (AUGMENTIN) 875-125 MG tablet Take 1 tablet by mouth 2 (two) times daily.    . chlorhexidine (PERIDEX) 0.12 % solution Use as directed 15 mLs in the mouth or throat 2 (two) times daily. (Patient not taking: Reported on 07/31/2017) 120 mL 0   No facility-administered medications prior to visit.     Allergies  Allergen Reactions  . Prilosec [Omeprazole]     Stiff neck       Objective:    BP 114/79 (BP Location: Left Arm, Patient Position:  Sitting, Cuff Size: Large)   Pulse 77   Temp 99.4 F (37.4 C) (Oral)   Ht 5\' 1"  (1.549 m)   Wt 184 lb 6.4 oz (83.6 kg)   LMP 03/02/2017 (Approximate)   SpO2 97%   BMI 34.84 kg/m  Wt Readings from Last 3 Encounters:  09/07/17 184 lb 6.4 oz (83.6 kg)  07/31/17 186 lb (84.4 kg)  07/10/17 185 lb 8 oz (84.1 kg)    Physical Exam  Constitutional: She is oriented to person, place, and time. She appears well-developed and well-nourished. She is cooperative.  HENT:  Head: Normocephalic and atraumatic.  Eyes: EOM are normal.  Neck: Normal range of motion.  Cardiovascular: Normal rate, regular rhythm and normal heart sounds. Exam reveals no gallop and no friction rub.  No murmur heard. Pulmonary/Chest: Effort normal and breath sounds normal. No tachypnea. No respiratory distress. She has no decreased  breath sounds. She has no wheezes. She has no rhonchi. She has no rales. She exhibits no tenderness.  Abdominal: Soft. Bowel sounds are normal.  Musculoskeletal: Normal range of motion. She exhibits no edema.  Neurological: She is alert and oriented to person, place, and time. Coordination normal.  Skin: Skin is warm and dry.  Psychiatric: She has a normal mood and affect. Her speech is normal and behavior is normal. Judgment and thought content normal. Cognition and memory are normal. She expresses no suicidal plans and no homicidal plans.  Nursing note and vitals reviewed.        Patient has been counseled extensively about nutrition and exercise as well as the importance of adherence with medications and regular follow-up. The patient was given clear instructions to go to ER or return to medical center if symptoms don't improve, worsen or new problems develop. The patient verbalized understanding.   Follow-up: Return in about 3 months (around 12/08/2017) for BP recheck.   Gildardo Pounds, FNP-BC [facility] and [facility], [place]   09/07/2017,  9:12 AM

## 2017-09-16 ENCOUNTER — Telehealth (HOSPITAL_COMMUNITY): Payer: Self-pay | Admitting: *Deleted

## 2017-09-16 NOTE — Telephone Encounter (Signed)
Health coaching 3-  I spoke to the patient regarding her areas of concern, which were her weight and exercise.  I had encouraged her regarding eating more fruits and vegetables and exercising .  Patient states she has lost 5 pounds and is walking every day for at least 30 minutes.  Plan- I will follow up with patient in 3 to 4 weeks regarding her goals.  Time- 10 minutes

## 2017-09-28 MED FILL — HYDROCHLOROTHIAZIDE 25 MG T: 25 | 30 days supply | Qty: 30 | Fill #2

## 2017-09-28 MED FILL — AMLODIPINE BESYLATE 10 MG T: 10 | 30 days supply | Qty: 30 | Fill #3

## 2017-10-15 ENCOUNTER — Ambulatory Visit: Payer: Self-pay | Attending: Nurse Practitioner | Admitting: Physician Assistant

## 2017-10-15 VITALS — BP 117/74 | HR 75 | Temp 99.0°F | Resp 18 | Ht 61.0 in | Wt 188.0 lb

## 2017-10-15 DIAGNOSIS — R5383 Other fatigue: Secondary | ICD-10-CM | POA: Insufficient documentation

## 2017-10-15 DIAGNOSIS — R21 Rash and other nonspecific skin eruption: Secondary | ICD-10-CM | POA: Insufficient documentation

## 2017-10-15 DIAGNOSIS — D649 Anemia, unspecified: Secondary | ICD-10-CM | POA: Insufficient documentation

## 2017-10-15 DIAGNOSIS — F419 Anxiety disorder, unspecified: Secondary | ICD-10-CM | POA: Insufficient documentation

## 2017-10-15 DIAGNOSIS — E785 Hyperlipidemia, unspecified: Secondary | ICD-10-CM | POA: Insufficient documentation

## 2017-10-15 DIAGNOSIS — Z79899 Other long term (current) drug therapy: Secondary | ICD-10-CM | POA: Insufficient documentation

## 2017-10-15 DIAGNOSIS — I10 Essential (primary) hypertension: Secondary | ICD-10-CM | POA: Insufficient documentation

## 2017-10-15 MED ORDER — CLOTRIMAZOLE-BETAMETHASONE 1-0.05 % EX LOTN: TOPICAL_LOTION | Freq: Two times a day (BID) | CUTANEOUS | 0 refills | Status: DC

## 2017-10-15 MED FILL — hydrOXYzine HCL 50 MG TABS: 50 | 20 days supply | Qty: 60 | Fill #0

## 2017-10-15 NOTE — Progress Notes (Signed)
Patient ID: Nina Hopkins, female   DOB: [birth year], 57 y.o.   MRN: [id number]   Nina Hopkins, is a 57 y.o. female  [id number]  [id number]  DOB - 1960/06/05  Subjective:  Chief Complaint and HPI: Nina Hopkins is a 57 y.o. female here today for dry patch of skin/rash that itches.  It is improving with OTC hydrocortisone.  No f/c.  No new soaps/lotions/detergents.    C/o fatigue for about 3 weeks.  H/o anemia.  S/p hysterectomy.     ROS:   Constitutional:  No f/c, No night sweats, No unexplained weight loss. EENT:  No vision changes, No blurry vision, No hearing changes. No mouth, throat, or ear problems.  Respiratory: No cough, No SOB Cardiac: No CP, no palpitations GI:  No abd pain, No N/V/D. GU: No Urinary s/sx Musculoskeletal: No joint pain Neuro: No headache, no dizziness, no motor weakness.  Skin: + rash Endocrine:  No polydipsia. No polyuria.  Psych: Denies SI/HI  No problems updated.  ALLERGIES: Allergies  Allergen Reactions  . Prilosec [Omeprazole]     Stiff neck    PAST MEDICAL HISTORY: Past Medical History:  Diagnosis Date  . Anemia   . Anxiety ~ 2008   no meds  . Dyspnea    hx - r/t anemia per patient  . Family history of adverse reaction to anesthesia    "my sister had a hard time waking up"  . History of blood transfusion 12/20/2014   related to anemia  . Hyperlipidemia   . Hypertension   . Missed ab    x 1 - no surgery required  . SVD (spontaneous vaginal delivery)    x 2    MEDICATIONS AT HOME: Prior to Admission medications   Medication Sig Start Date End Date Taking? Authorizing Provider  amLODipine (NORVASC) 10 MG tablet Take 1 tablet (10 mg total) by mouth 1 day or 1 dose. 07/31/17  Yes Ena Dawley, Tiffany S, PA-C  hydrochlorothiazide (HYDRODIURIL) 25 MG tablet Take 1 tablet (25 mg total) by mouth daily. 07/31/17  Yes Ena Dawley, Tiffany S, PA-C  ibuprofen (ADVIL,MOTRIN) 600 MG tablet Take 1 tablet (600 mg total) by mouth every  8 (eight) hours as needed for mild pain. 02/16/17  Yes Hairston, Maylon Peppers, FNP  meloxicam (MOBIC) 15 MG tablet Take 1 tablet (15 mg total) by mouth daily. 07/20/17  Yes Yu, Amy V, PA-C  clotrimazole-betamethasone (LOTRISONE) lotion Apply topically 2 (two) times daily. 10/15/17   Argentina Donovan, PA-C     Objective:  EXAM:   Vitals:   10/15/17 1112  BP: 117/74  Pulse: 75  Resp: 18  Temp: 99 F (37.2 C)  TempSrc: Oral  SpO2: 98%  Weight: 188 lb (85.3 kg)  Height: 5\' 1"  (1.549 m)    General appearance : A&OX3. NAD. Non-toxic-appearing HEENT: Atraumatic and Normocephalic.  PERRLA. EOM intact.  Neck: supple, no JVD. No cervical lymphadenopathy. No thyromegaly Chest/Lungs:  Breathing-non-labored, Good air entry bilaterally, breath sounds normal without rales, rhonchi, or wheezing  CVS: S1 S2 regular, no murmurs, gallops, rubs  Extremities: Bilateral Lower Ext shows no edema, both legs are warm to touch with = pulse throughout Neurology:  CN II-XII grossly intact, Non focal.   Psych:  TP linear. J/I WNL. Normal speech. Appropriate eye contact and affect.  Skin:  No Rash  Data Review Lab Results  Component Value Date   HGBA1C 5.4 07/10/2017     Assessment & Plan   1. Fatigue, unspecified type -  CBC with Differential/Platelet - Vitamin D, 25-hydroxy  2. Rash Since improving, likely wont need a - clotrimazole-betamethasone (LOTRISONE) lotion; Apply topically 2 (two) times daily.  Dispense: 30 mL; Refill: 0  Patient have been counseled extensively about nutrition and exercise  No follow-ups on file.  The patient was given clear instructions to go to ER or return to medical center if symptoms don't improve, worsen or new problems develop. The patient verbalized understanding. The patient was told to call to get lab results if they haven't heard anything in the next week.     Freeman Caldron, PA-C [facility] and [facility],  [place]   10/15/2017, 11:28 AM

## 2017-10-16 ENCOUNTER — Telehealth: Payer: Self-pay | Admitting: *Deleted

## 2017-10-16 LAB — VITAMIN D 25 HYDROXY (VIT D DEFICIENCY, FRACTURES): Vit D, 25-Hydroxy: 26.1 ng/mL — ABNORMAL LOW (ref 30.0–100.0)

## 2017-10-16 LAB — CBC WITH DIFFERENTIAL/PLATELET
BASOS: 1 %
Basophils Absolute: 0.1 10*3/uL (ref 0.0–0.2)
EOS (ABSOLUTE): 0.1 10*3/uL (ref 0.0–0.4)
EOS: 2 %
HEMATOCRIT: 34.9 % (ref 34.0–46.6)
Hemoglobin: 11.8 g/dL (ref 11.1–15.9)
IMMATURE GRANS (ABS): 0 10*3/uL (ref 0.0–0.1)
IMMATURE GRANULOCYTES: 0 %
LYMPHS: 25 %
Lymphocytes Absolute: 1.2 10*3/uL (ref 0.7–3.1)
MCH: 28.3 pg (ref 26.6–33.0)
MCHC: 33.8 g/dL (ref 31.5–35.7)
MCV: 84 fL (ref 79–97)
MONOCYTES: 8 %
Monocytes Absolute: 0.4 10*3/uL (ref 0.1–0.9)
NEUTROS PCT: 64 %
Neutrophils Absolute: 3.1 10*3/uL (ref 1.4–7.0)
PLATELETS: 278 10*3/uL (ref 150–450)
RBC: 4.17 x10E6/uL (ref 3.77–5.28)
RDW: 12.8 % (ref 12.3–15.4)
WBC: 4.9 10*3/uL (ref 3.4–10.8)

## 2017-10-16 NOTE — Telephone Encounter (Signed)
Patient verified DOB Patient is aware of blood being normal and needing to start vitamin d supplement OTC of 2000 units daily. Patient will have a recheck in 3-4 months. No further questions.

## 2017-10-16 NOTE — Telephone Encounter (Signed)
-----   Message from Argentina Donovan, [place] sent at 10/16/2017  5:59 AM EDT ----- Your blood count is normal.  Your vitamin D is only a little low.  This can contribute to muscle aches, anxiety, fatigue, and depression.  Purchase vitamin D and take 2000 units daily.  We will recheck this level in 3-4 months.  Thanks, Freeman Caldron, PA-C

## 2017-10-29 MED FILL — HYDROCHLOROTHIAZIDE 25 MG T: 25 | 30 days supply | Qty: 30 | Fill #3

## 2017-10-29 MED FILL — AMLODIPINE BESYLATE 10 MG T: 10 | 30 days supply | Qty: 30 | Fill #4

## 2017-11-17 ENCOUNTER — Ambulatory Visit: Payer: Self-pay

## 2017-11-17 ENCOUNTER — Ambulatory Visit: Payer: Self-pay | Attending: Nurse Practitioner | Admitting: Nurse Practitioner

## 2017-11-17 ENCOUNTER — Encounter: Payer: Self-pay | Admitting: Nurse Practitioner

## 2017-11-17 VITALS — BP 123/83 | HR 80 | Temp 99.1°F | Ht 61.0 in | Wt 191.8 lb

## 2017-11-17 DIAGNOSIS — M549 Dorsalgia, unspecified: Secondary | ICD-10-CM | POA: Insufficient documentation

## 2017-11-17 DIAGNOSIS — M79605 Pain in left leg: Secondary | ICD-10-CM

## 2017-11-17 DIAGNOSIS — E785 Hyperlipidemia, unspecified: Secondary | ICD-10-CM | POA: Insufficient documentation

## 2017-11-17 DIAGNOSIS — M79604 Pain in right leg: Secondary | ICD-10-CM

## 2017-11-17 DIAGNOSIS — I10 Essential (primary) hypertension: Secondary | ICD-10-CM | POA: Insufficient documentation

## 2017-11-17 DIAGNOSIS — F32A Depression, unspecified: Secondary | ICD-10-CM

## 2017-11-17 DIAGNOSIS — F329 Major depressive disorder, single episode, unspecified: Secondary | ICD-10-CM

## 2017-11-17 DIAGNOSIS — F419 Anxiety disorder, unspecified: Secondary | ICD-10-CM | POA: Insufficient documentation

## 2017-11-17 MED ORDER — ESCITALOPRAM OXALATE 20 MG PO TABS: ORAL_TABLET | ORAL | 1 refills | Status: DC

## 2017-11-17 MED ORDER — BUSPIRONE HCL 15 MG PO TABS: 15.0000 mg | ORAL_TABLET | Freq: Three times a day (TID) | ORAL | 1 refills | Status: DC

## 2017-11-17 NOTE — Progress Notes (Signed)
Assessment & Plan:  Nina Hopkins was seen today for anxiety and generalized body aches.  Diagnoses and all orders for this visit:  Anxiety and depression -     busPIRone (BUSPAR) 15 MG tablet; Take 1 tablet (15 mg total) by mouth 3 (three) times daily. -     escitalopram (LEXAPRO) 20 MG tablet; Take 1/2 tablet 10mg  by mouth daily for 1 week. If no side effects can increase to 1 tablet 20 mg daily by mouth  Pain in both lower extremities -     B12 and Folate Panel    Patient has been counseled on age-appropriate routine health concerns for screening and prevention. These are reviewed and up-to-date. Referrals have been placed accordingly. Immunizations are up-to-date or declined.    Subjective:   Chief Complaint  Patient presents with  . Anxiety    Pt. stated the Hydroxyzine is only making her sleepy.   . Generalized Body Aches    Pt. stated she have leg and back aches.    HPI Nina Hopkins 57 y.o. female presents to office today for follow up to Anxiety and Depression.  She was started on Vistaril 50 mg on 09/07/2016 for symptoms of anxiety.  Today she endorses hydroxyzine cause increased drowsiness and she has stopped taking it.  She is also endorsing depression today and PHQ 9 score is elevated compared to her office visit in August.  She is agreeable to starting Lexapro 10 mg daily as well as BuSpar.  She currently denies any symptoms of suicidal ideation.  Depression screen [facility] 2/9 11/17/2017 11/17/2017 10/15/2017 09/07/2017 07/31/2017  Decreased Interest 2 2 0 0 0  Down, Depressed, Hopeless 2 2 0 0 0  PHQ - 2 Score 4 4 0 0 0  Altered sleeping 1 1 0 2 0  Tired, decreased energy 1 1 3 1  0  Change in appetite 2 2 0 0 0  Feeling bad or failure about yourself  3 3 2  0 0  Trouble concentrating 3 3 0 2 0  Moving slowly or fidgety/restless 1 1 0 0 0  Suicidal thoughts 0 0 0 0 0  PHQ-9 Score 15 15 5 5  0  Some recent data might be hidden   GAD 7 : Generalized Anxiety Score  11/17/2017 11/17/2017 10/15/2017 09/07/2017  Nervous, Anxious, on Edge 3 3 1 3   Control/stop worrying 3 3 1 3   Worry too much - different things 3 3 1 3   Trouble relaxing 3 3 1 3   Restless 0 0 0 0  Easily annoyed or irritable 1 1 0 -  Afraid - awful might happen 3 3 0 2  Total GAD 7 Score 16 16 4  -   Neuropathy: She describes symptoms of lancinating pain, cramping and squeezing. Onset of symptoms was several weeks ago. Symptoms are currently of mild and moderate severity. Symptoms occur intermittently and lasts minutes to hours. The patient denies numbness, burning and hypersensitivity. Symptoms are symmetric in the BLE.   Previous treatment has included NOTHING.  Review of Systems  Constitutional: Negative for fever, malaise/fatigue and weight loss.  HENT: Negative.  Negative for nosebleeds.   Eyes: Negative.  Negative for blurred vision, double vision and photophobia.  Respiratory: Negative.  Negative for cough and shortness of breath.   Cardiovascular: Negative.  Negative for chest pain, palpitations and leg swelling.  Gastrointestinal: Negative.  Negative for heartburn, nausea and vomiting.  Musculoskeletal: Negative for myalgias.       SEE HPI  Neurological:  Positive for sensory change. Negative for dizziness, focal weakness, seizures and headaches.  Psychiatric/Behavioral: Positive for depression. Negative for suicidal ideas. The patient is nervous/anxious.     Past Medical History:  Diagnosis Date  . Anemia   . Anxiety ~ 2008   no meds  . Dyspnea    hx - r/t anemia per patient  . Family history of adverse reaction to anesthesia    "my sister had a hard time waking up"  . History of blood transfusion 12/20/2014   related to anemia  . Hyperlipidemia   . Hypertension   . Missed ab    x 1 - no surgery required  . SVD (spontaneous vaginal delivery)    x 2    Past Surgical History:  Procedure Laterality Date  . ABDOMINAL HYSTERECTOMY N/A 03/17/2017   Procedure: HYSTERECTOMY  ABDOMINAL;  Surgeon: Emily Filbert, MD;  Location: [facility];  Service: Gynecology;  Laterality: N/A;  . CERVIX LESION DESTRUCTION     cryro  . CYSTOSCOPY N/A 03/17/2017   Procedure: CYSTOSCOPY;  Surgeon: Emily Filbert, MD;  Location: [facility];  Service: Gynecology;  Laterality: N/A;  . DILATION AND CURETTAGE, DIAGNOSTIC / THERAPEUTIC      in office  . SALPINGOOPHORECTOMY Bilateral 03/17/2017   Procedure: SALPINGO OOPHORECTOMY;  Surgeon: Emily Filbert, MD;  Location: [facility];  Service: Gynecology;  Laterality: Bilateral;  . TUBAL LIGATION  1990's    Family History  Problem Relation Age of Onset  . Cancer Mother        BRAIN TUMOR, VULVAR  . Hypertension Mother   . Diabetes Mother   . Cancer Father        PENILE  . Cancer Maternal Grandmother        LUNG  . Cancer Maternal Grandfather        COLON    Social History Reviewed with no changes to be made today.   Outpatient Medications Prior to Visit  Medication Sig Dispense Refill  . amLODipine (NORVASC) 10 MG tablet Take 1 tablet (10 mg total) by mouth 1 day or 1 dose. 30 tablet 5  . clotrimazole-betamethasone (LOTRISONE) lotion Apply topically 2 (two) times daily. 30 mL 0  . hydrochlorothiazide (HYDRODIURIL) 25 MG tablet Take 1 tablet (25 mg total) by mouth daily. 30 tablet 5  . ibuprofen (ADVIL,MOTRIN) 600 MG tablet Take 1 tablet (600 mg total) by mouth every 8 (eight) hours as needed for mild pain. 30 tablet 0  . meloxicam (MOBIC) 15 MG tablet Take 1 tablet (15 mg total) by mouth daily. (Patient not taking: Reported on 11/17/2017) 15 tablet 0   No facility-administered medications prior to visit.     Allergies  Allergen Reactions  . Prilosec [Omeprazole]     Stiff neck       Objective:    BP 123/83 (BP Location: Left Arm, Patient Position: Sitting, Cuff Size: Normal)   Pulse 80   Temp 99.1 F (37.3 C) (Oral)   Ht 5\' 1"  (1.549 m)   Wt 191 lb 12.8 oz (87 kg)   LMP 03/02/2017 (Approximate)   SpO2 97%   BMI 36.24 kg/m  Wt  Readings from Last 3 Encounters:  11/17/17 191 lb 12.8 oz (87 kg)  10/15/17 188 lb (85.3 kg)  09/07/17 184 lb 6.4 oz (83.6 kg)    Physical Exam  Constitutional: She is oriented to person, place, and time. She appears well-developed and well-nourished. She is cooperative.  HENT:  Head: Normocephalic and atraumatic.  Eyes: EOM are normal.  Neck: Normal range of motion.  Cardiovascular: Normal rate, regular rhythm, normal heart sounds and intact distal pulses. Exam reveals no gallop and no friction rub.  No murmur heard. Pulmonary/Chest: Effort normal and breath sounds normal. No tachypnea. No respiratory distress. She has no decreased breath sounds. She has no wheezes. She has no rhonchi. She has no rales. She exhibits no tenderness.  Abdominal: Soft. Bowel sounds are normal.  Musculoskeletal: Normal range of motion. She exhibits no edema, tenderness or deformity.  Neurological: She is alert and oriented to person, place, and time. Coordination normal.  Skin: Skin is warm and dry. No rash noted. No erythema. No pallor.  Psychiatric: She has a normal mood and affect. Her speech is normal and behavior is normal. Judgment and thought content normal. Cognition and memory are normal.  Nursing note and vitals reviewed.      Patient has been counseled extensively about nutrition and exercise as well as the importance of adherence with medications and regular follow-up. The patient was given clear instructions to go to ER or return to medical center if symptoms don't improve, worsen or new problems develop. The patient verbalized understanding.   Follow-up: Return for Please schedule with nubea for ABI same day that she sees me on 12-08-2017.   Gildardo Pounds, FNP-BC [facility] and [place], [place]   11/18/2017, 10:19 PM

## 2017-11-17 NOTE — Patient Instructions (Addendum)
Behavioral Health Resources:  ? ?What if I or someone I know is in crisis? ? ?If you are thinking about harming yourself or having thoughts of suicide, or if you know someone who is, seek help right away. ? ?Call your doctor or mental health care provider. ? ?Call 911 or go to a hospital emergency room to get immediate help, or ask a friend or family member to help you do these things. ? ?Call the [place] National Suicide Prevention Lifeline?s toll-free, 24-hour hotline at 1-800-273-TALK (1-800-273-8255) or TTY: 1-800-799-4 TTY (1-800-799-4889) to talk to a trained counselor. ? ?If you are in crisis, make sure you are not left alone.  ? ?If someone else is in crisis, make sure he or she is not left alone ? ? ?24 Hour Availability ? ?[facility]  ?[address], [place], [place] [address]  ?336-832-9700 or 1-800-711-2635 ? ?[facility] Crisis Line ?(Domestic Violence, Rape & Victim Assistance ?336-273-7273 ? ?[facility]  ?[address] [place], [place]  [address]               1-855-788-8787 or 336-676-6840 ? ?[facility] [place] [facility]    ?(ONLY from 8am-4pm)    ?336-899-1505 ? ?[facility] (24/7)   ?1-877-626-1772 ? ?[place] National Suicide Hotline   ?1-800-273-8255 (TALK) ? ?Support from local police to aid getting patient to hospital (http://www.[place].gov/index.aspx?page=2797) ? ? ?      ?ONGOING BEHAVIORAL HEALTH SUPPORT FOR UNINSURED and UNDERINSURED:  ?[facility]  ?336-676-6840  ?[address]  ?Walk-in first time, Monday-Friday, 8:30am-5:00pm  ?*Bring snack, drink, something to do, long wait at first visit, they do have pharmacy for behavioral health medications/ Bring own interpreter at first visit, if needed ?[facility]  ?336-387-6161  ?[address]  ?Walk-in Monday-Friday, 8:30am-12pm & 1-2:30pm  ?*pacientes que hablen espanol, favor comunicarse con el Sr.  [place], extension 2244 o la [address], extension 3331 para hacer una cita  ?[facility]:  ?336-429-5600 or kellinfoundation@gmail.com  ?[address], [address]  ?Call or email, may self-refer  ?* uninsured/underinsured, 19-64yo, have both mental health and substance use challenges  ?[facility]:  ?Phone (336) [phone]; Fax [phone]  ?*Call to schedule an appointment  ?3rd Floor located @?[address]  ?Mon-Thursday: 8:30am-8:00pm Friday: 8:30am-7:00pm  ?* Be sure to park in a space labeled ?Psychology Department,? located to the right of the main door of the building. Enter the main doors facing the parking lot and take the elevator or stairs to the 3rd Floor.  ?[facility]:  ?336-832-9700 or  ?1-800-711-2635 (24/hour helpline)  ?[address]  ?Call to make appointment, tends to be a long wait to begin services, depending on insurance  ?[facility]  ?(336) 333-6860 ??  ?*Call to schedule an appointment  ?[address]  ?Monday-Friday, 8:00am-5:00pm  ?[facility]  ?(336) 899-1505  ?[address], [place]  ?Monday-Friday, walk-in 8am-3pm  ?First appointment is assessment, then will make appointment for psychiatry   ?  ?

## 2017-11-18 ENCOUNTER — Encounter: Payer: Self-pay | Admitting: Nurse Practitioner

## 2017-11-18 LAB — B12 AND FOLATE PANEL
Folate: >20 ng/mL (ref >3.0)
Vitamin B-12: 696 pg/mL (ref 232–1245)

## 2017-12-03 MED FILL — AMLODIPINE BESYLATE 10 MG T: 10 | 30 days supply | Qty: 30 | Fill #5

## 2017-12-03 MED FILL — HYDROCHLOROTHIAZIDE 25 MG T: 25 | 30 days supply | Qty: 30 | Fill #4

## 2017-12-08 ENCOUNTER — Ambulatory Visit: Payer: Self-pay | Admitting: Nurse Practitioner

## 2017-12-08 ENCOUNTER — Ambulatory Visit: Payer: Self-pay

## 2017-12-18 ENCOUNTER — Ambulatory Visit: Payer: Self-pay

## 2018-01-05 MED FILL — AMLODIPINE BESYLATE 10 MG T: 10 | 30 days supply | Qty: 30 | Fill #0

## 2018-01-05 MED FILL — HYDROCHLOROTHIAZIDE 25 MG T: 25 | 30 days supply | Qty: 30 | Fill #5

## 2018-01-15 ENCOUNTER — Telehealth (HOSPITAL_COMMUNITY): Payer: Self-pay | Admitting: *Deleted

## 2018-01-15 NOTE — Telephone Encounter (Signed)
[facility] Follow up  Medical History:  Patient states that she has not been diagnosed with high cholesterol,  diabetes or heart disease. Patient states that she has been diagnosed with high blood pressure.  Medications:  Patients states she is not taking any medications for high cholesterol,  or diabetes.  She is not taking aspirin daily to prevent heart attack or stroke. Patient states that she does take medication for high blood pressure.   Blood pressure, self measurement:  Patients states she does measure blood pressure at home.    Nutrition:  Patient states she eats 1 cup of fruit and 3 cups  of vegetables in an average day.  Patient states she does not eat fish regularly, she eats about a  half a serving of whole grains daily. She drinks less than 36 ounces of beverages with added sugar weekly.  She is currently watching her sodium intake.  She has not had any drinks containing alcohol in the last seven days.    Physical activity:  Patient states that she gets 200 minutes of moderate exercise in a week.  She gets 0 minutes of vigorous exercise per week.   Smoking status:  Patient states she has never smoked and is not around any smokers.     Quality of life:  Patient states that she has had 0 bad physical days out of the last 30 days. In the last 2 weeks, she has had 0 days that she has felt down or depressed. She has had 0 days in the last 2 weeks that she has had little interest or pleasure in doing things.

## 2018-01-20 ENCOUNTER — Telehealth: Payer: Self-pay | Admitting: Nurse Practitioner

## 2018-01-20 NOTE — Telephone Encounter (Signed)
Pt called stating that her Credit Score reflects several bills from [place] going far back as December of 2018, she states she has never applied for Medicaid and has had 100% Discount since 2018. Please forward a copy of her Letters to [place] when possible or call patient back with an update

## 2018-01-21 ENCOUNTER — Telehealth: Payer: Self-pay | Admitting: Nurse Practitioner

## 2018-01-21 NOTE — Telephone Encounter (Signed)
Unable to LVM, just want to inform the Pt to please bring the lab bills to the office to check on the date of service to see if can be cover by [employer]

## 2018-01-29 MED FILL — HYDROCHLOROTHIAZIDE 25 MG T: 25 | 30 days supply | Qty: 30 | Fill #0

## 2018-01-29 MED FILL — AMLODIPINE BESYLATE 10 MG T: 10 | 30 days supply | Qty: 30 | Fill #1

## 2018-02-11 MED FILL — HYDROCHLOROTHIAZIDE 25 MG T: 25 | 30 days supply | Qty: 30 | Fill #0

## 2018-02-11 MED FILL — AMLODIPINE BESYLATE 10 MG T: 10 | 30 days supply | Qty: 30 | Fill #1

## 2018-02-25 ENCOUNTER — Ambulatory Visit: Payer: Self-pay | Attending: Nurse Practitioner

## 2018-03-08 ENCOUNTER — Other Ambulatory Visit: Payer: Self-pay | Admitting: Nurse Practitioner

## 2018-03-08 MED FILL — AMLODIPINE BESYLATE 10 MG T: 10 | 30 days supply | Qty: 30 | Fill #2

## 2018-03-19 MED FILL — AMLODIPINE BESYLATE 10 MG T: 10 | 30 days supply | Qty: 30 | Fill #2

## 2018-03-19 MED FILL — HYDROCHLOROTHIAZIDE 25 MG T: 25 | 30 days supply | Qty: 30 | Fill #0

## 2018-03-23 ENCOUNTER — Ambulatory Visit: Payer: Self-pay | Attending: Nurse Practitioner | Admitting: Nurse Practitioner

## 2018-03-23 ENCOUNTER — Encounter: Payer: Self-pay | Admitting: Nurse Practitioner

## 2018-03-23 VITALS — BP 142/92 | HR 71 | Temp 98.8°F | Ht 61.0 in | Wt 199.4 lb

## 2018-03-23 DIAGNOSIS — F419 Anxiety disorder, unspecified: Secondary | ICD-10-CM

## 2018-03-23 DIAGNOSIS — F329 Major depressive disorder, single episode, unspecified: Secondary | ICD-10-CM

## 2018-03-23 DIAGNOSIS — K0889 Other specified disorders of teeth and supporting structures: Secondary | ICD-10-CM

## 2018-03-23 MED ORDER — PENICILLIN V POTASSIUM 500 MG PO TABS: 500.0000 mg | ORAL_TABLET | Freq: Four times a day (QID) | ORAL | 0 refills | Status: AC

## 2018-03-23 MED FILL — PENICILLIN VK 500 MG TABLET: 500 | 7 days supply | Qty: 28 | Fill #0

## 2018-03-23 NOTE — Progress Notes (Signed)
Assessment & Plan:  Nina Hopkins was seen today for follow-up.  Diagnoses and all orders for this visit:  Dentalgia -     penicillin v potassium (VEETID) 500 MG tablet; Take 1 tablet (500 mg total) by mouth 4 (four) times daily for 7 days.  Anxiety and depression Improved. Stable.   Patient has been counseled on age-appropriate routine health concerns for screening and prevention. These are reviewed and up-to-date. Referrals have been placed accordingly. Immunizations are up-to-date or declined.    Subjective:   Chief Complaint  Patient presents with  . Follow-up    Pt. stated her anxiety and depression got much better. Patient no longer have pain on her lower extremities   HPI Nina Hopkins 58 y.o. female presents to office today for follow up to anxiety and depression.  Depression screen [facility] 2/9 03/23/2018 11/17/2017 11/17/2017 10/15/2017 09/07/2017  Decreased Interest 0 2 2 0 0  Down, Depressed, Hopeless 0 2 2 0 0  PHQ - 2 Score 0 4 4 0 0  Altered sleeping 0 1 1 0 2  Tired, decreased energy 0 1 1 3 1   Change in appetite 0 2 2 0 0  Feeling bad or failure about yourself  0 3 3 2  0  Trouble concentrating 0 3 3 0 2  Moving slowly or fidgety/restless 0 1 1 0 0  Suicidal thoughts 0 0 0 0 0  PHQ-9 Score 0 15 15 5 5   Some recent data might be hidden  She is no longer taking lexapro or buspar. States symptoms have resolved and she is feeling much better. She currently denies any thoughts of self harm.   Dental Problem Broken right upper molar. Some swelling and pain. No pus, drainage or foul odor.     Review of Systems  Constitutional: Negative for fever, malaise/fatigue and weight loss.  HENT: Negative for nosebleeds.        SEE HPI  Eyes: Negative.  Negative for blurred vision, double vision and photophobia.  Respiratory: Negative.  Negative for cough and shortness of breath.   Cardiovascular: Negative.  Negative for chest pain, palpitations and leg swelling.    Gastrointestinal: Negative.  Negative for heartburn, nausea and vomiting.  Musculoskeletal: Negative.  Negative for myalgias.  Neurological: Negative.  Negative for dizziness, focal weakness, seizures and headaches.  Psychiatric/Behavioral: Negative.  Negative for depression, memory loss and suicidal ideas. The patient does not have insomnia.     Past Medical History:  Diagnosis Date  . Anemia   . Anxiety ~ 2008   no meds  . Dyspnea    hx - r/t anemia per patient  . Family history of adverse reaction to anesthesia    "my sister had a hard time waking up"  . History of blood transfusion 12/20/2014   related to anemia  . Hyperlipidemia   . Hypertension   . Missed ab    x 1 - no surgery required  . SVD (spontaneous vaginal delivery)    x 2    Past Surgical History:  Procedure Laterality Date  . ABDOMINAL HYSTERECTOMY N/A 03/17/2017   Procedure: HYSTERECTOMY ABDOMINAL;  Surgeon: Emily Filbert, MD;  Location: [facility];  Service: Gynecology;  Laterality: N/A;  . CERVIX LESION DESTRUCTION     cryro  . CYSTOSCOPY N/A 03/17/2017   Procedure: CYSTOSCOPY;  Surgeon: Emily Filbert, MD;  Location: [facility];  Service: Gynecology;  Laterality: N/A;  . DILATION AND CURETTAGE, DIAGNOSTIC / THERAPEUTIC  in office  . SALPINGOOPHORECTOMY Bilateral 03/17/2017   Procedure: SALPINGO OOPHORECTOMY;  Surgeon: Emily Filbert, MD;  Location: [facility];  Service: Gynecology;  Laterality: Bilateral;  . TUBAL LIGATION  1990's    Family History  Problem Relation Age of Onset  . Cancer Mother        BRAIN TUMOR, VULVAR  . Hypertension Mother   . Diabetes Mother   . Cancer Father        PENILE  . Cancer Maternal Grandmother        LUNG  . Cancer Maternal Grandfather        COLON    Social History Reviewed with no changes to be made today.   Outpatient Medications Prior to Visit  Medication Sig Dispense Refill  . amLODipine (NORVASC) 10 MG tablet Take 1 tablet (10 mg total) by mouth 1 day or 1 dose. 30  tablet 5  . busPIRone (BUSPAR) 15 MG tablet Take 1 tablet (15 mg total) by mouth 3 (three) times daily. 60 tablet 1  . hydrochlorothiazide (HYDRODIURIL) 25 MG tablet TAKE 1 TABLET (25 MG TOTAL) BY MOUTH DAILY. 30 tablet 0  . clotrimazole-betamethasone (LOTRISONE) lotion Apply topically 2 (two) times daily. (Patient not taking: Reported on 03/23/2018) 30 mL 0  . escitalopram (LEXAPRO) 20 MG tablet Take 1/2 tablet 10mg  by mouth daily for 1 week. If no side effects can increase to 1 tablet 20 mg daily by mouth (Patient not taking: Reported on 03/23/2018) 30 tablet 1  . meloxicam (MOBIC) 15 MG tablet Take 1 tablet (15 mg total) by mouth daily. (Patient not taking: Reported on 11/17/2017) 15 tablet 0   No facility-administered medications prior to visit.     Allergies  Allergen Reactions  . Prilosec [Omeprazole]     Stiff neck       Objective:    BP (!) 142/92 (BP Location: Right Arm, Patient Position: Sitting, Cuff Size: Large)   Pulse 71   Temp 98.8 F (37.1 C) (Oral)   Ht 5\' 1"  (1.549 m)   Wt 199 lb 6.4 oz (90.4 kg)   LMP 03/02/2017 (Approximate)   SpO2 97%   BMI 37.68 kg/m  Wt Readings from Last 3 Encounters:  03/23/18 199 lb 6.4 oz (90.4 kg)  11/17/17 191 lb 12.8 oz (87 kg)  10/15/17 188 lb (85.3 kg)    Physical Exam Vitals signs and nursing note reviewed.  Constitutional:      Appearance: She is well-developed.  HENT:     Head: Normocephalic and atraumatic.     Mouth/Throat:     Mouth: Mucous membranes are moist.     Dentition: Abnormal dentition. Has dentures (partial ). Dental tenderness and dental caries present. No gingival swelling, dental abscesses or gum lesions.   Neck:     Musculoskeletal: Normal range of motion.  Cardiovascular:     Rate and Rhythm: Normal rate and regular rhythm.     Heart sounds: Normal heart sounds. No murmur. No friction rub. No gallop.   Pulmonary:     Effort: Pulmonary effort is normal. No tachypnea or respiratory distress.      Breath sounds: Normal breath sounds. No decreased breath sounds, wheezing, rhonchi or rales.  Chest:     Chest wall: No tenderness.  Abdominal:     General: Bowel sounds are normal.     Palpations: Abdomen is soft.  Musculoskeletal: Normal range of motion.  Skin:    General: Skin is warm and dry.  Neurological:  Mental Status: She is alert and oriented to person, place, and time.     Coordination: Coordination normal.  Psychiatric:        Behavior: Behavior normal. Behavior is cooperative.        Thought Content: Thought content normal.        Judgment: Judgment normal.        Patient has been counseled extensively about nutrition and exercise as well as the importance of adherence with medications and regular follow-up. The patient was given clear instructions to go to ER or return to medical center if symptoms don't improve, worsen or new problems develop. The patient verbalized understanding.   Follow-up: Return if symptoms worsen or fail to improve.   Gildardo Pounds, FNP-BC [facility] and [place], [place]   03/23/2018, 10:54 AM

## 2018-04-19 ENCOUNTER — Ambulatory Visit: Payer: Self-pay

## 2018-04-22 ENCOUNTER — Other Ambulatory Visit: Payer: Self-pay | Admitting: Nurse Practitioner

## 2018-04-28 MED FILL — ?AMLODIPINE BESYLATE 10 MG: 10 | 30 days supply | Qty: 30 | Fill #3

## 2018-04-28 MED FILL — HYDROCHLOROTHIAZIDE 25 MG T: 25 | 30 days supply | Qty: 30 | Fill #0

## 2018-05-25 MED FILL — hydrOXYzine HCL 50 MG TABS: 50 | 20 days supply | Qty: 60 | Fill #1

## 2018-05-25 MED FILL — HYDROCHLOROTHIAZIDE 25 MG T: 25 | 30 days supply | Qty: 30 | Fill #1

## 2018-05-25 MED FILL — ?AMLODIPINE BESYLATE 10 MG: 10 | 30 days supply | Qty: 30 | Fill #4

## 2018-06-29 MED FILL — HYDROCHLOROTHIAZIDE 25 MG T: 25 | 30 days supply | Qty: 30 | Fill #2

## 2018-06-29 MED FILL — ?AMLODIPINE BESYLATE 10 MG: 10 | 30 days supply | Qty: 30 | Fill #5

## 2018-07-29 ENCOUNTER — Telehealth: Payer: Self-pay | Admitting: Nurse Practitioner

## 2018-07-29 DIAGNOSIS — I10 Essential (primary) hypertension: Secondary | ICD-10-CM

## 2018-07-29 MED ORDER — HYDROCHLOROTHIAZIDE 25 MG PO TABS: 25.0000 mg | ORAL_TABLET | Freq: Every day | ORAL | 2 refills | Status: DC

## 2018-07-29 MED ORDER — AMLODIPINE BESYLATE 10 MG PO TABS: 10.0000 mg | ORAL_TABLET | ORAL | 2 refills | Status: DC

## 2018-07-29 MED FILL — ?AMLODIPINE BESYLATE 10 MG: 10 | 30 days supply | Qty: 30 | Fill #0

## 2018-07-29 MED FILL — ?HYDROCHLOROTHIAZIDE 25MG T: 25 | 30 days supply | Qty: 30 | Fill #0

## 2018-07-29 NOTE — Telephone Encounter (Signed)
Patient called to request medication refill for   hydrochlorothiazide (HYDRODIURIL) 25 MG tablet   amLODipine (NORVASC) 10 MG tablet   Patient uses [facility]   Please advice [phone]  Thank you Nina Hopkins

## 2018-09-07 MED FILL — ?HYDROCHLOROTHIAZIDE 25MG T: 25 | 30 days supply | Qty: 30 | Fill #1

## 2018-09-07 MED FILL — ?AMLODIPINE BESYLATE 10 MG: 10 | 30 days supply | Qty: 30 | Fill #1

## 2018-10-13 MED FILL — ?AMLODIPINE BESYLATE 10 MG: 10 | 30 days supply | Qty: 30 | Fill #2

## 2018-10-13 MED FILL — ?HYDROCHLOROTHIAZIDE 25MG T: 25 | 30 days supply | Qty: 30 | Fill #2

## 2018-11-17 ENCOUNTER — Other Ambulatory Visit: Payer: Self-pay | Admitting: Nurse Practitioner

## 2018-11-17 DIAGNOSIS — I10 Essential (primary) hypertension: Secondary | ICD-10-CM

## 2018-11-17 MED FILL — AMLODIPINE BESYLATE 10 MG T: 10 | 30 days supply | Qty: 30 | Fill #0

## 2018-11-17 MED FILL — HYDROCHLOROTHIAZIDE 25 MG T: 25 | 30 days supply | Qty: 30 | Fill #0

## 2018-11-22 ENCOUNTER — Other Ambulatory Visit: Payer: Self-pay | Admitting: Family Medicine

## 2018-11-22 DIAGNOSIS — I10 Essential (primary) hypertension: Secondary | ICD-10-CM

## 2018-12-13 ENCOUNTER — Ambulatory Visit: Payer: Self-pay | Admitting: Nurse Practitioner

## 2018-12-16 ENCOUNTER — Ambulatory Visit: Payer: Self-pay | Attending: Nurse Practitioner | Admitting: Physician Assistant

## 2018-12-16 ENCOUNTER — Other Ambulatory Visit: Payer: Self-pay

## 2018-12-16 DIAGNOSIS — F329 Major depressive disorder, single episode, unspecified: Secondary | ICD-10-CM

## 2018-12-16 DIAGNOSIS — F419 Anxiety disorder, unspecified: Secondary | ICD-10-CM

## 2018-12-16 DIAGNOSIS — I10 Essential (primary) hypertension: Secondary | ICD-10-CM

## 2018-12-16 DIAGNOSIS — Z131 Encounter for screening for diabetes mellitus: Secondary | ICD-10-CM

## 2018-12-16 DIAGNOSIS — F32A Depression, unspecified: Secondary | ICD-10-CM

## 2018-12-16 MED ORDER — AMLODIPINE BESYLATE 10 MG PO TABS: ORAL_TABLET | ORAL | 5 refills | Status: DC

## 2018-12-16 MED ORDER — BUSPIRONE HCL 15 MG PO TABS: 15.0000 mg | ORAL_TABLET | Freq: Three times a day (TID) | ORAL | 1 refills | Status: DC

## 2018-12-16 MED ORDER — HYDROCHLOROTHIAZIDE 25 MG PO TABS: 25.0000 mg | ORAL_TABLET | Freq: Every day | ORAL | 5 refills | Status: DC

## 2018-12-16 MED FILL — ?HYDROCHLOROTHIAZIDE 25MG T: 25 | 30 days supply | Qty: 30 | Fill #0

## 2018-12-16 MED FILL — ?AMLODIPINE BESYLATE 10 MG: 10 | 30 days supply | Qty: 30 | Fill #0

## 2018-12-16 MED FILL — ?BUSPIRONE HCL 15 MG TABLET: 15 | 20 days supply | Qty: 60 | Fill #0

## 2018-12-16 NOTE — Progress Notes (Signed)
Patient ID: Nina Hopkins, female   DOB: 14-Jan-1961, 57 y.o.   MRN: [id number] Virtual Visit via Telephone Note  I connected with Nina Hopkins on 12/16/18 at  9:50 AM EST by telephone and verified that I am speaking with the correct person using two identifiers.   I discussed the limitations, risks, security and privacy concerns of performing an evaluation and management service by telephone and the availability of in person appointments. I also discussed with the patient that there may be a patient responsible charge related to this service. The patient expressed understanding and agreed to proceed.  Patient location:  home My Location:  [place] office Persons on the call:  Me and the patient   History of Present Illness:  Patient needs RF of meds.  BP has been controlled OOO.  She is willing to come next week for labs.  Only takes buspar occasionally.  No CP/HA/dizziness/SOB.  No labs in >1 year    Observations/Objective:  NAD.  A&Ox3   Assessment and Plan: 1. Hypertension, unspecified type Controlled based on home readings - amLODipine (NORVASC) 10 MG tablet; TAKE 1 TABLET (10 MG TOTAL) BY MOUTH DAILY.  Dispense: 30 tablet; Refill: 5 - hydrochlorothiazide (HYDRODIURIL) 25 MG tablet; Take 1 tablet (25 mg total) by mouth daily.  Dispense: 30 tablet; Refill: 5 - CBC with Differential; Future - Comprehensive metabolic panel; Future  2. Anxiety and depression - busPIRone (BUSPAR) 15 MG tablet; Take 1 tablet (15 mg total) by mouth 3 (three) times daily.  Dispense: 60 tablet; Refill: 1 - TSH; Future  3. Screening for diabetes mellitus - Hemoglobin A1c; Future   Follow Up Instructions: 3 months with PCP   I discussed the assessment and treatment plan with the patient. The patient was provided an opportunity to ask questions and all were answered. The patient agreed with the plan and demonstrated an understanding of the instructions.   The patient was advised to call  back or seek an in-person evaluation if the symptoms worsen or if the condition fails to improve as anticipated.  I provided 12 minutes of non-face-to-face time during this encounter.   Freeman Caldron, PA-C

## 2018-12-24 ENCOUNTER — Other Ambulatory Visit: Payer: Self-pay

## 2018-12-24 ENCOUNTER — Ambulatory Visit: Payer: Self-pay | Attending: Nurse Practitioner

## 2018-12-24 DIAGNOSIS — F32A Depression, unspecified: Secondary | ICD-10-CM

## 2018-12-24 DIAGNOSIS — F329 Major depressive disorder, single episode, unspecified: Secondary | ICD-10-CM

## 2018-12-24 DIAGNOSIS — I10 Essential (primary) hypertension: Secondary | ICD-10-CM

## 2018-12-24 DIAGNOSIS — Z131 Encounter for screening for diabetes mellitus: Secondary | ICD-10-CM

## 2018-12-24 DIAGNOSIS — F419 Anxiety disorder, unspecified: Secondary | ICD-10-CM

## 2018-12-25 LAB — CBC WITH DIFFERENTIAL/PLATELET
Basophils Absolute: 0.1 10*3/uL (ref 0.0–0.2)
Basos: 1 %
EOS (ABSOLUTE): 0.1 10*3/uL (ref 0.0–0.4)
Eos: 2 %
Hematocrit: 35.9 % (ref 34.0–46.6)
Hemoglobin: 12.3 g/dL (ref 11.1–15.9)
Immature Grans (Abs): 0 10*3/uL (ref 0.0–0.1)
Immature Granulocytes: 0 %
Lymphocytes Absolute: 1.8 10*3/uL (ref 0.7–3.1)
Lymphs: 30 %
MCH: 29.4 pg (ref 26.6–33.0)
MCHC: 34.3 g/dL (ref 31.5–35.7)
MCV: 86 fL (ref 79–97)
Monocytes Absolute: 0.4 10*3/uL (ref 0.1–0.9)
Monocytes: 7 %
Neutrophils Absolute: 3.5 10*3/uL (ref 1.4–7.0)
Neutrophils: 60 %
Platelets: 260 10*3/uL (ref 150–450)
RBC: 4.19 x10E6/uL (ref 3.77–5.28)
RDW: 12.6 % (ref 11.7–15.4)
WBC: 5.8 10*3/uL (ref 3.4–10.8)

## 2018-12-25 LAB — COMPREHENSIVE METABOLIC PANEL
ALT: 20 IU/L (ref 0–32)
AST: 24 IU/L (ref 0–40)
Albumin/Globulin Ratio: 1 — ABNORMAL LOW (ref 1.2–2.2)
Albumin: 3.8 g/dL (ref 3.8–4.9)
Alkaline Phosphatase: 88 IU/L (ref 39–117)
BUN/Creatinine Ratio: 12 (ref 9–23)
BUN: 9 mg/dL (ref 6–24)
Bilirubin Total: 0.3 mg/dL (ref 0.0–1.2)
CO2: 27 mmol/L (ref 20–29)
Calcium: 9.3 mg/dL (ref 8.7–10.2)
Chloride: 102 mmol/L (ref 96–106)
Creatinine, Ser: 0.78 mg/dL (ref 0.57–1.00)
GFR calc Af Amer: 98 mL/min/{1.73_m2} (ref >59)
GFR calc non Af Amer: 85 mL/min/{1.73_m2} (ref >59)
Globulin, Total: 3.7 g/dL (ref 1.5–4.5)
Glucose: 151 mg/dL — ABNORMAL HIGH (ref 65–99)
Potassium: 3.5 mmol/L (ref 3.5–5.2)
Sodium: 142 mmol/L (ref 134–144)
Total Protein: 7.5 g/dL (ref 6.0–8.5)

## 2018-12-25 LAB — HEMOGLOBIN A1C
Est. average glucose Bld gHb Est-mCnc: 111 mg/dL
Hgb A1c MFr Bld: 5.5 % (ref 4.8–5.6)

## 2018-12-25 LAB — TSH: TSH: 1.39 u[IU]/mL (ref 0.450–4.500)

## 2018-12-27 ENCOUNTER — Telehealth: Payer: Self-pay | Admitting: *Deleted

## 2018-12-27 NOTE — Telephone Encounter (Signed)
Patient c/o fatigue and weakness. She has concerns that her Iron and Vit D are low. Can we add these labs on? Scheduled an appt for 01/04/2019 to f/u with PCP. Please advise.    Notes recorded by Carilyn Goodpasture, RN on 12/27/2018 at 2:59 PM EST  Pt name and DOB verified. Patient aware of results and result note. Patient concerns and questions will be forward to last evaluating provider.  ------   Notes recorded by Argentina Donovan, PA-C on 12/27/2018 at 8:44 AM EST  Your labs are normal other tha elevated blood sugar. No diabetes. Your blood sugar was elevated but this was likely related to something you ate. Decrease sugar intake. Blood count was good. Thyroid was normal. Kidney and liver function normal. Follow-up as planned. Thanks, Freeman Caldron, PA-C

## 2018-12-28 NOTE — Telephone Encounter (Signed)
Please call patient.  Her hemoglobin is normal which means her Iron levels are also normal.  They will have to draw her Vitamin D and any other things she is concerned with at her follow-up appt.  Thanks, Freeman Caldron, PA-C

## 2019-01-04 ENCOUNTER — Ambulatory Visit: Payer: Self-pay | Admitting: Nurse Practitioner

## 2019-01-04 NOTE — Telephone Encounter (Signed)
Patient informed of message per Freeman Caldron, PA-C. Patient verbalized understanding. Aware she has an appointment in Jan. Will follow up then.

## 2019-01-24 MED FILL — ?HYDROCHLOROTHIAZIDE 25MG T: 25 | 30 days supply | Qty: 30 | Fill #1

## 2019-01-24 MED FILL — ?AMLODIPINE BESYLATE 10 MG: 10 | 30 days supply | Qty: 30 | Fill #1

## 2019-02-07 ENCOUNTER — Ambulatory Visit: Payer: Self-pay | Admitting: Nurse Practitioner

## 2019-02-15 ENCOUNTER — Ambulatory Visit: Payer: Self-pay | Attending: Nurse Practitioner | Admitting: Nurse Practitioner

## 2019-02-15 ENCOUNTER — Other Ambulatory Visit: Payer: Self-pay

## 2019-02-15 ENCOUNTER — Encounter: Payer: Self-pay | Admitting: Nurse Practitioner

## 2019-02-15 VITALS — BP 110/76 | HR 80 | Temp 98.8°F | Ht 61.0 in | Wt 198.0 lb

## 2019-02-15 DIAGNOSIS — Z1211 Encounter for screening for malignant neoplasm of colon: Secondary | ICD-10-CM

## 2019-02-15 DIAGNOSIS — F419 Anxiety disorder, unspecified: Secondary | ICD-10-CM

## 2019-02-15 DIAGNOSIS — I10 Essential (primary) hypertension: Secondary | ICD-10-CM

## 2019-02-15 MED ORDER — HYDROXYZINE PAMOATE 50 MG PO CAPS: 50.0000 mg | ORAL_CAPSULE | Freq: Three times a day (TID) | ORAL | 0 refills | Status: DC | PRN

## 2019-02-15 MED ORDER — AMLODIPINE BESYLATE 10 MG PO TABS: ORAL_TABLET | ORAL | 1 refills | Status: DC

## 2019-02-15 MED ORDER — HYDROCHLOROTHIAZIDE 25 MG PO TABS: 25.0000 mg | ORAL_TABLET | Freq: Every day | ORAL | 1 refills | Status: DC

## 2019-02-15 MED FILL — HYDROXYZINE PAM 50 MG CAP: 50 | 20 days supply | Qty: 60 | Fill #0

## 2019-02-15 NOTE — Progress Notes (Signed)
Assessment & Plan:  Nina Hopkins was seen today for follow-up.  Diagnoses and all orders for this visit:  Essential hypertension -     amLODipine (NORVASC) 10 MG tablet; TAKE 1 TABLET (10 MG TOTAL) BY MOUTH DAILY. -     hydrochlorothiazide (HYDRODIURIL) 25 MG tablet; Take 1 tablet (25 mg total) by mouth daily.  Anxiety -     hydrOXYzine (VISTARIL) 50 MG capsule; Take 1 capsule (50 mg total) by mouth 3 (three) times daily as needed.  Colon cancer screening -     Fecal occult blood, imunochemical([facility])    Patient has been counseled on age-appropriate routine health concerns for screening and prevention. These are reviewed and up-to-date. Referrals have been placed accordingly. Immunizations are up-to-date or declined.    Subjective:   Chief Complaint  Patient presents with  . Follow-up    Pt. is here for hypertension.    HPI Nina Hopkins 59 y.o. female presents to office today for follow up.   has a past medical history of Anemia, Anxiety (~ 2008), Dyspnea, Family history of adverse reaction to anesthesia, History of blood transfusion (12/20/2014), Hyperlipidemia, Hypertension, Missed ab, and SVD (spontaneous vaginal delivery).   HEALTH MAINTENANCE Referred to the [facility] for mammogram scheduling.  PAP discontinued: S/P TAH 03-2017 Colonoscopy Discontinued: Uninsured. FIT test results pending.   Essential Hypertension  Blood pressure is well controlled. Denies chest pain, shortness of breath, palpitations, lightheadedness, dizziness, headaches or BLE edema. She is working on losing weight and eating healthier. Weight is down. Taking amlodipine 10 mg and HCTZ 25 mg daily as prescribed.  BP Readings from Last 3 Encounters:  02/15/19 110/76  03/23/18 (!) 142/92  11/17/17 123/83   Anxiety Symptoms are controlled. She takes hydroxyzine sparingly.    Review of Systems  Constitutional: Negative for fever, malaise/fatigue and weight loss.  HENT: Negative.   Negative for nosebleeds.   Eyes: Negative.  Negative for blurred vision, double vision and photophobia.  Respiratory: Negative.  Negative for cough and shortness of breath.   Cardiovascular: Negative.  Negative for chest pain, palpitations and leg swelling.  Gastrointestinal: Negative.  Negative for heartburn, nausea and vomiting.  Musculoskeletal: Negative.  Negative for myalgias.  Neurological: Negative.  Negative for dizziness, focal weakness, seizures and headaches.  Psychiatric/Behavioral: Negative for suicidal ideas. The patient is nervous/anxious.     Past Medical History:  Diagnosis Date  . Anemia   . Anxiety ~ 2008   no meds  . Dyspnea    hx - r/t anemia per patient  . Family history of adverse reaction to anesthesia    "my sister had a hard time waking up"  . History of blood transfusion 12/20/2014   related to anemia  . Hyperlipidemia   . Hypertension   . Missed ab    x 1 - no surgery required  . SVD (spontaneous vaginal delivery)    x 2    Past Surgical History:  Procedure Laterality Date  . ABDOMINAL HYSTERECTOMY N/A 03/17/2017   Procedure: HYSTERECTOMY ABDOMINAL;  Surgeon: Emily Filbert, MD;  Location: [facility];  Service: Gynecology;  Laterality: N/A;  . CERVIX LESION DESTRUCTION     cryro  . CYSTOSCOPY N/A 03/17/2017   Procedure: CYSTOSCOPY;  Surgeon: Emily Filbert, MD;  Location: [facility];  Service: Gynecology;  Laterality: N/A;  . DILATION AND CURETTAGE, DIAGNOSTIC / THERAPEUTIC      in office  . SALPINGOOPHORECTOMY Bilateral 03/17/2017   Procedure: SALPINGO OOPHORECTOMY;  Surgeon: Clovia Cuff  C, MD;  Location: [facility];  Service: Gynecology;  Laterality: Bilateral;  . TUBAL LIGATION  1990's    Family History  Problem Relation Age of Onset  . Cancer Mother        BRAIN TUMOR, VULVAR  . Hypertension Mother   . Diabetes Mother   . Cancer Father        PENILE  . Cancer Maternal Grandmother        LUNG  . Cancer Maternal Grandfather        COLON    Social  History Reviewed with no changes to be made today.   Outpatient Medications Prior to Visit  Medication Sig Dispense Refill  . amLODipine (NORVASC) 10 MG tablet TAKE 1 TABLET (10 MG TOTAL) BY MOUTH DAILY. 30 tablet 5  . hydrochlorothiazide (HYDRODIURIL) 25 MG tablet Take 1 tablet (25 mg total) by mouth daily. 30 tablet 5  . busPIRone (BUSPAR) 15 MG tablet Take 1 tablet (15 mg total) by mouth 3 (three) times daily. (Patient not taking: Reported on 02/15/2019) 60 tablet 1   No facility-administered medications prior to visit.    Allergies  Allergen Reactions  . Prilosec [Omeprazole]     Stiff neck       Objective:    BP 110/76 (BP Location: Left Arm, Patient Position: Sitting, Cuff Size: Normal)   Pulse 80   Temp 98.8 F (37.1 C) (Oral)   Ht 5\' 1"  (1.549 m)   Wt 198 lb (89.8 kg)   LMP 03/02/2017 (Approximate)   SpO2 98%   BMI 37.41 kg/m  Wt Readings from Last 3 Encounters:  02/15/19 198 lb (89.8 kg)  03/23/18 199 lb 6.4 oz (90.4 kg)  11/17/17 191 lb 12.8 oz (87 kg)    Physical Exam Vitals and nursing note reviewed.  Constitutional:      Appearance: She is well-developed.  HENT:     Head: Normocephalic and atraumatic.  Cardiovascular:     Rate and Rhythm: Normal rate and regular rhythm.     Heart sounds: Normal heart sounds. No murmur. No friction rub. No gallop.   Pulmonary:     Effort: Pulmonary effort is normal. No tachypnea or respiratory distress.     Breath sounds: Normal breath sounds. No decreased breath sounds, wheezing, rhonchi or rales.  Chest:     Chest wall: No tenderness.  Abdominal:     General: Bowel sounds are normal.     Palpations: Abdomen is soft.  Musculoskeletal:        General: Normal range of motion.     Cervical back: Normal range of motion.  Skin:    General: Skin is warm and dry.  Neurological:     Mental Status: She is alert and oriented to person, place, and time.     Coordination: Coordination normal.  Psychiatric:         Behavior: Behavior normal. Behavior is cooperative.        Thought Content: Thought content normal.        Judgment: Judgment normal.          Patient has been counseled extensively about nutrition and exercise as well as the importance of adherence with medications and regular follow-up. The patient was given clear instructions to go to ER or return to medical center if symptoms don't improve, worsen or new problems develop. The patient verbalized understanding.   Follow-up: Return in about 3 months (around 05/16/2019).   Gildardo Pounds, FNP-BC [facility] and  [place], [place]   02/15/2019, 7:58 PM

## 2019-02-15 NOTE — Patient Instructions (Signed)
Preventive Care 57-57 Years Old, Female Preventive care refers to visits with your health care provider and lifestyle choices that can promote health and wellness. This includes:  A yearly physical exam. This may also be called an annual well check.  Regular dental visits and eye exams.  Immunizations.  Screening for certain conditions.  Healthy lifestyle choices, such as eating a healthy diet, getting regular exercise, not using drugs or products that contain nicotine and tobacco, and limiting alcohol use. What can I expect for my preventive care visit? Physical exam Your health care provider will check your:  Height and weight. This may be used to calculate body mass index (BMI), which tells if you are at a healthy weight.  Heart rate and blood pressure.  Skin for abnormal spots. Counseling Your health care provider may ask you questions about your:  Alcohol, tobacco, and drug use.  Emotional well-being.  Home and relationship well-being.  Sexual activity.  Eating habits.  Work and work Statistician.  Method of birth control.  Menstrual cycle.  Pregnancy history. What immunizations do I need?  Influenza (flu) vaccine  This is recommended every year. Tetanus, diphtheria, and pertussis (Tdap) vaccine  You may need a Td booster every 10 years. Varicella (chickenpox) vaccine  You may need this if you have not been vaccinated. Zoster (shingles) vaccine  You may need this after age 57. Measles, mumps, and rubella (MMR) vaccine  You may need at least one dose of MMR if you were born in 1955 or later. You may also need a second dose. Pneumococcal conjugate (PCV13) vaccine  You may need this if you have certain conditions and were not previously vaccinated. Pneumococcal polysaccharide (PPSV23) vaccine  You may need one or two doses if you smoke cigarettes or if you have certain conditions. Meningococcal conjugate (MenACWY) vaccine  You may need this if you  have certain conditions. Hepatitis A vaccine  You may need this if you have certain conditions or if you travel or work in places where you may be exposed to hepatitis A. Hepatitis B vaccine  You may need this if you have certain conditions or if you travel or work in places where you may be exposed to hepatitis B. Haemophilus influenzae type b (Hib) vaccine  You may need this if you have certain conditions. Human papillomavirus (HPV) vaccine  If recommended by your health care provider, you may need three doses over 6 months. You may receive vaccines as individual doses or as more than one vaccine together in one shot (combination vaccines). Talk with your health care provider about the risks and benefits of combination vaccines. What tests do I need? Blood tests  Lipid and cholesterol levels. These may be checked every 5 years, or more frequently if you are over 11 years old.  Hepatitis C test.  Hepatitis B test. Screening  Lung cancer screening. You may have this screening every year starting at age 57 if you have a 30-pack-year history of smoking and currently smoke or have quit within the past 15 years.  Colorectal cancer screening. All adults should have this screening starting at age 57 and continuing until age 57. Your health care provider may recommend screening at age 57 if you are at increased risk. You will have tests every 1-10 years, depending on your results and the type of screening test.  Diabetes screening. This is done by checking your blood sugar (glucose) after you have not eaten for a while (fasting). You may have this  done every 1-3 years.  Mammogram. This may be done every 1-2 years. Talk with your health care provider about when you should start having regular mammograms. This may depend on whether you have a family history of breast cancer.  BRCA-related cancer screening. This may be done if you have a family history of breast, ovarian, tubal, or peritoneal  cancers.  Pelvic exam and Pap test. This may be done every 3 years starting at age 57. Starting at age 57, this may be done every 5 years if you have a Pap test in combination with an HPV test. Other tests  Sexually transmitted disease (STD) testing.  Bone density scan. This is done to screen for osteoporosis. You may have this scan if you are at high risk for osteoporosis. Follow these instructions at home: Eating and drinking  Eat a diet that includes fresh fruits and vegetables, whole grains, lean protein, and low-fat dairy.  Take vitamin and mineral supplements as recommended by your health care provider.  Do not drink alcohol if: ? Your health care provider tells you not to drink. ? You are pregnant, may be pregnant, or are planning to become pregnant.  If you drink alcohol: ? Limit how much you have to 0-1 drink a day. ? Be aware of how much alcohol is in your drink. In the [place] one drink equals one 12 oz bottle of beer (355 mL), one 5 oz glass of wine (148 mL), or one 1 oz glass of hard liquor (44 mL). Lifestyle  Take daily care of your teeth and gums.  Stay active. Exercise for at least 30 minutes on 5 or more days each week.  Do not use any products that contain nicotine or tobacco, such as cigarettes, e-cigarettes, and chewing tobacco. If you need help quitting, ask your health care provider.  If you are sexually active, practice safe sex. Use a condom or other form of birth control (contraception) in order to prevent pregnancy and STIs (sexually transmitted infections).  If told by your health care provider, take low-dose aspirin daily starting at age 1. What's next?  Visit your health care provider once a year for a well check visit.  Ask your health care provider how often you should have your eyes and teeth checked.  Stay up to date on all vaccines. This information is not intended to replace advice given to you by your health care provider. Make sure you  discuss any questions you have with your health care provider. Document Revised: 10/01/2017 Document Reviewed: 10/01/2017 Elsevier Patient Education  2020 [employer].

## 2019-02-17 ENCOUNTER — Ambulatory Visit: Payer: Self-pay | Attending: Nurse Practitioner

## 2019-02-17 ENCOUNTER — Other Ambulatory Visit: Payer: Self-pay

## 2019-02-18 ENCOUNTER — Other Ambulatory Visit (HOSPITAL_COMMUNITY): Payer: Self-pay | Admitting: *Deleted

## 2019-02-18 DIAGNOSIS — Z1231 Encounter for screening mammogram for malignant neoplasm of breast: Secondary | ICD-10-CM

## 2019-02-18 MED FILL — ?AMLODIPINE BESYLATE 10 MG: 10 | 30 days supply | Qty: 30 | Fill #2

## 2019-02-18 MED FILL — ?HYDROCHLOROTHIAZIDE 25MG T: 25 | 30 days supply | Qty: 30 | Fill #2

## 2019-03-30 MED FILL — ?AMLODIPINE BESYL 10MG TABL: 10 | 30 days supply | Qty: 30 | Fill #3

## 2019-03-30 MED FILL — ?HYDROCHLOROTHIAZIDE 25MG T: 25 | 30 days supply | Qty: 30 | Fill #3

## 2019-04-26 ENCOUNTER — Ambulatory Visit: Payer: Self-pay

## 2019-04-28 ENCOUNTER — Telehealth: Payer: Self-pay | Admitting: Nurse Practitioner

## 2019-04-28 NOTE — Telephone Encounter (Signed)
Pt mom has covid she want to know should she get tested

## 2019-04-28 NOTE — Telephone Encounter (Signed)
Called pt / advised to get tested due to exposure to family member who tested positive/

## 2019-05-04 MED FILL — ?HYDROCHLOROTHIAZIDE 25MG T: 25 | 30 days supply | Qty: 30 | Fill #4

## 2019-05-04 MED FILL — AMLODIPINE BESYLATE 10 MG T: 10 | 30 days supply | Qty: 30 | Fill #4

## 2019-05-16 ENCOUNTER — Other Ambulatory Visit: Payer: Self-pay

## 2019-05-16 ENCOUNTER — Ambulatory Visit: Payer: Self-pay | Attending: Nurse Practitioner | Admitting: Nurse Practitioner

## 2019-05-16 ENCOUNTER — Encounter: Payer: Self-pay | Admitting: Nurse Practitioner

## 2019-05-16 DIAGNOSIS — F419 Anxiety disorder, unspecified: Secondary | ICD-10-CM

## 2019-05-16 DIAGNOSIS — I10 Essential (primary) hypertension: Secondary | ICD-10-CM

## 2019-05-16 NOTE — Progress Notes (Signed)
Virtual Visit via Telephone Note Due to national recommendations of social distancing due to [place] 19, telehealth visit is felt to be most appropriate for this patient at this time.  I discussed the limitations, risks, security and privacy concerns of performing an evaluation and management service by telephone and the availability of in person appointments. I also discussed with the patient that there may be a patient responsible charge related to this service. The patient expressed understanding and agreed to proceed.    I connected with Nina Hopkins on 05/16/19  at   3:50 PM EDT  EDT by telephone and verified that I am speaking with the correct person using two identifiers.   Consent I discussed the limitations, risks, security and privacy concerns of performing an evaluation and management service by telephone and the availability of in person appointments. I also discussed with the patient that there may be a patient responsible charge related to this service. The patient expressed understanding and agreed to proceed.   Location of Patient: Private Residence    Location of Provider: [place] and [employer] Office    Persons participating in Telemedicine visit: [name] FNP-BC [place]    History of Present Illness: Telemedicine visit for: Follow Up  Essential Hypertension She does not have any recent home blood pressure readings to go over today.  Unfortunately her mother passed over the weekend and she has been out of town and not monitoring her blood pressure.  She endorses medication adherence taking amlodipine 10 mg daily and hydrochlorothiazide 25 mg daily. Denies chest pain, shortness of breath, palpitations, lightheadedness, dizziness, headaches or BLE edema BP Readings from Last 3 Encounters:  02/15/19 110/76  03/23/18 (!) 142/92  11/17/17 123/83   Anxiety Well controlled with hydroxzyine 50 mg TID. She takes  sparingly.    Past Medical History:  Diagnosis Date  . Anemia   . Anxiety ~ 2008   no meds  . Dyspnea    hx - r/t anemia per patient  . Family history of adverse reaction to anesthesia    "my sister had a hard time waking up"  . History of blood transfusion 12/20/2014   related to anemia  . Hyperlipidemia   . Hypertension   . Missed ab    x 1 - no surgery required  . SVD (spontaneous vaginal delivery)    x 2    Past Surgical History:  Procedure Laterality Date  . ABDOMINAL HYSTERECTOMY N/A 03/17/2017   Procedure: HYSTERECTOMY ABDOMINAL;  Surgeon: Emily Filbert, MD;  Location: [facility];  Service: Gynecology;  Laterality: N/A;  . CERVIX LESION DESTRUCTION     cryro  . CYSTOSCOPY N/A 03/17/2017   Procedure: CYSTOSCOPY;  Surgeon: Emily Filbert, MD;  Location: [facility];  Service: Gynecology;  Laterality: N/A;  . DILATION AND CURETTAGE, DIAGNOSTIC / THERAPEUTIC      in office  . SALPINGOOPHORECTOMY Bilateral 03/17/2017   Procedure: SALPINGO OOPHORECTOMY;  Surgeon: Emily Filbert, MD;  Location: [facility];  Service: Gynecology;  Laterality: Bilateral;  . TUBAL LIGATION  1990's    Family History  Problem Relation Age of Onset  . Cancer Mother        BRAIN TUMOR, VULVAR  . Hypertension Mother   . Diabetes Mother   . Cancer Father        PENILE  . Cancer Maternal Grandmother        LUNG  . Cancer Maternal Grandfather  COLON    Social History   Socioeconomic History  . Marital status: Divorced    Spouse name: Not on file  . Number of children: Not on file  . Years of education: Not on file  . Highest education level: Not on file  Occupational History  . Not on file  Tobacco Use  . Smoking status: Never Smoker  . Smokeless tobacco: Never Used  Substance and Sexual Activity  . Alcohol use: Yes    Comment: occasionally   . Drug use: No  . Sexual activity: Not Currently    Birth control/protection: None  Other Topics Concern  . Not on file  Social History Narrative  .  Not on file   Social Determinants of Health   Financial Resource Strain:   . Difficulty of Paying Living Expenses:   Food Insecurity:   . Worried About Charity fundraiser in the Last Year:   . Arboriculturist in the Last Year:   Transportation Needs:   . Film/video editor (Medical):   Marland Kitchen Lack of Transportation (Non-Medical):   Physical Activity:   . Days of Exercise per Week:   . Minutes of Exercise per Session:   Stress:   . Feeling of Stress :   Social Connections:   . Frequency of Communication with Friends and Family:   . Frequency of Social Gatherings with Friends and Family:   . Attends Religious Services:   . Active Member of Clubs or Organizations:   . Attends Archivist Meetings:   Marland Kitchen Marital Status:      Observations/Objective: Awake, alert and oriented x 3   Review of Systems  Constitutional: Negative for fever, malaise/fatigue and weight loss.  HENT: Negative.  Negative for nosebleeds.   Eyes: Negative.  Negative for blurred vision, double vision and photophobia.  Respiratory: Negative.  Negative for cough and shortness of breath.   Cardiovascular: Negative.  Negative for chest pain, palpitations and leg swelling.  Gastrointestinal: Negative.  Negative for heartburn, nausea and vomiting.  Musculoskeletal: Negative.  Negative for myalgias.  Neurological: Negative.  Negative for dizziness, focal weakness, seizures and headaches.  Psychiatric/Behavioral: Negative for suicidal ideas. The patient is nervous/anxious.     Assessment and Plan: Nina Hopkins was seen today for follow-up.  Diagnoses and all orders for this visit:  Essential hypertension Continue all antihypertensives as prescribed.  Remember to bring in your blood pressure log with you for your follow up appointment.  DASH/Mediterranean Diets are healthier choices for HTN.    Anxiety Continue vistaril 25 mg TID prn anxiety    HEALTH MAINTENANCE Mammogram-She states she will call to  schedule  Follow Up Instructions Return in about 3 months (around 08/15/2019).     I discussed the assessment and treatment plan with the patient. The patient was provided an opportunity to ask questions and all were answered. The patient agreed with the plan and demonstrated an understanding of the instructions.   The patient was advised to call back or seek an in-person evaluation if the symptoms worsen or if the condition fails to improve as anticipated.  I provided 14 minutes of non-face-to-face time during this encounter including median intraservice time, reviewing previous notes, labs, imaging, medications and explaining diagnosis and management.  Gildardo Pounds, FNP-BC

## 2019-05-27 MED FILL — HYDROCHLOROTHIAZIDE 25 MG T: 25 | 30 days supply | Qty: 30 | Fill #5

## 2019-05-27 MED FILL — AMLODIPINE BESYLATE 10 MG T: 10 | 30 days supply | Qty: 30 | Fill #5

## 2019-06-07 ENCOUNTER — Other Ambulatory Visit: Payer: Self-pay

## 2019-06-30 MED FILL — IBUPROFEN 400 MG TABLET: 400 | 10 days supply | Qty: 30 | Fill #0

## 2019-06-30 MED FILL — CHLORHEXIDINE 0.12% RINSE: 0.12 | 14 days supply | Qty: 473 | Fill #0

## 2019-06-30 MED FILL — ?AMOXICILLIN 500 MG CAPS: 500 | 7 days supply | Qty: 21 | Fill #0

## 2019-07-11 ENCOUNTER — Ambulatory Visit: Payer: Self-pay | Admitting: Nurse Practitioner

## 2019-07-11 MED FILL — ?AMLODIPINE BESYL 10MG TABL: 10 | 30 days supply | Qty: 30 | Fill #0

## 2019-07-13 ENCOUNTER — Other Ambulatory Visit: Payer: Self-pay

## 2019-07-13 DIAGNOSIS — Z1231 Encounter for screening mammogram for malignant neoplasm of breast: Secondary | ICD-10-CM

## 2019-07-17 ENCOUNTER — Other Ambulatory Visit: Payer: Self-pay | Admitting: Nurse Practitioner

## 2019-07-17 DIAGNOSIS — D500 Iron deficiency anemia secondary to blood loss (chronic): Secondary | ICD-10-CM

## 2019-07-17 DIAGNOSIS — R7309 Other abnormal glucose: Secondary | ICD-10-CM

## 2019-07-17 DIAGNOSIS — E785 Hyperlipidemia, unspecified: Secondary | ICD-10-CM

## 2019-07-17 DIAGNOSIS — I10 Essential (primary) hypertension: Secondary | ICD-10-CM

## 2019-07-19 ENCOUNTER — Other Ambulatory Visit: Payer: Self-pay

## 2019-07-28 ENCOUNTER — Other Ambulatory Visit: Payer: Self-pay

## 2019-07-28 ENCOUNTER — Ambulatory Visit
Admission: RE | Admit: 2019-07-28 | Discharge: 2019-07-28 | Disposition: A | Discharge status: Home / Self Care | Payer: No Typology Code available for payment source | Source: Ambulatory Visit | Attending: Obstetrics and Gynecology | Admitting: Obstetrics and Gynecology

## 2019-07-28 ENCOUNTER — Ambulatory Visit: Payer: Self-pay | Admitting: *Deleted

## 2019-07-28 VITALS — BP 114/77 | Temp 97.5°F | Wt 205.0 lb

## 2019-07-28 DIAGNOSIS — Z1231 Encounter for screening mammogram for malignant neoplasm of breast: Secondary | ICD-10-CM

## 2019-07-28 DIAGNOSIS — Z1239 Encounter for other screening for malignant neoplasm of breast: Secondary | ICD-10-CM

## 2019-07-28 NOTE — Patient Instructions (Addendum)
Informed Nina Hopkins about breast self awareness and gave her educational materials to take home. Patient did not need a Pap smear today due to her having a hysterectomy. Referred patient to the [facility] for screening mammogram. Appointment scheduled for July 28, 2019 at 9:30am on the mobile unit. Patient aware of appointment and was escorted to mobile unit. Let patient know the [facility] Center will follow up with her within the next couple weeks with results of her mammogram by letter or phone. [place] verbalized understanding.  [name], RN, FNP student 9:25 AM

## 2019-07-28 NOTE — Progress Notes (Signed)
Nina Hopkins is a 59 y.o. female who presents to [facility] clinic today with no complaints. She is here for a CBE and screening mammogram.    Pap Smear: Pap not smear completed today due to patient having a hysterectomy. Last Pap smear was in November 2018 and was normal per patient. Per patient has no history of an abnormal Pap smear. Last Pap smear result is available in Epic. Patient needs no further Pap smears due to her having a hysterectomy.   Physical exam: Breasts Breasts symmetrical. No skin abnormalities bilateral breasts. No nipple retraction bilateral breasts. No nipple discharge bilateral breasts. No lymphadenopathy. No lumps palpated bilateral breasts. No complaints of pain or tenderness on exam.       Pelvic/Bimanual Pap is not indicated today.   Smoking History: Patient has never smoked.   Patient Navigation: Patient education provided. Access to services provided for patient through [facility] program.   Colorectal Cancer Screening: Patient has never had a colonoscopy completed. Patient completed a FIT test on 07/10/2017 and was negative. No complaints today.    Breast and Cervical Cancer Risk Assessment: Patient has family history of breast cancer in her maternal aunt. No known genetic mutations or history of radiation treatment to the chest before age 11. Patient has no history of cervical dysplasia, immunocompromised, or DES exposure in-utero.  Risk Assessment    Risk Scores      07/28/2019 06/25/2017   Last edited by: Loletta Parish, RN [name], [name], RN   5-year risk: 1.4 % 1.4 %   Lifetime risk: 7.5 % 7.8 %          A: BCCCP exam without pap smear No complaints today.  P: Referred patient to the [facility] for a screening mammogram. Appointment scheduled July 28, 2019 at 9:30am on the mobile unit.  [name], RN, FNP student 07/28/2019 9:04 AM   Attestation of Supervision of Student:  I confirm that I have  verified the information documented in the nurse practitioner students note and that I have also personally reperformed the history, physical exam and all medical decision making activities.  I have verified that all services and findings are accurately documented in this student's note; and I agree with management and plan as outlined in the documentation. I have also made any necessary editorial changes.  Last Pap smear was in November 2018 and normal per patient. Patient had a Pap smear 10/26/2015 in [facility] and normal with negative HPV. Per patient has no history of an abnormal Pap smear. Patient has a history of a hysterectomy 03/17/2017 due to AUB. Patient doesn't need any further Pap smears due to her history of a hysterectomy for benign reasons per BCCCP and ASCCP guidelines. Last Pap smear result is not in Epic. Previous Pap smear 10/26/2015 and hysterectomy results are in [place].   [name], [place] for [employer], [place] 07/28/2019 9:31 AM

## 2019-08-10 ENCOUNTER — Other Ambulatory Visit: Payer: Self-pay

## 2019-08-10 ENCOUNTER — Ambulatory Visit: Payer: Self-pay | Attending: Nurse Practitioner | Admitting: Nurse Practitioner

## 2019-08-10 ENCOUNTER — Encounter: Payer: Self-pay | Admitting: Nurse Practitioner

## 2019-08-10 DIAGNOSIS — E785 Hyperlipidemia, unspecified: Secondary | ICD-10-CM

## 2019-08-10 DIAGNOSIS — D500 Iron deficiency anemia secondary to blood loss (chronic): Secondary | ICD-10-CM

## 2019-08-10 DIAGNOSIS — R7309 Other abnormal glucose: Secondary | ICD-10-CM

## 2019-08-10 DIAGNOSIS — I10 Essential (primary) hypertension: Secondary | ICD-10-CM

## 2019-08-10 MED ORDER — AMLODIPINE BESYLATE 10 MG PO TABS: ORAL_TABLET | ORAL | 1 refills | Status: DC

## 2019-08-10 MED ORDER — HYDROCHLOROTHIAZIDE 25 MG PO TABS: 25.0000 mg | ORAL_TABLET | Freq: Every day | ORAL | 1 refills | Status: DC

## 2019-08-10 NOTE — Progress Notes (Signed)
Assessment & Plan:  Nina Hopkins was seen today for blood pressure check.  Diagnoses and all orders for this visit:  Essential hypertension -     hydrochlorothiazide (HYDRODIURIL) 25 MG tablet; Take 1 tablet (25 mg total) by mouth daily. -     amLODipine (NORVASC) 10 MG tablet; TAKE 1 TABLET (10 MG TOTAL) BY MOUTH DAILY. -     CMP14+EGFR Continue all antihypertensives as prescribed.  Remember to bring in your blood pressure log with you for your follow up appointment.  DASH/Mediterranean Diets are healthier choices for HTN.    Dyslipidemia, goal LDL below 70 -     Lipid panel INSTRUCTIONS: Work on a low fat, heart healthy diet and participate in regular aerobic exercise program by working out at least 150 minutes per week; 5 days a week-30 minutes per day. Avoid red meat/beef/steak,  fried foods. junk foods, sodas, sugary drinks, unhealthy snacking, alcohol and smoking.  Drink at least 80 oz of water per day and monitor your carbohydrate intake daily.    Iron deficiency anemia due to chronic blood loss -     CBC  Elevated glucose -     Hemoglobin A1c    Patient has been counseled on age-appropriate routine health concerns for screening and prevention. These are reviewed and up-to-date. Referrals have been placed accordingly. Immunizations are up-to-date or declined.    Subjective:   Chief Complaint  Patient presents with  . Blood Pressure Check    Pt. is here for blood pressure check.   HPI Nina Hopkins 59 y.o. female presents to office today for follow up.   Essential Hypertension Doing well today. She does note elevated DBPs at home around 90s or higher however blood pressures have been well controlled here in the office and most recently at her mammogram visit. Denies chest pain, shortness of breath, palpitations, lightheadedness, dizziness, headaches or BLE edema.  BP Readings from Last 3 Encounters:  08/10/19 110/74  07/28/19 114/77  02/15/19 110/76        Review of Systems  Constitutional: Negative for fever, malaise/fatigue and weight loss.  HENT: Negative.  Negative for nosebleeds.   Eyes: Negative.  Negative for blurred vision, double vision and photophobia.  Respiratory: Negative.  Negative for cough and shortness of breath.   Cardiovascular: Negative.  Negative for chest pain, palpitations and leg swelling.  Gastrointestinal: Negative.  Negative for heartburn, nausea and vomiting.  Musculoskeletal: Negative.  Negative for myalgias.  Neurological: Negative.  Negative for dizziness, focal weakness, seizures and headaches.  Psychiatric/Behavioral: Negative.  Negative for suicidal ideas.    Past Medical History:  Diagnosis Date  . Anemia   . Anxiety ~ 2008   no meds  . Dyspnea    hx - r/t anemia per patient  . Family history of adverse reaction to anesthesia    "my sister had a hard time waking up"  . History of blood transfusion 12/20/2014   related to anemia  . Hyperlipidemia   . Hypertension   . Missed ab    x 1 - no surgery required  . SVD (spontaneous vaginal delivery)    x 2    Past Surgical History:  Procedure Laterality Date  . ABDOMINAL HYSTERECTOMY N/A 03/17/2017   Procedure: HYSTERECTOMY ABDOMINAL;  Surgeon: Emily Filbert, MD;  Location: [facility];  Service: Gynecology;  Laterality: N/A;  . CERVIX LESION DESTRUCTION     cryro  . CYSTOSCOPY N/A 03/17/2017   Procedure: CYSTOSCOPY;  Surgeon: Clovia Cuff  C, MD;  Location: [facility];  Service: Gynecology;  Laterality: N/A;  . DILATION AND CURETTAGE, DIAGNOSTIC / THERAPEUTIC      in office  . SALPINGOOPHORECTOMY Bilateral 03/17/2017   Procedure: SALPINGO OOPHORECTOMY;  Surgeon: Emily Filbert, MD;  Location: [facility];  Service: Gynecology;  Laterality: Bilateral;  . TUBAL LIGATION  1990's    Family History  Problem Relation Age of Onset  . Cancer Mother        BRAIN TUMOR, VULVAR  . Hypertension Mother   . Diabetes Mother   . Cancer Father        PENILE  .  Cancer Maternal Grandmother        LUNG  . Cancer Maternal Grandfather        COLON  . Diabetes Sister   . Hypertension Sister   . Heart disease Sister     Social History Reviewed with no changes to be made today.   Outpatient Medications Prior to Visit  Medication Sig Dispense Refill  . hydrOXYzine (VISTARIL) 50 MG capsule Take 1 capsule (50 mg total) by mouth 3 (three) times daily as needed. 60 capsule 0  . amLODipine (NORVASC) 10 MG tablet TAKE 1 TABLET (10 MG TOTAL) BY MOUTH DAILY. 90 tablet 1  . hydrochlorothiazide (HYDRODIURIL) 25 MG tablet Take 1 tablet (25 mg total) by mouth daily. 90 tablet 1   No facility-administered medications prior to visit.    Allergies  Allergen Reactions  . Prilosec [Omeprazole]     Stiff neck       Objective:    BP 110/74 (BP Location: Left Arm, Patient Position: Sitting, Cuff Size: Large)   Pulse 80   Temp 97.7 F (36.5 C) (Temporal)   Ht 5' 1"  (1.549 m)   Wt 205 lb (93 kg)   LMP 03/02/2017 (Approximate)   SpO2 97%   BMI 38.73 kg/m  Wt Readings from Last 3 Encounters:  08/10/19 205 lb (93 kg)  07/28/19 205 lb (93 kg)  02/15/19 198 lb (89.8 kg)    Physical Exam Vitals and nursing note reviewed.  Constitutional:      Appearance: She is well-developed.  HENT:     Head: Normocephalic and atraumatic.  Cardiovascular:     Rate and Rhythm: Normal rate and regular rhythm.     Heart sounds: Normal heart sounds. No murmur heard.  No friction rub. No gallop.   Pulmonary:     Effort: Pulmonary effort is normal. No tachypnea or respiratory distress.     Breath sounds: Normal breath sounds. No decreased breath sounds, wheezing, rhonchi or rales.  Chest:     Chest wall: No tenderness.  Abdominal:     General: Bowel sounds are normal.     Palpations: Abdomen is soft.  Musculoskeletal:        General: Normal range of motion.     Cervical back: Normal range of motion.  Skin:    General: Skin is warm and dry.  Neurological:      Mental Status: She is alert and oriented to person, place, and time.     Coordination: Coordination normal.  Psychiatric:        Behavior: Behavior normal. Behavior is cooperative.        Thought Content: Thought content normal.        Judgment: Judgment normal.          Patient has been counseled extensively about nutrition and exercise as well as the importance of adherence with medications and  regular follow-up. The patient was given clear instructions to go to ER or return to medical center if symptoms don't improve, worsen or new problems develop. The patient verbalized understanding.   Follow-up: Return in about 3 months (around 11/10/2019) for BP recheck.   Gildardo Pounds, FNP-BC [facility] and [place], [place]   08/10/2019, 6:50 PM

## 2019-08-11 LAB — CMP14+EGFR
ALT: 23 IU/L (ref 0–32)
AST: 21 IU/L (ref 0–40)
Albumin/Globulin Ratio: 1.4 (ref 1.2–2.2)
Albumin: 4.5 g/dL (ref 3.8–4.9)
Alkaline Phosphatase: 82 IU/L (ref 48–121)
BUN/Creatinine Ratio: 14 (ref 9–23)
BUN: 8 mg/dL (ref 6–24)
Bilirubin Total: 0.2 mg/dL (ref 0.0–1.2)
CO2: 27 mmol/L (ref 20–29)
Calcium: 9.5 mg/dL (ref 8.7–10.2)
Chloride: 103 mmol/L (ref 96–106)
Creatinine, Ser: 0.56 mg/dL — ABNORMAL LOW (ref 0.57–1.00)
GFR calc Af Amer: 120 mL/min/{1.73_m2} (ref >59)
GFR calc non Af Amer: 104 mL/min/{1.73_m2} (ref >59)
Globulin, Total: 3.2 g/dL (ref 1.5–4.5)
Glucose: 85 mg/dL (ref 65–99)
Potassium: 3.6 mmol/L (ref 3.5–5.2)
Sodium: 144 mmol/L (ref 134–144)
Total Protein: 7.7 g/dL (ref 6.0–8.5)

## 2019-08-11 LAB — LIPID PANEL
Chol/HDL Ratio: 3.7 ratio (ref 0.0–4.4)
Cholesterol, Total: 228 mg/dL — ABNORMAL HIGH (ref 100–199)
HDL: 61 mg/dL (ref >39)
LDL Chol Calc (NIH): 136 mg/dL — ABNORMAL HIGH (ref 0–99)
Triglycerides: 173 mg/dL — ABNORMAL HIGH (ref 0–149)
VLDL Cholesterol Cal: 31 mg/dL (ref 5–40)

## 2019-08-11 LAB — CBC
Hematocrit: 39.1 % (ref 34.0–46.6)
Hemoglobin: 12.7 g/dL (ref 11.1–15.9)
MCH: 28.9 pg (ref 26.6–33.0)
MCHC: 32.5 g/dL (ref 31.5–35.7)
MCV: 89 fL (ref 79–97)
Platelets: 259 10*3/uL (ref 150–450)
RBC: 4.4 x10E6/uL (ref 3.77–5.28)
RDW: 12.4 % (ref 11.7–15.4)
WBC: 5.3 10*3/uL (ref 3.4–10.8)

## 2019-08-11 LAB — HEMOGLOBIN A1C
Est. average glucose Bld gHb Est-mCnc: 120 mg/dL
Hgb A1c MFr Bld: 5.8 % — ABNORMAL HIGH (ref 4.8–5.6)

## 2019-08-11 MED FILL — ?AMLODIPINE BESYL 10MG TABL: 10 | 30 days supply | Qty: 30 | Fill #1

## 2019-08-11 MED FILL — HYDROCHLOROTHIAZIDE 25 MG T: 25 | 30 days supply | Qty: 30 | Fill #1

## 2019-08-15 ENCOUNTER — Ambulatory Visit: Payer: No Typology Code available for payment source

## 2019-08-19 ENCOUNTER — Ambulatory Visit: Payer: No Typology Code available for payment source

## 2019-08-20 LAB — FECAL OCCULT BLOOD, IMMUNOCHEMICAL: Fecal Occult Bld: NEGATIVE

## 2019-08-25 ENCOUNTER — Other Ambulatory Visit: Payer: Self-pay | Admitting: Nurse Practitioner

## 2019-08-26 LAB — FECAL OCCULT BLOOD, IMMUNOCHEMICAL

## 2019-08-26 LAB — SPECIMEN STATUS REPORT

## 2019-09-16 MED FILL — AMLODIPINE BESYLATE 10 MG T: 10 | 30 days supply | Qty: 30 | Fill #2

## 2019-09-16 MED FILL — HYDROCHLOROTHIAZIDE 25 MG T: 25 | 30 days supply | Qty: 30 | Fill #2

## 2019-10-26 ENCOUNTER — Other Ambulatory Visit: Payer: No Typology Code available for payment source

## 2019-10-27 MED FILL — AMLODIPINE BESYLATE 10 MG T: 10 | 30 days supply | Qty: 30 | Fill #3

## 2019-10-27 MED FILL — HYDROCHLOROTHIAZIDE 25 MG T: 25 | 30 days supply | Qty: 30 | Fill #3

## 2019-11-30 MED FILL — AMLODIPINE BESYLATE 10 MG T: 10 | 30 days supply | Qty: 30 | Fill #4

## 2019-11-30 MED FILL — HYDROCHLOROTHIAZIDE 25 MG T: 25 | 30 days supply | Qty: 30 | Fill #4

## 2020-01-19 MED FILL — AMLODIPINE BESYLATE 10 MG T: 10 | 30 days supply | Qty: 30 | Fill #5

## 2020-01-19 MED FILL — HYDROCHLOROTHIAZIDE 25 MG T: 25 | 30 days supply | Qty: 30 | Fill #5

## 2020-02-28 ENCOUNTER — Other Ambulatory Visit: Payer: Self-pay

## 2020-02-28 ENCOUNTER — Other Ambulatory Visit: Payer: Self-pay | Admitting: Nurse Practitioner

## 2020-02-28 ENCOUNTER — Encounter: Payer: No Typology Code available for payment source | Admitting: Nurse Practitioner

## 2020-02-28 DIAGNOSIS — I10 Essential (primary) hypertension: Secondary | ICD-10-CM

## 2020-02-28 MED ORDER — HYDROCHLOROTHIAZIDE 25 MG PO TABS: 25.0000 mg | ORAL_TABLET | Freq: Every day | ORAL | 0 refills | Status: DC

## 2020-02-28 MED ORDER — AMLODIPINE BESYLATE 10 MG PO TABS: ORAL_TABLET | ORAL | 0 refills | Status: DC

## 2020-02-28 MED FILL — AMLODIPINE BESYLATE 10 MG T: 10 | 30 days supply | Qty: 30 | Fill #0

## 2020-02-28 MED FILL — HYDROCHLOROTHIAZIDE 25 MG T: 25 | 30 days supply | Qty: 30 | Fill #0

## 2020-02-28 NOTE — Telephone Encounter (Signed)
Patient called request refill for blood pressure medication.  Patient scheduled 03/13/2019 for lab work and 03/23/2019 for blood pressure check visit.   Patient was informed CMA will give a courtesy refill last till her lab appt. And BP check.

## 2020-03-12 ENCOUNTER — Ambulatory Visit: Payer: Self-pay | Attending: Nurse Practitioner

## 2020-03-12 ENCOUNTER — Other Ambulatory Visit: Payer: Self-pay

## 2020-03-12 ENCOUNTER — Other Ambulatory Visit: Payer: Self-pay | Admitting: Nurse Practitioner

## 2020-03-12 DIAGNOSIS — E785 Hyperlipidemia, unspecified: Secondary | ICD-10-CM

## 2020-03-12 DIAGNOSIS — D500 Iron deficiency anemia secondary to blood loss (chronic): Secondary | ICD-10-CM

## 2020-03-12 DIAGNOSIS — I10 Essential (primary) hypertension: Secondary | ICD-10-CM

## 2020-03-13 LAB — CMP14+EGFR
ALT: 27 [IU]/L (ref 0–32)
AST: 24 [IU]/L (ref 0–40)
Albumin/Globulin Ratio: 1.6 (ref 1.2–2.2)
Albumin: 4.2 g/dL (ref 3.8–4.9)
Alkaline Phosphatase: 80 [IU]/L (ref 44–121)
BUN/Creatinine Ratio: 14 (ref 9–23)
BUN: 11 mg/dL (ref 6–24)
Bilirubin Total: 0.4 mg/dL (ref 0.0–1.2)
CO2: 23 mmol/L (ref 20–29)
Calcium: 9.2 mg/dL (ref 8.7–10.2)
Chloride: 105 mmol/L (ref 96–106)
Creatinine, Ser: 0.81 mg/dL (ref 0.57–1.00)
GFR calc Af Amer: 93 mL/min/{1.73_m2}
GFR calc non Af Amer: 81 mL/min/{1.73_m2}
Globulin, Total: 2.6 g/dL (ref 1.5–4.5)
Glucose: 112 mg/dL — ABNORMAL HIGH (ref 65–99)
Potassium: 4 mmol/L (ref 3.5–5.2)
Sodium: 143 mmol/L (ref 134–144)
Total Protein: 6.8 g/dL (ref 6.0–8.5)

## 2020-03-13 LAB — CBC
Hematocrit: 36.5 % (ref 34.0–46.6)
Hemoglobin: 12.4 g/dL (ref 11.1–15.9)
MCH: 29.3 pg (ref 26.6–33.0)
MCHC: 34 g/dL (ref 31.5–35.7)
MCV: 86 fL (ref 79–97)
Platelets: 243 10*3/uL (ref 150–450)
RBC: 4.23 x10E6/uL (ref 3.77–5.28)
RDW: 11.9 % (ref 11.7–15.4)
WBC: 4.9 10*3/uL (ref 3.4–10.8)

## 2020-03-13 LAB — LIPID PANEL
Chol/HDL Ratio: 3.8 ratio (ref 0.0–4.4)
Cholesterol, Total: 222 mg/dL — ABNORMAL HIGH (ref 100–199)
HDL: 58 mg/dL
LDL Chol Calc (NIH): 129 mg/dL — ABNORMAL HIGH (ref 0–99)
Triglycerides: 199 mg/dL — ABNORMAL HIGH (ref 0–149)
VLDL Cholesterol Cal: 35 mg/dL (ref 5–40)

## 2020-03-21 NOTE — Progress Notes (Signed)
Nina Hopkins, is a 60 y.o. female  QBV:[id number]  KCM:[id number]  DOB - [birth year]  Subjective:  Chief Complaint and HPI: Nina Hopkins is a 60 y.o. female here today  for a follow up visit for chronic conditions.  Labs good except minimal hyperglycemia and dyslipidemia.  No issues or concerns today.  She wants to avoid a statin.  ED/Hospital notes reviewed.   Social History: Family history:  ROS:   Constitutional:  No f/c, No night sweats, No unexplained weight loss. EENT:  No vision changes, No blurry vision, No hearing changes. No mouth, throat, or ear problems.  Respiratory: No cough, No SOB Cardiac: No CP, no palpitations GI:  No abd pain, No N/V/D. GU: No Urinary s/sx Musculoskeletal: No joint pain Neuro: No headache, no dizziness, no motor weakness.  Skin: No rash Endocrine:  No polydipsia. No polyuria.  Psych: Denies SI/HI  Problem  Prediabetes  Dyslipidemia, Goal Ldl Below 70    ALLERGIES: Allergies  Allergen Reactions  . Prilosec [Omeprazole]     Stiff neck    PAST MEDICAL HISTORY: Past Medical History:  Diagnosis Date  . Anemia   . Anxiety ~ 2008   no meds  . Dyspnea    hx - r/t anemia per patient  . Family history of adverse reaction to anesthesia    "my sister had a hard time waking up"  . History of blood transfusion 12/20/2014   related to anemia  . Hyperlipidemia   . Hypertension   . Missed ab    x 1 - no surgery required  . SVD (spontaneous vaginal delivery)    x 2    MEDICATIONS AT HOME: Prior to Admission medications   Medication Sig Start Date End Date Taking? Authorizing Provider  amLODipine (NORVASC) 10 MG tablet TAKE 1 TABLET (10 MG TOTAL) BY MOUTH DAILY. 03/22/20   Argentina Donovan, PA-C  hydrochlorothiazide (HYDRODIURIL) 25 MG tablet Take 1 tablet (25 mg total) by mouth daily. 03/22/20   Argentina Donovan, PA-C  hydrOXYzine (VISTARIL) 50 MG capsule Take 1 capsule (50 mg total) by mouth 3 (three) times daily as needed.  03/22/20   Argentina Donovan, PA-C     Objective:  EXAM:   Vitals:   03/22/20 0924  BP: 116/78  Pulse: 92  SpO2: 98%  Weight: 206 lb (93.4 kg)  Height: 5\' 1"  (1.549 m)    General appearance : A&OX3. NAD. Non-toxic-appearing HEENT: Atraumatic and Normocephalic.  PERRLA. EOM intact.   Chest/Lungs:  Breathing-non-labored, Good air entry bilaterally, breath sounds normal without rales, rhonchi, or wheezing  CVS: S1 S2 regular, no murmurs, gallops, rubs  Extremities: Bilateral Lower Ext shows no edema, both legs are warm to touch with = pulse throughout Neurology:  CN II-XII grossly intact, Non focal.   Psych:  TP linear. J/I WNL. Normal speech. Appropriate eye contact and affect.  Skin:  No Rash  Data Review Lab Results  Component Value Date   HGBA1C 5.8 (H) 08/10/2019   HGBA1C 5.5 12/24/2018   HGBA1C 5.4 07/10/2017     Assessment & Plan   1. Prediabetes I have had a lengthy discussion and provided education about insulin resistance and the intake of too much sugar/refined carbohydrates.  I have advised the patient to work at a goal of eliminating sugary drinks, candy, desserts, sweets, refined sugars, processed foods, and white carbohydrates.  The patient expresses understanding.  - Hemoglobin A1c  2. Essential hypertension Controlled-continue - amLODipine (NORVASC) 10 MG tablet; TAKE  1 TABLET (10 MG TOTAL) BY MOUTH DAILY.  Dispense: 90 tablet; Refill: 1 - hydrochlorothiazide (HYDRODIURIL) 25 MG tablet; Take 1 tablet (25 mg total) by mouth daily.  Dispense: 90 tablet; Refill: 1  3. Anxiety Stable/use as needed - hydrOXYzine (VISTARIL) 50 MG capsule; Take 1 capsule (50 mg total) by mouth 3 (three) times daily as needed.  Dispense: 60 capsule; Refill: 0  4. Dyslipidemia, goal LDL below 70 Patient may try fish oil.  Dose and information provided.  She declines statin at this time; however, it is likely going to necessary to accomplish goal.    Patient have been counseled  extensively about nutrition and exercise  Return in about 4 months (around 07/20/2020) for PCP;  chronic conditions/consider statin if lifestyle not helping with lipids.  The patient was given clear instructions to go to ER or return to medical center if symptoms don't improve, worsen or new problems develop. The patient verbalized understanding. The patient was told to call to get lab results if they haven't heard anything in the next week.     Freeman Caldron, PA-C [facility] and [place], [facility]   03/22/2020, 9:44 AMPatient ID: Nina Hopkins, female   DOB: 01-01-1961, 60 y.o.   MRN: [id number]

## 2020-03-22 ENCOUNTER — Other Ambulatory Visit: Payer: Self-pay

## 2020-03-22 ENCOUNTER — Ambulatory Visit: Payer: Self-pay | Attending: Nurse Practitioner | Admitting: Physician Assistant

## 2020-03-22 ENCOUNTER — Encounter: Payer: Self-pay | Admitting: Physician Assistant

## 2020-03-22 ENCOUNTER — Other Ambulatory Visit: Payer: Self-pay | Admitting: Physician Assistant

## 2020-03-22 VITALS — BP 116/78 | HR 92 | Ht 61.0 in | Wt 206.0 lb

## 2020-03-22 DIAGNOSIS — R7303 Prediabetes: Secondary | ICD-10-CM

## 2020-03-22 DIAGNOSIS — F419 Anxiety disorder, unspecified: Secondary | ICD-10-CM

## 2020-03-22 DIAGNOSIS — E785 Hyperlipidemia, unspecified: Secondary | ICD-10-CM

## 2020-03-22 DIAGNOSIS — I10 Essential (primary) hypertension: Secondary | ICD-10-CM

## 2020-03-22 MED ORDER — HYDROCHLOROTHIAZIDE 25 MG PO TABS
25.0000 mg | ORAL_TABLET | Freq: Every day | ORAL | 1 refills | Status: DC
Start: 2020-03-22 — End: 2020-03-22

## 2020-03-22 MED ORDER — AMLODIPINE BESYLATE 10 MG PO TABS: ORAL_TABLET | ORAL | 1 refills | Status: DC

## 2020-03-22 MED ORDER — HYDROXYZINE PAMOATE 50 MG PO CAPS: 50.0000 mg | ORAL_CAPSULE | Freq: Three times a day (TID) | ORAL | 0 refills | Status: DC | PRN

## 2020-03-22 MED FILL — HYDROXYZINE PAMOATE 50 MG C: 50 | 30 days supply | Qty: 60 | Fill #0

## 2020-03-22 NOTE — Patient Instructions (Signed)
Drink 80-100 ounces water daily.  Take 3g(or 3000mg  fish oil daily)  Preventing High Cholesterol Cholesterol is a white, waxy substance similar to fat that the human body needs to help build cells. The liver makes all the cholesterol that a person's body needs. Having high cholesterol (hypercholesterolemia) increases your risk for heart disease and stroke. Extra or excess cholesterol comes from the food that you eat. High cholesterol can often be prevented with diet and lifestyle changes. If you already have high cholesterol, you can control it with diet, lifestyle changes, and medicines. How can high cholesterol affect me? If you have high cholesterol, fatty deposits (plaques) may build up on the walls of your blood vessels. The blood vessels that carry blood away from your heart are called arteries. Plaques make the arteries narrower and stiffer. This in turn can:  Restrict or block blood flow and cause blood clots to form.  Increase your risk for heart attack and stroke. What can increase my risk for high cholesterol? This condition is more likely to develop in people who:  Eat foods that are high in saturated fat or cholesterol. Saturated fat is mostly found in foods that come from animal sources.  Are overweight.  Are not getting enough exercise.  Have a family history of high cholesterol (familial hypercholesterolemia). What actions can I take to prevent this? Nutrition  Eat less saturated fat.  Avoid trans fats (partially hydrogenated oils). These are often found in margarine and in some baked goods, fried foods, and snacks bought in packages.  Avoid precooked or cured meat, such as bacon, sausages, or meat loaves.  Avoid foods and drinks that have added sugars.  Eat more fruits, vegetables, and whole grains.  Choose healthy sources of protein, such as fish, poultry, lean cuts of red meat, beans, peas, lentils, and nuts.  Choose healthy sources of fat, such  as: ? Nuts. ? Vegetable oils, especially olive oil. ? Fish that have healthy fats, such as omega-3 fatty acids. These fish include mackerel or salmon.   Lifestyle  Lose weight if you are overweight. Maintaining a healthy body mass index (BMI) can help prevent or control high cholesterol. It can also lower your risk for diabetes and high blood pressure. Ask your health care provider to help you with a diet and exercise plan to lose weight safely.  Do not use any products that contain nicotine or tobacco, such as cigarettes, e-cigarettes, and chewing tobacco. If you need help quitting, ask your health care provider. Alcohol use  Do not drink alcohol if: ? Your health care provider tells you not to drink. ? You are pregnant, may be pregnant, or are planning to become pregnant.  If you drink alcohol: ? Limit how much you use to:  0-1 drink a day for women.  0-2 drinks a day for men. ? Be aware of how much alcohol is in your drink. In the [place] one drink equals one 12 oz bottle of beer (355 mL), one 5 oz glass of wine (148 mL), or one 1 oz glass of hard liquor (44 mL). Activity  Get enough exercise. Do exercises as told by your health care provider.  Each week, do at least 150 minutes of exercise that takes a medium level of effort (moderate-intensity exercise). This kind of exercise: ? Makes your heart beat faster while allowing you to still be able to talk. ? Can be done in short sessions several times a day or longer sessions a few times a week.  For example, on 5 days each week, you could walk fast or ride your bike 3 times a day for 10 minutes each time.   Medicines  Your health care provider may recommend medicines to help lower cholesterol. This may be a medicine to lower the amount of cholesterol that your liver makes. You may need medicine if: ? Diet and lifestyle changes have not lowered your cholesterol enough. ? You have high cholesterol and other risk factors for heart  disease or stroke.  Take over-the-counter and prescription medicines only as told by your health care provider. General information  Manage your risk factors for high cholesterol. Talk with your health care provider about all your risk factors and how to lower your risk.  Manage other conditions that you have, such as diabetes or high blood pressure (hypertension).  Have blood tests to check your cholesterol levels at regular points in time as told by your health care provider.  Keep all follow-up visits as told by your health care provider. This is important. Where to find more information  American Heart Association: www.heart.org  National Heart, Lung, and Blood Institute: https://wilson-eaton.com/ Summary  High cholesterol increases your risk for heart disease and stroke. By keeping your cholesterol level low, you can reduce your risk for these conditions.  High cholesterol can often be prevented with diet and lifestyle changes.  Work with your health care provider to manage your risk factors, and have your blood tested regularly. This information is not intended to replace advice given to you by your health care provider. Make sure you discuss any questions you have with your health care provider. Document Revised: 11/02/2018 Document Reviewed: 11/02/2018 Elsevier Patient Education  [place].

## 2020-03-23 LAB — HEMOGLOBIN A1C
Est. average glucose Bld gHb Est-mCnc: 126 mg/dL
Hgb A1c MFr Bld: 6 % — ABNORMAL HIGH (ref 4.8–5.6)

## 2020-04-02 ENCOUNTER — Ambulatory Visit: Payer: No Typology Code available for payment source | Admitting: Nurse Practitioner

## 2020-04-13 MED FILL — AMLODIPINE BESYLATE 10 MG T: 10 | 30 days supply | Qty: 30 | Fill #0

## 2020-04-13 MED FILL — HYDROCHLOROTHIAZIDE 25 MG T: 25 | 30 days supply | Qty: 30 | Fill #0

## 2020-04-23 ENCOUNTER — Ambulatory Visit: Payer: Self-pay

## 2020-04-23 NOTE — Telephone Encounter (Signed)
  Pt. Has noticed left leg swelling x 2 weeks. Sometimes swelling goes above the knee. Comes and goes. Pain comes and goes. Today pain is 2/10. No redness noted. Pain involves "my whole knee and sometimes I feel a knot behind my knee." Agent made appointment for tomorrow. Instructed pt. To go to ED for worsening of symptoms or chest pain, shortness of breath. Answer Assessment - Initial Assessment Questions 1. ONSET: "When did the swelling start?" (e.g., minutes, hours, days)     2 weeks 2. LOCATION: "What part of the leg is swollen?"  "Are both legs swollen or just one leg?"     Left ankle and up to above the knee 3. SEVERITY: "How bad is the swelling?" (e.g., localized; mild, moderate, severe)  - Localized - small area of swelling localized to one leg  - MILD pedal edema - swelling limited to foot and ankle, pitting edema < 1/4 inch (6 mm) deep, rest and elevation eliminate most or all swelling  - MODERATE edema - swelling of lower leg to knee, pitting edema > 1/4 inch (6 mm) deep, rest and elevation only partially reduce swelling  - SEVERE edema - swelling extends above knee, facial or hand swelling present      Moderate 4. REDNESS: "Does the swelling look red or infected?"     No 5. PAIN: "Is the swelling painful to touch?" If Yes, ask: "How painful is it?"   (Scale 1-10; mild, moderate or severe)     Today - 2 6. FEVER: "Do you have a fever?" If Yes, ask: "What is it, how was it measured, and when did it start?"      No 7. CAUSE: "What do you think is causing the leg swelling?"     Unsure 8. MEDICAL HISTORY: "Do you have a history of heart failure, kidney disease, liver failure, or cancer?"     No 9. RECURRENT SYMPTOM: "Have you had leg swelling before?" If Yes, ask: "When was the last time?" "What happened that time?"     Ankles 10. OTHER SYMPTOMS: "Do you have any other symptoms?" (e.g., chest pain, difficulty breathing)       Anxiety and sometimes shortness of breath recently 11.  PREGNANCY: "Is there any chance you are pregnant?" "When was your last menstrual period?"       No  Protocols used: LEG SWELLING AND EDEMA-A-AH

## 2020-04-24 ENCOUNTER — Ambulatory Visit: Payer: No Typology Code available for payment source | Admitting: Nurse Practitioner

## 2020-05-01 ENCOUNTER — Telehealth: Payer: Self-pay | Admitting: Nurse Practitioner

## 2020-05-01 NOTE — Telephone Encounter (Signed)
FYI/ pt has an appt with Zelda in June and cancelled her appt at [facility] for leg swelling  / due to not wanting to see another office or provider/ Pt is willing to wait until June to see her PCP/ but please advise if she can be seen sooner in this office

## 2020-05-02 ENCOUNTER — Ambulatory Visit: Payer: No Typology Code available for payment source | Admitting: Family

## 2020-05-07 NOTE — Telephone Encounter (Signed)
Viewed Nina Hopkins schedule and she does not have any appts sooner than June.

## 2020-05-16 ENCOUNTER — Other Ambulatory Visit: Payer: Self-pay

## 2020-05-16 MED FILL — Amlodipine Besylate Tab 10 MG (Base Equivalent): ORAL | 30 days supply | Qty: 30 | Fill #0 | Status: AC

## 2020-05-16 MED FILL — Hydrochlorothiazide Tab 25 MG: ORAL | 30 days supply | Qty: 30 | Fill #0 | Status: AC

## 2020-05-17 ENCOUNTER — Other Ambulatory Visit: Payer: Self-pay

## 2020-06-18 ENCOUNTER — Other Ambulatory Visit: Payer: Self-pay

## 2020-06-18 MED FILL — Hydrochlorothiazide Tab 25 MG: ORAL | 30 days supply | Qty: 30 | Fill #1 | Status: AC

## 2020-06-18 MED FILL — Amlodipine Besylate Tab 10 MG (Base Equivalent): ORAL | 30 days supply | Qty: 30 | Fill #1 | Status: AC

## 2020-06-19 ENCOUNTER — Other Ambulatory Visit: Payer: Self-pay

## 2020-06-20 ENCOUNTER — Other Ambulatory Visit: Payer: Self-pay

## 2020-07-04 ENCOUNTER — Other Ambulatory Visit: Payer: Self-pay

## 2020-07-04 ENCOUNTER — Ambulatory Visit: Payer: Self-pay | Attending: Family Medicine

## 2020-07-20 ENCOUNTER — Other Ambulatory Visit: Payer: Self-pay

## 2020-07-20 ENCOUNTER — Ambulatory Visit: Payer: Self-pay | Attending: Nurse Practitioner | Admitting: Nurse Practitioner

## 2020-07-20 ENCOUNTER — Encounter: Payer: Self-pay | Admitting: Nurse Practitioner

## 2020-07-20 VITALS — BP 115/75 | HR 74 | Ht 61.0 in | Wt 197.2 lb

## 2020-07-20 DIAGNOSIS — M2550 Pain in unspecified joint: Secondary | ICD-10-CM

## 2020-07-20 DIAGNOSIS — I10 Essential (primary) hypertension: Secondary | ICD-10-CM

## 2020-07-20 DIAGNOSIS — R7303 Prediabetes: Secondary | ICD-10-CM

## 2020-07-20 DIAGNOSIS — E785 Hyperlipidemia, unspecified: Secondary | ICD-10-CM

## 2020-07-20 MED ORDER — AMLODIPINE BESYLATE 10 MG PO TABS
ORAL_TABLET | Freq: Every day | ORAL | 1 refills | Status: DC
  Filled 2020-07-20: qty 30, 30d supply, fill #0
  Filled 2020-08-27: qty 30, 30d supply, fill #1
  Filled 2020-09-27: qty 30, 30d supply, fill #2

## 2020-07-20 MED ORDER — HYDROCHLOROTHIAZIDE 25 MG PO TABS
ORAL_TABLET | Freq: Every day | ORAL | 1 refills | Status: DC
  Filled 2020-07-20: qty 30, 30d supply, fill #0
  Filled 2020-08-27: qty 30, 30d supply, fill #1
  Filled 2020-09-27: qty 30, 30d supply, fill #2

## 2020-07-20 MED ORDER — GABAPENTIN 100 MG PO CAPS
100.0000 mg | ORAL_CAPSULE | Freq: Every day | ORAL | 3 refills | Status: DC
  Filled 2020-07-20: qty 30, 30d supply, fill #0
  Filled 2020-08-27: qty 30, 30d supply, fill #1
  Filled 2020-09-27: qty 30, 30d supply, fill #2

## 2020-07-20 NOTE — Progress Notes (Signed)
Assessment & Plan:  [facility]ina [facility] was seen today for hypertension.  Diagnoses and all orders for this visit:  Arthralgia of multiple joints -     Lupus anticoagulant panel -     gabapentin (NEURONTIN) 100 MG capsule; Take 1 capsule (100 mg total) by mouth at bedtime. -     Rheumatoid Arthritis Profile  Essential hypertension -     amLODipine (NORVASC) 10 MG tablet; TAKE 1 TABLET (10 MG TOTAL) BY MOUTH DAILY. -     hydrochlorothiazide (HYDRODIURIL) 25 MG tablet; TAKE 1 TABLET (25 MG TOTAL) BY MOUTH DAILY. -     Basic metabolic panel Continue all antihypertensives as prescribed.  Remember to bring in your blood pressure log with you for your follow up appointment.  DASH/Mediterranean Diets are healthier choices for HTN.    Prediabetes -     Hemoglobin A1c   Dyslipidemia, goal LDL below 100 -     Lipid panel   Patient has been counseled on age-appropriate routine health concerns for screening and prevention. These are reviewed and up-to-date. Referrals have been placed accordingly. Immunizations are up-to-date or declined.    Subjective:   Chief Complaint  Patient presents with   Hypertension   HPI Nina Hopkins 60 y.o. female presents to office today for follow up to HTN. She has a past medical history of Anemia, Anxiety (~ 2008), Hyperlipidemia, Hypertension   She has complaints of multiple joint arthralgias. Left ankle and foot swelling and pain along with bilateral hand pain. States her mother and grandmother had serious arthritic conditions and she is concerned her pain could be related to another medical condition aside from OA.  RA panel 2019 was negative.  Depression screen Nina [facility] 2/9 07/20/2020 08/10/2019 05/16/2019 02/15/2019 03/23/2018  Decreased Interest 0 0 0 0 0  Down, Depressed, Hopeless 0 0 0 0 0  PHQ - 2 Score 0 0 0 0 0  Altered sleeping 2 3 0 2 0  Tired, decreased energy 2 2 0 0 0  Change in appetite 0 3 0 0 0  Feeling bad or failure about yourself  0 2 0 0  0  Trouble concentrating 0 2 0 0 0  Moving slowly or fidgety/restless 0 0 0 0 0  Suicidal thoughts 0 0 0 0 0  PHQ-9 Score 4 12 0 2 0  Some recent data might be hidden    GAD 7 : Generalized Anxiety Score 07/20/2020 08/10/2019 05/16/2019 02/15/2019  Nervous, Anxious, on Edge 0 3 1 0  Control/stop worrying 1 3 0 0  Worry too much - different things 1 3 0 0  Trouble relaxing 1 3 0 0  Restless 1 0 0 0  Easily annoyed or irritable 0 0 0 0  Afraid - awful might happen 0 1 0 0  Total GAD 7 Score 4 13 1  0      Dyslipidemia The 10-year ASCVD risk score [facility]) is: 3.8%   Values used to calculate the score:     Age: 42 years     Sex: Female     Is Non-Hispanic African American: Yes     Diabetic: No     Tobacco smoker: No     Systolic Blood Pressure: 308 mmHg     Is BP treated: Yes     HDL Cholesterol: 69 mg/dL     Total Cholesterol: 208 mg/dL   Essential Hypertension Well controlled with amlodipine 10 mg daily and HCTZ 25 mg  daily. Denies chest pain, shortness of breath, palpitations, lightheadedness, dizziness, headaches or BLE edema.   BP Readings from Last 3 Encounters:  07/20/20 115/75  03/22/20 116/78  08/10/19 110/74    Review of Systems  Constitutional:  Negative for fever, malaise/fatigue and weight loss.  HENT: Negative.  Negative for nosebleeds.   Eyes: Negative.  Negative for blurred vision, double vision and photophobia.  Respiratory: Negative.  Negative for cough and shortness of breath.   Cardiovascular: Negative.  Negative for chest pain, palpitations and leg swelling.  Gastrointestinal: Negative.  Negative for heartburn, nausea and vomiting.  Musculoskeletal:  Positive for joint pain. Negative for myalgias.  Neurological: Negative.  Negative for dizziness, focal weakness, seizures and headaches.  Psychiatric/Behavioral: Negative.  Negative for suicidal ideas.    Past Medical History:  Diagnosis Date   Anemia    Anxiety ~ 2008   no meds    Dyspnea    hx - r/t anemia per patient   Family history of adverse reaction to anesthesia    "my sister had a hard time waking up"   History of blood transfusion 12/20/2014   related to anemia   Hyperlipidemia    Hypertension    Missed ab    x 1 - no surgery required   SVD (spontaneous vaginal delivery)    x 2    Past Surgical History:  Procedure Laterality Date   ABDOMINAL HYSTERECTOMY N/A 03/17/2017   Procedure: HYSTERECTOMY ABDOMINAL;  Surgeon: Nina Filbert, MD;  Location: [facility];  Service: Gynecology;  Laterality: N/A;   CERVIX LESION DESTRUCTION     cryro   CYSTOSCOPY N/A 03/17/2017   Procedure: CYSTOSCOPY;  Surgeon: Nina Filbert, MD;  Location: [facility];  Service: Gynecology;  Laterality: N/A;   DILATION AND CURETTAGE, DIAGNOSTIC / THERAPEUTIC      in office   SALPINGOOPHORECTOMY Bilateral 03/17/2017   Procedure: SALPINGO OOPHORECTOMY;  Surgeon: Nina Filbert, MD;  Location: [facility];  Service: Gynecology;  Laterality: Bilateral;   TUBAL LIGATION  78's    Family History  Problem Relation Age of Onset   Cancer Mother        BRAIN TUMOR, VULVAR   Hypertension Mother    Diabetes Mother    Cancer Father        PENILE   Cancer Maternal Grandmother        LUNG   Cancer Maternal Grandfather        COLON   Diabetes Sister    Hypertension Sister    Heart disease Sister     Social History Reviewed with no changes to be made today.   Outpatient Medications Prior to Visit  Medication Sig Dispense Refill   hydrOXYzine (VISTARIL) 50 MG capsule TAKE 1 CAPSULE (50 MG TOTAL) BY MOUTH 3 (Nina) TIMES DAILY AS NEEDED. 60 capsule 0   amLODipine (NORVASC) 10 MG tablet TAKE 1 TABLET (10 MG TOTAL) BY MOUTH DAILY. 90 tablet 1   hydrochlorothiazide (HYDRODIURIL) 25 MG tablet TAKE 1 TABLET (25 MG TOTAL) BY MOUTH DAILY. 90 tablet 1   No facility-administered medications prior to visit.    Allergies  Allergen Reactions   Prilosec [Omeprazole]     Stiff neck       Objective:     BP 115/75   Pulse 74   Ht 5\' 1"  (1.549 m)   Wt 197 lb 3.2 oz (89.4 kg)   LMP 03/02/2017 (Approximate)   SpO2 99%   BMI 37.26 kg/m  Wt Readings  from Last 3 Encounters:  07/20/20 197 lb 3.2 oz (89.4 kg)  03/22/20 206 lb (93.4 kg)  08/10/19 205 lb (93 kg)    Physical Exam Vitals and nursing note reviewed.  Constitutional:      Appearance: She is well-developed.  HENT:     Head: Normocephalic and atraumatic.  Cardiovascular:     Rate and Rhythm: Normal rate and regular rhythm.     Heart sounds: Normal heart sounds. No murmur heard.   No friction rub. No gallop.  Pulmonary:     Effort: Pulmonary effort is normal. No tachypnea or respiratory distress.     Breath sounds: Normal breath sounds. No decreased breath sounds, wheezing, rhonchi or rales.  Chest:     Chest wall: No tenderness.  Abdominal:     General: Bowel sounds are normal.     Palpations: Abdomen is soft.  Musculoskeletal:        General: Normal range of motion.     Cervical back: Normal range of motion.  Skin:    General: Skin is warm and dry.  Neurological:     Mental Status: She is alert and oriented to person, place, and time.     Coordination: Coordination normal.  Psychiatric:        Behavior: Behavior normal. Behavior is cooperative.        Thought Content: Thought content normal.        Judgment: Judgment normal.         Patient has been counseled extensively about nutrition and exercise as well as the importance of adherence with medications and regular follow-up. The patient was given clear instructions to go to ER or return to medical center if symptoms don't improve, worsen or new problems develop. The patient verbalized understanding.   Follow-up: Return in about 3 months (around 10/20/2020).   Gildardo Pounds, FNP-BC [facility] and [place], [place]   07/22/2020, 6:55 PM

## 2020-07-22 ENCOUNTER — Encounter: Payer: Self-pay | Admitting: Nurse Practitioner

## 2020-07-23 ENCOUNTER — Other Ambulatory Visit: Payer: Self-pay

## 2020-07-24 ENCOUNTER — Other Ambulatory Visit: Payer: Self-pay

## 2020-07-24 LAB — BASIC METABOLIC PANEL
BUN/Creatinine Ratio: 12 (ref 9–23)
BUN: 9 mg/dL (ref 6–24)
CO2: 26 mmol/L (ref 20–29)
Calcium: 9.8 mg/dL (ref 8.7–10.2)
Chloride: 103 mmol/L (ref 96–106)
Creatinine, Ser: 0.74 mg/dL (ref 0.57–1.00)
Glucose: 113 mg/dL — ABNORMAL HIGH (ref 65–99)
Potassium: 3.7 mmol/L (ref 3.5–5.2)
Sodium: 143 mmol/L (ref 134–144)
eGFR: 94 mL/min/{1.73_m2} (ref >59)

## 2020-07-24 LAB — RHEUMATOID ARTHRITIS PROFILE
Cyclic Citrullin Peptide Ab: 7 units (ref 0–19)
Rheumatoid fact SerPl-aCnc: <10 IU/mL (ref <14.0)

## 2020-07-24 LAB — LIPID PANEL
Chol/HDL Ratio: 3 ratio (ref 0.0–4.4)
Cholesterol, Total: 208 mg/dL — ABNORMAL HIGH (ref 100–199)
HDL: 69 mg/dL (ref >39)
LDL Chol Calc (NIH): 124 mg/dL — ABNORMAL HIGH (ref 0–99)
Triglycerides: 86 mg/dL (ref 0–149)
VLDL Cholesterol Cal: 15 mg/dL (ref 5–40)

## 2020-07-24 LAB — LUPUS ANTICOAGULANT PANEL
Dilute Viper Venom Time: 35 s (ref 0.0–47.0)
PTT Lupus Anticoagulant: 29.3 s (ref 0.0–51.9)

## 2020-07-24 LAB — HEMOGLOBIN A1C
Est. average glucose Bld gHb Est-mCnc: 117 mg/dL
Hgb A1c MFr Bld: 5.7 % — ABNORMAL HIGH (ref 4.8–5.6)

## 2020-08-27 ENCOUNTER — Other Ambulatory Visit: Payer: Self-pay

## 2020-08-28 ENCOUNTER — Other Ambulatory Visit: Payer: Self-pay

## 2020-08-29 ENCOUNTER — Other Ambulatory Visit: Payer: Self-pay

## 2020-09-27 ENCOUNTER — Other Ambulatory Visit: Payer: Self-pay

## 2020-09-28 ENCOUNTER — Telehealth: Payer: Self-pay | Admitting: Nurse Practitioner

## 2020-09-28 ENCOUNTER — Ambulatory Visit: Payer: Self-pay | Admitting: *Deleted

## 2020-09-28 NOTE — Telephone Encounter (Signed)
Pt called in and left a message that she did not want an appt.   She just wanted to let her dr. [name], NP know that she had Covid and felt bad but didn't want an appt for now. I passed this information along to [place]. Reason for Disposition . [1] OIZTI-45 diagnosed by positive lab test (e.g., PCR, rapid self-test kit) AND [2] mild symptoms (e.g., cough, fever, others) AND [8] no complications or SOB  Answer Assessment - Initial Assessment Questions 1. COVID-19 DIAGNOSIS: "Who made your COVID-19 diagnosis?" "Was it confirmed by a positive lab test or self-test?" If not diagnosed by a doctor (or NP/PA), ask "Are there lots of cases (community spread) where you live?" Note: See public health department website, if unsure.     I returned her call. 2. COVID-19 EXPOSURE: "Was there any known exposure to COVID before the symptoms began?" CDC Definition of close contact: within 6 feet (2 meters) for a total of 15 minutes or more over a 24-hour period.      *No Answer* 3. ONSET: "When did the COVID-19 symptoms start?"      Tuesday symptoms started. 4. WORST SYMPTOM: "What is your worst symptom?" (e.g., cough, fever, shortness of breath, muscle aches)     Runny nose, headache, body aches, coughing, urinating.    I taking a [facility] brand of cold medicine.   Advil Gel caps. 5. COUGH: "Do you have a cough?" If Yes, ask: "How bad is the cough?"       Yes  coughing up clear mucus 6. FEVER: "Do you have a fever?" If Yes, ask: "What is your temperature, how was it measured, and when did it start?"     98.6 normal for me  that's my temperature a few minutes ago 7. RESPIRATORY STATUS: "Describe your breathing?" (e.g., shortness of breath, wheezing, unable to speak)      No shortness of breath   8. BETTER-SAME-WORSE: "Are you getting better, staying the same or getting worse compared to yesterday?"  If getting worse, ask, "In what way?"     Not asked 9. HIGH RISK DISEASE: "Do you  have any chronic medical problems?" (e.g., asthma, heart or lung disease, weak immune system, obesity, etc.)     hypertension 10. VACCINE: "Have you had the COVID-19 vaccine?" If Yes, ask: "Which one, how many shots, when did you get it?"       Not asked 11. BOOSTER: "Have you received your COVID-19 booster?" If Yes, ask: "Which one and when did you get it?"       Not asked 12. PREGNANCY: "Is there any chance you are pregnant?" "When was your last menstrual period?"       Not asked 13. OTHER SYMPTOMS: "Do you have any other symptoms?"  (e.g., chills, fatigue, headache, loss of smell or taste, muscle pain, sore throat)       Headache, runny nose, body aches, chills, coughing and just feel terrible. 14. O2 SATURATION MONITOR:  "Do you use an oxygen saturation monitor (pulse oximeter) at home?" If Yes, ask "What is your reading (oxygen level) today?" "What is your usual oxygen saturation reading?" (e.g., 95%)       No  Protocols used: Coronavirus (COVID-19) Diagnosed or Suspected-A-AH

## 2020-09-28 NOTE — Telephone Encounter (Signed)
Copied from [place] [id number]. Topic: General - Other >> Sep 28, 2020 12:14 PM Alanda Slim E wrote: Reason for CRM: pt called to report she had tested positive for covid at home/ she stated she doesn't feel bad enough to be seen but just wanted provider to know

## 2020-09-28 NOTE — Telephone Encounter (Signed)
NOTED

## 2020-09-28 NOTE — Telephone Encounter (Signed)
FYI

## 2020-10-26 ENCOUNTER — Ambulatory Visit: Payer: Self-pay | Attending: Nurse Practitioner | Admitting: Nurse Practitioner

## 2020-10-26 ENCOUNTER — Other Ambulatory Visit: Payer: Self-pay

## 2020-10-26 VITALS — BP 110/77 | HR 77 | Ht 61.0 in | Wt 200.1 lb

## 2020-10-26 DIAGNOSIS — F32A Depression, unspecified: Secondary | ICD-10-CM

## 2020-10-26 DIAGNOSIS — Z1231 Encounter for screening mammogram for malignant neoplasm of breast: Secondary | ICD-10-CM

## 2020-10-26 DIAGNOSIS — G8929 Other chronic pain: Secondary | ICD-10-CM

## 2020-10-26 DIAGNOSIS — I10 Essential (primary) hypertension: Secondary | ICD-10-CM

## 2020-10-26 DIAGNOSIS — Z23 Encounter for immunization: Secondary | ICD-10-CM

## 2020-10-26 DIAGNOSIS — F419 Anxiety disorder, unspecified: Secondary | ICD-10-CM

## 2020-10-26 DIAGNOSIS — M25512 Pain in left shoulder: Secondary | ICD-10-CM

## 2020-10-26 MED ORDER — MELOXICAM 7.5 MG PO TABS
7.5000 mg | ORAL_TABLET | Freq: Every day | ORAL | 1 refills | Status: DC
  Filled 2020-10-26: qty 30, 30d supply, fill #0

## 2020-10-26 MED ORDER — HYDROCHLOROTHIAZIDE 25 MG PO TABS
ORAL_TABLET | Freq: Every day | ORAL | 1 refills | Status: DC
  Filled 2020-10-26: qty 30, 30d supply, fill #0
  Filled 2020-12-05: qty 30, 30d supply, fill #1
  Filled 2021-01-22: qty 30, 30d supply, fill #2

## 2020-10-26 MED ORDER — AMLODIPINE BESYLATE 10 MG PO TABS
ORAL_TABLET | Freq: Every day | ORAL | 1 refills | Status: DC
  Filled 2020-10-26: qty 30, 30d supply, fill #0
  Filled 2020-12-05: qty 30, 30d supply, fill #1
  Filled 2021-01-22: qty 90, 90d supply, fill #2

## 2020-10-26 NOTE — Progress Notes (Signed)
Assessment & Plan:  Nina [facility] was seen today for hypertension.  Diagnoses and all orders for this visit:  Primary hypertension -     CMP14+EGFR -     amLODipine (NORVASC) 10 MG tablet; TAKE 1 TABLET (10 MG TOTAL) BY MOUTH DAILY. -     hydrochlorothiazide (HYDRODIURIL) 25 MG tablet; TAKE 1 TABLET (25 MG TOTAL) BY MOUTH DAILY. Continue all antihypertensives as prescribed.  Remember to bring in your blood pressure log with you for your follow up appointment.  DASH/Mediterranean Diets are healthier choices for HTN.    Anxiety and depression -     Ambulatory referral to [facility]  Chronic left shoulder pain -     DG Shoulder Left; Future -     meloxicam (MOBIC) 7.5 MG tablet; Take 1 tablet (7.5 mg total) by mouth daily.  Breast cancer screening by mammogram -     MM 3D SCREEN BREAST BILATERAL; Future  Need for immunization against influenza -     Flu Vaccine QUAD 27moIM (Fluarix, Fluzone & Alfiuria Quad PF)   Patient has been counseled on age-appropriate routine health concerns for screening and prevention. These are reviewed and up-to-date. Referrals have been placed accordingly. Immunizations are up-to-date or declined.    Subjective:   Chief Complaint  Patient presents with   Hypertension   HPI Nina Hopkins 60y.o. female presents to office today for follow up to HTN.  She has a past medical history of Anemia, Anxiety (~ 2008), Dyspnea, Hyperlipidemia, Hypertension   Arthralgias Endorses Left shoulder pain, Left hip, left thigh and left knee pain RA panel negative. She denies any injury or trauma.    Anxiety and Depression Declines SSRI today. Taking hydroxyzine as needed. Requesting referral to behavioral health for counseling. Her mother passed last year. She denies any thoughts or plan of self harm  HTN Well controlled with amlodipine 10 mg daily, HCTZ 25 mg daily. Denies chest pain, shortness of breath, palpitations, lightheadedness,  dizziness, headaches or BLE edema.   BP Readings from Last 3 Encounters:  10/26/20 110/77  07/20/20 115/75  03/22/20 116/78     Review of Systems  Constitutional:  Negative for fever, malaise/fatigue and weight loss.  HENT: Negative.  Negative for nosebleeds.   Eyes: Negative.  Negative for blurred vision, double vision and photophobia.  Respiratory: Negative.  Negative for cough and shortness of breath.   Cardiovascular: Negative.  Negative for chest pain, palpitations and leg swelling.  Gastrointestinal: Negative.  Negative for heartburn, nausea and vomiting.  Musculoskeletal:  Positive for joint pain. Negative for myalgias.  Neurological: Negative.  Negative for dizziness, focal weakness, seizures and headaches.  Psychiatric/Behavioral:  Positive for depression. Negative for suicidal ideas. The patient is nervous/anxious.    Past Medical History:  Diagnosis Date   Anemia    Anxiety ~ 2008   no meds   Dyspnea    hx - r/t anemia per patient   Family history of adverse reaction to anesthesia    "my sister had a hard time waking up"   History of blood transfusion 12/20/2014   related to anemia   Hyperlipidemia    Hypertension    Missed ab    x 1 - no surgery required   SVD (spontaneous vaginal delivery)    x 2    Past Surgical History:  Procedure Laterality Date   ABDOMINAL HYSTERECTOMY N/A 03/17/2017   Procedure: HYSTERECTOMY ABDOMINAL;  Surgeon: DEmily Filbert MD;  [facility];  Service: Gynecology;  Laterality: N/A;   CERVIX LESION DESTRUCTION     cryro   CYSTOSCOPY N/A 03/17/2017   Procedure: CYSTOSCOPY;  Surgeon: Emily Filbert, MD;  Location: [facility];  Service: Gynecology;  Laterality: N/A;   DILATION AND CURETTAGE, DIAGNOSTIC / THERAPEUTIC      in office   SALPINGOOPHORECTOMY Bilateral 03/17/2017   Procedure: SALPINGO OOPHORECTOMY;  Surgeon: Emily Filbert, MD;  Location: [facility];  Service: Gynecology;  Laterality: Bilateral;   TUBAL LIGATION  51's    Family  History  Problem Relation Age of Onset   Cancer Mother        BRAIN TUMOR, VULVAR   Hypertension Mother    Diabetes Mother    Cancer Father        PENILE   Cancer Maternal Grandmother        LUNG   Cancer Maternal Grandfather        COLON   Diabetes Sister    Hypertension Sister    Heart disease Sister     Social History Reviewed with no changes to be made today.   Outpatient Medications Prior to Visit  Medication Sig Dispense Refill   gabapentin (NEURONTIN) 100 MG capsule Take 1 capsule (100 mg total) by mouth at bedtime. 30 capsule 3   hydrOXYzine (VISTARIL) 50 MG capsule TAKE 1 CAPSULE (50 MG TOTAL) BY MOUTH 3 (THREE) TIMES DAILY AS NEEDED. 60 capsule 0   amLODipine (NORVASC) 10 MG tablet TAKE 1 TABLET (10 MG TOTAL) BY MOUTH DAILY. 90 tablet 1   hydrochlorothiazide (HYDRODIURIL) 25 MG tablet TAKE 1 TABLET (25 MG TOTAL) BY MOUTH DAILY. 90 tablet 1   No facility-administered medications prior to visit.    Allergies  Allergen Reactions   Prilosec [Omeprazole]     Stiff neck       Objective:    BP 110/77   Pulse 77   Ht 5' 1"  (1.549 m)   Wt 200 lb 2 oz (90.8 kg)   LMP 03/02/2017 (Approximate)   SpO2 97%   BMI 37.81 kg/m  Wt Readings from Last 3 Encounters:  10/26/20 200 lb 2 oz (90.8 kg)  07/20/20 197 lb 3.2 oz (89.4 kg)  03/22/20 206 lb (93.4 kg)    Physical Exam Vitals and nursing note reviewed.  Constitutional:      Appearance: She is well-developed.  HENT:     Head: Normocephalic and atraumatic.  Cardiovascular:     Rate and Rhythm: Normal rate and regular rhythm.     Heart sounds: Normal heart sounds. No murmur heard.   No friction rub. No gallop.  Pulmonary:     Effort: Pulmonary effort is normal. No tachypnea or respiratory distress.     Breath sounds: Normal breath sounds. No decreased breath sounds, wheezing, rhonchi or rales.  Chest:     Chest wall: No tenderness.  Abdominal:     General: Bowel sounds are normal.     Palpations: Abdomen is  soft.  Musculoskeletal:     Left shoulder: Decreased range of motion.     Cervical back: Normal range of motion.  Skin:    General: Skin is warm and dry.  Neurological:     Mental Status: She is alert and oriented to person, place, and time.     Coordination: Coordination normal.  Psychiatric:        Behavior: Behavior normal. Behavior is cooperative.        Thought Content: Thought content normal.  Judgment: Judgment normal.         Patient has been counseled extensively about nutrition and exercise as well as the importance of adherence with medications and regular follow-up. The patient was given clear instructions to go to ER or return to medical center if symptoms don't improve, worsen or new problems develop. The patient verbalized understanding.   Follow-up: Return in about 3 months (around 01/25/2021).   Gildardo Pounds, FNP-BC [facility] and [facility] [place], [place]   10/26/2020, 8:47 PM

## 2020-10-27 ENCOUNTER — Encounter: Payer: Self-pay | Admitting: Nurse Practitioner

## 2020-10-27 LAB — CMP14+EGFR
ALT: 19 IU/L (ref 0–32)
AST: 22 IU/L (ref 0–40)
Albumin/Globulin Ratio: 1.6 (ref 1.2–2.2)
Albumin: 4.6 g/dL (ref 3.8–4.9)
Alkaline Phosphatase: 82 IU/L (ref 44–121)
BUN/Creatinine Ratio: 19 (ref 9–23)
BUN: 13 mg/dL (ref 6–24)
Bilirubin Total: 0.2 mg/dL (ref 0.0–1.2)
CO2: 25 mmol/L (ref 20–29)
Calcium: 9.6 mg/dL (ref 8.7–10.2)
Chloride: 101 mmol/L (ref 96–106)
Creatinine, Ser: 0.67 mg/dL (ref 0.57–1.00)
Globulin, Total: 2.8 g/dL (ref 1.5–4.5)
Glucose: 95 mg/dL (ref 65–99)
Potassium: 3.9 mmol/L (ref 3.5–5.2)
Sodium: 142 mmol/L (ref 134–144)
Total Protein: 7.4 g/dL (ref 6.0–8.5)
eGFR: 101 mL/min/{1.73_m2} (ref >59)

## 2020-11-01 ENCOUNTER — Other Ambulatory Visit: Payer: Self-pay

## 2020-11-01 ENCOUNTER — Ambulatory Visit (HOSPITAL_COMMUNITY)
Admission: RE | Admit: 2020-11-01 | Discharge: 2020-11-01 | Disposition: A | Discharge status: Home / Self Care | Payer: Self-pay | Source: Ambulatory Visit | Attending: Nurse Practitioner | Admitting: Nurse Practitioner

## 2020-11-01 DIAGNOSIS — M25512 Pain in left shoulder: Secondary | ICD-10-CM | POA: Insufficient documentation

## 2020-11-01 DIAGNOSIS — G8929 Other chronic pain: Secondary | ICD-10-CM | POA: Insufficient documentation

## 2020-11-05 ENCOUNTER — Encounter: Payer: Self-pay | Admitting: Nurse Practitioner

## 2020-11-05 ENCOUNTER — Other Ambulatory Visit: Payer: Self-pay | Admitting: Nurse Practitioner

## 2020-11-05 DIAGNOSIS — R0789 Other chest pain: Secondary | ICD-10-CM

## 2020-12-05 ENCOUNTER — Other Ambulatory Visit: Payer: Self-pay

## 2020-12-17 NOTE — Progress Notes (Signed)
Cardiology Office Note:    Date:  12/19/2020   ID:  Nina Hopkins, DOB 1960/12/07, MRN [id number]  PCP:  Nina Pounds, NP  Cardiologist:  Nina Osmond, MD  Electrophysiologist:  None   Referring MD: Nina Pounds, NP   Chief Complaint/Reason for Referral: Chest pain  History of Present Illness:    Nina Hopkins is a 60 y.o. female with the indicated history seen in September by her primary care provider's office.  At that point in time patient was complaining of the left shoulder pain.  She had a left shoulder x-ray that I reviewed with no obvious abnormalities.  She tells me she can get the pain when she walks or when she is sitting around.  There is radiation to her left chest.  She also is dyspneic at times.  Sometimes this occurs with exertion and other times it occurs at rest.  She denies any palpitations, paroxysmal nocturnal dyspnea, orthopnea, or severe bleeding.  She has some mild ankle swelling that occurs as well.  She has required no emergency room visits or hospitalizations recently.    Past Medical History:  Diagnosis Date   Anemia    Anxiety ~ 2008   no meds   Dyspnea    hx - r/t anemia per patient   Family history of adverse reaction to anesthesia    "my sister had a hard time waking up"   History of blood transfusion 12/20/2014   related to anemia   Hyperlipidemia    Hypertension    Missed ab    x 1 - no surgery required   SVD (spontaneous vaginal delivery)    x 2    Past Surgical History:  Procedure Laterality Date   ABDOMINAL HYSTERECTOMY N/A 03/17/2017   Procedure: HYSTERECTOMY ABDOMINAL;  Surgeon: Emily Filbert, MD;  Location: [facility];  Service: Gynecology;  Laterality: N/A;   CERVIX LESION DESTRUCTION     cryro   CYSTOSCOPY N/A 03/17/2017   Procedure: CYSTOSCOPY;  Surgeon: Emily Filbert, MD;  Location: [facility];  Service: Gynecology;  Laterality: N/A;   DILATION AND CURETTAGE, DIAGNOSTIC / THERAPEUTIC      in office    SALPINGOOPHORECTOMY Bilateral 03/17/2017   Procedure: SALPINGO OOPHORECTOMY;  Surgeon: Emily Filbert, MD;  Location: [facility];  Service: Gynecology;  Laterality: Bilateral;   TUBAL LIGATION  1990's    Current Medications: Current Meds  Medication Sig   amLODipine (NORVASC) 10 MG tablet TAKE 1 TABLET (10 MG TOTAL) BY MOUTH DAILY.   docusate sodium (STOOL SOFTENER) 100 MG capsule Take 100 mg by mouth every other day.   gabapentin (NEURONTIN) 100 MG capsule Take 1 capsule (100 mg total) by mouth at bedtime.   hydrochlorothiazide (HYDRODIURIL) 25 MG tablet TAKE 1 TABLET (25 MG TOTAL) BY MOUTH DAILY.   hydrOXYzine (VISTARIL) 50 MG capsule TAKE 1 CAPSULE (50 MG TOTAL) BY MOUTH 3 (THREE) TIMES DAILY AS NEEDED.   meloxicam (MOBIC) 7.5 MG tablet Take 1 tablet (7.5 mg total) by mouth daily.   metoprolol tartrate (LOPRESSOR) 100 MG tablet Take 1 tablet by mouth 2 hours prior to your cardiac ct   Multiple Vitamin (MULTIVITAMIN WITH MINERALS) TABS tablet Take 1 tablet by mouth daily.   Omega-3 Fatty Acids (FISH OIL) 1000 MG CAPS Take 1 capsule by mouth daily.     Allergies:    Allergies  Allergen Reactions   Prilosec [Omeprazole]     Stiff neck    Social History  Tobacco Use   Smoking status: Never   Smokeless tobacco: Never  Vaping Use   Vaping Use: Never used  Substance Use Topics   Alcohol use: Yes    Comment: occasionally    Drug use: No     Family History: Family History  Problem Relation Age of Onset   Cancer Mother        BRAIN TUMOR, VULVAR   Hypertension Mother    Diabetes Mother    Cancer Father        PENILE   Cancer Maternal Grandmother        LUNG   Cancer Maternal Grandfather        COLON   Diabetes Sister    Hypertension Sister    Heart disease Sister      ROS:   Please see the history of present illness.    All other systems reviewed and are negative.  EKGs/Labs/Other Studies Reviewed:    The following studies were reviewed today:  EKG: EKG  demonstrates sinus rhythm with left anterior fascicular block nonspecific ST and T wave changes.  Imaging studies that I have independently reviewed today:   Nuclear stress 2019 Clinically and electrically negative for ischemia Inferoseptal defect and basal inferior defect consistent with scar and/or soft tissue attenuation No ischemia LVEF calculated at 48% Intermediate risk study due to depressed LVEF  TTE 2019  - Left ventricle: LV global longitudinal strain is -17% The cavity    size was normal. Wall thickness was increased in a pattern of    mild LVH. Systolic function was normal. The estimated ejection    fraction was in the range of 60% to 65%. The study is not    technically sufficient to allow evaluation of LV diastolic    function.  - Left atrium: The atrium was mildly dilated.  - Tricuspid valve: There was mild-moderate regurgitation.  - Pericardium, extracardiac: A trivial pericardial effusion was    identified.   Recent Labs: 03/12/2020: Hemoglobin 12.4; Platelets 243 10/26/2020: ALT 19; BUN 13; Creatinine, Ser 0.67; Potassium 3.9; Sodium 142   Recent Lipid Panel    Component Value Date/Time   CHOL 208 (H) 07/20/2020 1648   TRIG 86 07/20/2020 1648   HDL 69 07/20/2020 1648   CHOLHDL 3.0 07/20/2020 1648   CHOLHDL 3.0 07/10/2017 1026   VLDL 17 07/10/2017 1026   LDLCALC 124 (H) 07/20/2020 1648    Physical Exam:    VS:  BP 110/80   Pulse 84   Ht 5\' 1"  (1.549 m)   Wt 201 lb (91.2 kg)   LMP 03/02/2017 (Approximate)   SpO2 96%   BMI 37.98 kg/m     Wt Readings from Last 5 Encounters:  12/19/20 201 lb (91.2 kg)  10/26/20 200 lb 2 oz (90.8 kg)  07/20/20 197 lb 3.2 oz (89.4 kg)  03/22/20 206 lb (93.4 kg)  08/10/19 205 lb (93 kg)    GENERAL:  No apparent distress, AOx3 HEENT:  No carotid bruits, +2 carotid impulses, no scleral icterus CAR: RRR no murmurs, gallops, rubs, or thrills RES:  Clear to auscultation bilaterally ABD:  Soft, nontender, nondistended,  positive bowel sounds x 4 VASC:  +2 radial pulses, +2 carotid pulses, palpable pedal pulses NEURO:  CN 2-12 grossly intact; motor and sensory grossly intact PSYCH:  No active depression or anxiety EXT:  No edema, ecchymosis, or cyanosis  ASSESSMENT:    1. Chest pain, unspecified type   2. Hypertension, unspecified type   3.  Shortness of breath   4. Precordial pain    PLAN:    Chest pain, unspecified type - Plan: EKG 12-Lead There are some typical and atypical features to the patient's chest pain complaint.  I will obtain an coronary CTA to evaluate further.  I will keep follow-up with me open-ended.  Hypertension, unspecified type - Plan: EKG 12-Lead Blood pressures well controlled on her current regimen.  Shortness of breath Will obtain echocardiogram to evaluate further.   Shared Decision Making/Informed Consent:       Medication Adjustments/Labs and Tests Ordered: Current medicines are reviewed at length with the patient today.  Concerns regarding medicines are outlined above.   Orders Placed This Encounter  Procedures   CT CORONARY MORPH W/CTA COR W/SCORE W/CA W/CM &/OR WO/CM   Basic metabolic panel   EKG 41-LKGM   ECHOCARDIOGRAM COMPLETE     Meds ordered this encounter  Medications   metoprolol tartrate (LOPRESSOR) 100 MG tablet    Sig: Take 1 tablet by mouth 2 hours prior to your cardiac ct    Dispense:  1 tablet    Refill:  0     Patient Instructions  Medication Instructions:  Your physician recommends that you continue on your current medications as directed. Please refer to the Current Medication list given to you today.  *If you need a refill on your cardiac medications before your next appointment, please call your pharmacy*   Lab Work: TO BE DONE BEFORE YOUR CARDIAC CT, SOMEONE WILL LET YOU KNOW WHEN TO COME BACK FOR THIS:  BMET  If you have labs (blood work) drawn today and your tests are completely normal, you will receive your results only  by: [place] (if you have MyChart) OR A paper copy in the mail If you have any lab test that is abnormal or we need to change your treatment, we will call you to review the results.   Testing/Procedures: Your physician has requested that you have an echocardiogram. Echocardiography is a painless test that uses sound waves to create images of your heart. It provides your doctor with information about the size and shape of your heart and how well your heart's chambers and valves are working. This procedure takes approximately one hour. There are no restrictions for this procedure.   Your physician has requested that you have cardiac CT. Cardiac computed tomography (CT) is a painless test that uses an x-ray machine to take clear, detailed pictures of your heart. For further information please visit [email or web address]. Please follow instruction sheet as BELOW:    Your cardiac CT will be scheduled at one of the below locations:   [facility] [address] [place], [place] [address] [phone]  please arrive at the [facility] main entrance (entrance A) of [facility] 30 minutes prior to test start time. You can use the FREE valet parking offered at the main entrance (encouraged to control the heart rate for the test) Proceed to the [facility] Radiology Department (first floor) to check-in and test prep.   Please follow these instructions carefully (unless otherwise directed):   On the Night Before the Test: Be sure to Drink plenty of water. Do not consume any caffeinated/decaffeinated beverages or chocolate 12 hours prior to your test. Do not take any antihistamines 12 hours prior to your test.  On the Day of the Test: Drink plenty of water until 1 hour prior to the test. Do not eat any food 4 hours prior to  the test. You may take your regular medications prior to the test.  Take metoprolol (Lopressor) two hours prior to test. HOLD  Hydrochlorothiazide morning of the test. FEMALES- please wear underwire-free bra if available, avoid dresses & tight clothing   After the Test: Drink plenty of water. After receiving IV contrast, you may experience a mild flushed feeling. This is normal. On occasion, you may experience a mild rash up to 24 hours after the test. This is not dangerous. If this occurs, you can take Benadryl 25 mg and increase your fluid intake. If you experience trouble breathing, this can be serious. If it is severe call 911 IMMEDIATELY. If it is mild, please call our office. If you take any of these medications: Glipizide/Metformin, Avandament, Glucavance, please do not take 48 hours after completing test unless otherwise instructed.  Please allow 2-4 weeks for scheduling of routine cardiac CTs. Some insurance companies require a pre-authorization which may delay scheduling of this test.   For non-scheduling related questions, please contact the cardiac imaging nurse navigator should you have any questions/concerns: Marchia Bond, Cardiac Imaging Nurse Navigator Gordy Clement, Cardiac Imaging Nurse Navigator [facility] Direct Office Dial: [phone]   For scheduling needs, including cancellations and rescheduling, please call [place], [phone].      Follow-Up: At [facility], you and your health needs are our priority.  As part of our continuing mission to provide you with exceptional heart care, we have created designated Provider Care Teams.  These Care Teams include your primary Cardiologist (physician) and Advanced Practice Providers (APPs -  Physician Assistants and Nurse Practitioners) who all work together to provide you with the care you need, when you need it.  We recommend signing up for the patient portal called "MyChart".  Sign up information is provided on this After Visit Summary.  MyChart is used to connect with patients for Virtual Visits  (Telemedicine).  Patients are able to view lab/test results, encounter notes, upcoming appointments, etc.  Non-urgent messages can be sent to your provider as well.   To learn more about what you can do with MyChart, go to [email or web address].    Your next appointment:   AS NEEDED  The format for your next appointment:   In Person  Provider:   Early Osmond, MD     Other Instructions

## 2020-12-19 ENCOUNTER — Other Ambulatory Visit: Payer: Self-pay

## 2020-12-19 ENCOUNTER — Ambulatory Visit (INDEPENDENT_AMBULATORY_CARE_PROVIDER_SITE_OTHER): Payer: Self-pay | Admitting: Internal Medicine

## 2020-12-19 ENCOUNTER — Encounter: Payer: Self-pay | Admitting: Internal Medicine

## 2020-12-19 VITALS — BP 110/80 | HR 84 | Ht 61.0 in | Wt 201.0 lb

## 2020-12-19 DIAGNOSIS — R0602 Shortness of breath: Secondary | ICD-10-CM

## 2020-12-19 DIAGNOSIS — R072 Precordial pain: Secondary | ICD-10-CM

## 2020-12-19 DIAGNOSIS — R079 Chest pain, unspecified: Secondary | ICD-10-CM

## 2020-12-19 DIAGNOSIS — I10 Essential (primary) hypertension: Secondary | ICD-10-CM

## 2020-12-19 MED ORDER — METOPROLOL TARTRATE 100 MG PO TABS
ORAL_TABLET | ORAL | 0 refills | Status: DC
  Filled 2020-12-19 – 2020-12-31 (×2): qty 1, 1d supply, fill #0

## 2020-12-19 NOTE — Patient Instructions (Addendum)
Medication Instructions:  Your physician recommends that you continue on your current medications as directed. Please refer to the Current Medication list given to you today.  *If you need a refill on your cardiac medications before your next appointment, please call your pharmacy*   Lab Work: TO BE DONE BEFORE YOUR CARDIAC CT, SOMEONE WILL LET YOU KNOW WHEN TO COME BACK FOR THIS:  BMET  If you have labs (blood work) drawn today and your tests are completely normal, you will receive your results only by: [place] (if you have MyChart) OR A paper copy in the mail If you have any lab test that is abnormal or we need to change your treatment, we will call you to review the results.   Testing/Procedures: Your physician has requested that you have an echocardiogram. Echocardiography is a painless test that uses sound waves to create images of your heart. It provides your doctor with information about the size and shape of your heart and how well your heart's chambers and valves are working. This procedure takes approximately one hour. There are no restrictions for this procedure.   Your physician has requested that you have cardiac CT. Cardiac computed tomography (CT) is a painless test that uses an x-ray machine to take clear, detailed pictures of your heart. For further information please visit [email or web address]. Please follow instruction sheet as BELOW:    Your cardiac CT will be scheduled at one of the below locations:   [facility] [address] [place], [place] [address] [phone]  please arrive at the [facility] main entrance (entrance A) of [facility] 30 minutes prior to test start time. You can use the FREE valet parking offered at the main entrance (encouraged to control the heart rate for the test) Proceed to the [facility] Radiology Department (first floor) to check-in and test prep.   Please follow these instructions carefully (unless  otherwise directed):   On the Night Before the Test: Be sure to Drink plenty of water. Do not consume any caffeinated/decaffeinated beverages or chocolate 12 hours prior to your test. Do not take any antihistamines 12 hours prior to your test.  On the Day of the Test: Drink plenty of water until 1 hour prior to the test. Do not eat any food 4 hours prior to the test. You may take your regular medications prior to the test.  Take metoprolol (Lopressor) two hours prior to test. HOLD Hydrochlorothiazide morning of the test. FEMALES- please wear underwire-free bra if available, avoid dresses & tight clothing   After the Test: Drink plenty of water. After receiving IV contrast, you may experience a mild flushed feeling. This is normal. On occasion, you may experience a mild rash up to 24 hours after the test. This is not dangerous. If this occurs, you can take Benadryl 25 mg and increase your fluid intake. If you experience trouble breathing, this can be serious. If it is severe call 911 IMMEDIATELY. If it is mild, please call our office. If you take any of these medications: Glipizide/Metformin, Avandament, Glucavance, please do not take 48 hours after completing test unless otherwise instructed.  Please allow 2-4 weeks for scheduling of routine cardiac CTs. Some insurance companies require a pre-authorization which may delay scheduling of this test.   For non-scheduling related questions, please contact the cardiac imaging nurse navigator should you have any questions/concerns: Marchia Bond, Cardiac Imaging Nurse Navigator Gordy Clement, Cardiac Imaging Nurse Navigator [facility]  Direct Office Dial: [phone]   For scheduling needs, including cancellations and rescheduling, please call [place], [phone].      Follow-Up: At [facility], you and your health needs are our priority.  As part of our continuing mission to provide you with  exceptional heart care, we have created designated Provider Care Teams.  These Care Teams include your primary Cardiologist (physician) and Advanced Practice Providers (APPs -  Physician Assistants and Nurse Practitioners) who all work together to provide you with the care you need, when you need it.  We recommend signing up for the patient portal called "MyChart".  Sign up information is provided on this After Visit Summary.  MyChart is used to connect with patients for Virtual Visits (Telemedicine).  Patients are able to view lab/test results, encounter notes, upcoming appointments, etc.  Non-urgent messages can be sent to your provider as well.   To learn more about what you can do with MyChart, go to [email or web address].    Your next appointment:   AS NEEDED  The format for your next appointment:   In Person  Provider:   Early Osmond, MD     Other Instructions

## 2020-12-26 ENCOUNTER — Other Ambulatory Visit: Payer: Self-pay

## 2020-12-26 ENCOUNTER — Other Ambulatory Visit: Payer: Self-pay | Admitting: *Deleted

## 2020-12-26 DIAGNOSIS — R072 Precordial pain: Secondary | ICD-10-CM

## 2020-12-26 DIAGNOSIS — R079 Chest pain, unspecified: Secondary | ICD-10-CM

## 2020-12-26 DIAGNOSIS — R0602 Shortness of breath: Secondary | ICD-10-CM

## 2020-12-26 DIAGNOSIS — I10 Essential (primary) hypertension: Secondary | ICD-10-CM

## 2020-12-26 LAB — BASIC METABOLIC PANEL
BUN/Creatinine Ratio: 14 (ref 9–23)
BUN: 10 mg/dL (ref 6–24)
CO2: 27 mmol/L (ref 20–29)
Calcium: 9.3 mg/dL (ref 8.7–10.2)
Chloride: 101 mmol/L (ref 96–106)
Creatinine, Ser: 0.74 mg/dL (ref 0.57–1.00)
Glucose: 77 mg/dL (ref 70–99)
Potassium: 3.9 mmol/L (ref 3.5–5.2)
Sodium: 140 mmol/L (ref 134–144)
eGFR: 94 mL/min/{1.73_m2} (ref >59)

## 2020-12-31 ENCOUNTER — Other Ambulatory Visit: Payer: Self-pay

## 2021-01-01 ENCOUNTER — Other Ambulatory Visit: Payer: Self-pay

## 2021-01-01 ENCOUNTER — Telehealth (HOSPITAL_COMMUNITY): Payer: Self-pay | Admitting: Emergency Medicine

## 2021-01-01 NOTE — Telephone Encounter (Signed)
Reaching out to patient to offer assistance regarding upcoming cardiac imaging study; pt verbalizes understanding of appt date/time, parking situation and where to check in, pre-test NPO status and medications ordered, and verified current allergies; name and call back number provided for further questions should they arise Marchia Bond RN Navigator Cardiac Imaging [facility] [phone] office [phone] cell  Arrival time 1030a Denies iv issues 100mg  metoprolol tart

## 2021-01-02 ENCOUNTER — Encounter (HOSPITAL_COMMUNITY): Payer: Self-pay

## 2021-01-02 ENCOUNTER — Ambulatory Visit (HOSPITAL_COMMUNITY)
Admission: RE | Admit: 2021-01-02 | Discharge: 2021-01-02 | Disposition: A | Discharge status: Home / Self Care | Payer: Self-pay | Source: Ambulatory Visit | Attending: Internal Medicine | Admitting: Internal Medicine

## 2021-01-02 ENCOUNTER — Other Ambulatory Visit: Payer: Self-pay

## 2021-01-02 DIAGNOSIS — R072 Precordial pain: Secondary | ICD-10-CM | POA: Insufficient documentation

## 2021-01-02 MED ORDER — IOHEXOL 350 MG/ML SOLN
95.0000 mL | Freq: Once | INTRAVENOUS | Status: AC | PRN
  Administered 2021-01-02: 95 mL via INTRAVENOUS

## 2021-01-02 MED ORDER — NITROGLYCERIN 0.4 MG SL SUBL
0.8000 mg | SUBLINGUAL_TABLET | Freq: Once | SUBLINGUAL | Status: AC
  Administered 2021-01-02: 0.8 mg via SUBLINGUAL

## 2021-01-02 MED ORDER — NITROGLYCERIN 0.4 MG SL SUBL
SUBLINGUAL_TABLET | SUBLINGUAL | Status: AC
  Filled 2021-01-02: qty 2

## 2021-01-16 ENCOUNTER — Other Ambulatory Visit (HOSPITAL_COMMUNITY): Payer: Self-pay

## 2021-01-22 ENCOUNTER — Other Ambulatory Visit: Payer: Self-pay

## 2021-01-25 ENCOUNTER — Ambulatory Visit: Payer: Self-pay | Admitting: Nurse Practitioner

## 2021-02-12 ENCOUNTER — Other Ambulatory Visit: Payer: Self-pay

## 2021-02-12 ENCOUNTER — Encounter: Payer: Self-pay | Admitting: Nurse Practitioner

## 2021-02-12 ENCOUNTER — Telehealth: Payer: Self-pay | Admitting: Nurse Practitioner

## 2021-02-12 ENCOUNTER — Ambulatory Visit (HOSPITAL_BASED_OUTPATIENT_CLINIC_OR_DEPARTMENT_OTHER): Payer: Self-pay | Admitting: Nurse Practitioner

## 2021-02-12 DIAGNOSIS — E785 Hyperlipidemia, unspecified: Secondary | ICD-10-CM

## 2021-02-12 DIAGNOSIS — I10 Essential (primary) hypertension: Secondary | ICD-10-CM

## 2021-02-12 DIAGNOSIS — R7303 Prediabetes: Secondary | ICD-10-CM

## 2021-02-12 MED ORDER — HYDROCHLOROTHIAZIDE 25 MG PO TABS
ORAL_TABLET | Freq: Every day | ORAL | 1 refills | Status: DC
  Filled 2021-02-12: qty 90, fill #0
  Filled 2021-03-06: qty 30, 30d supply, fill #0
  Filled 2021-04-04: qty 30, 30d supply, fill #1
  Filled 2021-05-09: qty 30, 30d supply, fill #2
  Filled 2021-06-17: qty 30, 30d supply, fill #3
  Filled 2021-07-18: qty 30, 30d supply, fill #4
  Filled 2021-08-13: qty 30, 30d supply, fill #5

## 2021-02-12 MED ORDER — AMLODIPINE BESYLATE 10 MG PO TABS
ORAL_TABLET | Freq: Every day | ORAL | 1 refills | Status: DC
  Filled 2021-02-12: qty 90, fill #0
  Filled 2021-05-09: qty 90, 90d supply, fill #0
  Filled 2021-08-22: qty 90, 90d supply, fill #1

## 2021-02-12 NOTE — Progress Notes (Signed)
Virtual Visit via Telephone Note Due to national recommendations of social distancing due to [place] 19, telehealth visit is felt to be most appropriate for this patient at this time.  I discussed the limitations, risks, security and privacy concerns of performing an evaluation and management service by telephone and the availability of in person appointments. I also discussed with the patient that there may be a patient responsible charge related to this service. The patient expressed understanding and agreed to proceed.    I connected with Nina Hopkins on 02/12/21  at   9:30 AM EST  EDT by telephone and verified that I am speaking with the correct person using two identifiers.  Location of Patient: Private Residence   Location of Provider: [place] [facility] [employer] Office    Persons participating in Telemedicine visit: Nina Rankins FNP-BC [place]    History of Present Illness: Telemedicine visit for: Hypertension She has a past medical history of Anemia, Anxiety (~ 2008),  Hyperlipidemia, Hypertension,   HTN Blood pressure is well controlled.  She does not monitor her blood pressure at home as her monitor is currently not functioning.  She is requesting refill of hydrochlorothiazide 25 mg today.  She endorses adherence with amlodipine 10 mg daily  BP Readings from Last 3 Encounters:  01/02/21 107/78  12/19/20 110/80  10/26/20 110/77   Prediabetes Currently well controlled with diet only.  Lipids not at goal.  She is currently taking omega-3 fish oil over-the-counter.  May need to look at statin in the near future. Lab Results  Component Value Date   HGBA1C 5.7 (H) 07/20/2020    Lab Results  Component Value Date   LDLCALC 124 (H) 07/20/2020    The 10-year ASCVD risk score (Arnett DK, et al., 2019) is: 3%   Values used to calculate the score:     Age: 61 years     Sex: Female     Is Non-Hispanic African American: Yes     Diabetic: No      Tobacco smoker: No     Systolic Blood Pressure: 657 mmHg     Is BP treated: Yes     HDL Cholesterol: 69 mg/dL     Total Cholesterol: 208 mg/dL  Past Medical History:  Diagnosis Date   Anemia    Anxiety ~ 2008   no meds   Dyspnea    hx - r/t anemia per patient   Family history of adverse reaction to anesthesia    "my sister had a hard time waking up"   History of blood transfusion 12/20/2014   related to anemia   Hyperlipidemia    Hypertension    Missed ab    x 1 - no surgery required   SVD (spontaneous vaginal delivery)    x 2    Past Surgical History:  Procedure Laterality Date   ABDOMINAL HYSTERECTOMY N/A 03/17/2017   Procedure: HYSTERECTOMY ABDOMINAL;  Surgeon: Emily Filbert, MD;  Location: [facility];  Service: Gynecology;  Laterality: N/A;   CERVIX LESION DESTRUCTION     cryro   CYSTOSCOPY N/A 03/17/2017   Procedure: CYSTOSCOPY;  Surgeon: Emily Filbert, MD;  Location: [facility];  Service: Gynecology;  Laterality: N/A;   DILATION AND CURETTAGE, DIAGNOSTIC / THERAPEUTIC      in office   SALPINGOOPHORECTOMY Bilateral 03/17/2017   Procedure: SALPINGO OOPHORECTOMY;  Surgeon: Emily Filbert, MD;  Location: [facility];  Service: Gynecology;  Laterality: Bilateral;   TUBAL LIGATION  1990's  Family History  Problem Relation Age of Onset   Cancer Mother        BRAIN TUMOR, VULVAR   Hypertension Mother    Diabetes Mother    Cancer Father        PENILE   Cancer Maternal Grandmother        LUNG   Cancer Maternal Grandfather        COLON   Diabetes Sister    Hypertension Sister    Heart disease Sister     Social History   Socioeconomic History   Marital status: Divorced    Spouse name: Not on file   Number of children: 2   Years of education: Not on file   Highest education level: High school graduate  Occupational History   Not on file  Tobacco Use   Smoking status: Never   Smokeless tobacco: Never  Vaping Use   Vaping Use: Never used  Substance and Sexual Activity    Alcohol use: Yes    Comment: occasionally    Drug use: No   Sexual activity: Not Currently    Birth control/protection: None  Other Topics Concern   Not on file  Social History Narrative   Not on file   Social Determinants of Health   Financial Resource Strain: Not on file  Food Insecurity: Not on file  Transportation Needs: Not on file  Physical Activity: Not on file  Stress: Not on file  Social Connections: Not on file     Observations/Objective: Awake, alert and oriented x 3   Review of Systems  Constitutional:  Negative for fever, malaise/fatigue and weight loss.  HENT: Negative.  Negative for nosebleeds.   Eyes: Negative.  Negative for blurred vision, double vision and photophobia.  Respiratory: Negative.  Negative for cough and shortness of breath.   Cardiovascular: Negative.  Negative for chest pain, palpitations and leg swelling.  Gastrointestinal: Negative.  Negative for heartburn, nausea and vomiting.  Musculoskeletal: Negative.  Negative for myalgias.  Neurological: Negative.  Negative for dizziness, focal weakness, seizures and headaches.  Psychiatric/Behavioral: Negative.  Negative for suicidal ideas.    Assessment and Plan: Diagnoses and all orders for this visit:  Primary hypertension -     amLODipine (NORVASC) 10 MG tablet; TAKE 1 TABLET (10 MG TOTAL) BY MOUTH DAILY. -     hydrochlorothiazide (HYDRODIURIL) 25 MG tablet; TAKE 1 TABLET (25 MG TOTAL) BY MOUTH DAILY.  Dyslipidemia, goal LDL below 70 -     Lipid panel  Prediabetes -     Hemoglobin A1c     Follow Up Instructions Return in about 3 months (around 05/13/2021).     I discussed the assessment and treatment plan with the patient. The patient was provided an opportunity to ask questions and all were answered. The patient agreed with the plan and demonstrated an understanding of the instructions.   The patient was advised to call back or seek an in-person evaluation if the symptoms worsen or if  the condition fails to improve as anticipated.  I provided 13 minutes of non-face-to-face time during this encounter including median intraservice time, reviewing previous notes, labs, imaging, medications and explaining diagnosis and management.  Gildardo Pounds, FNP-BC

## 2021-02-12 NOTE — Telephone Encounter (Signed)
No answer. LVM ?

## 2021-02-13 ENCOUNTER — Encounter (HOSPITAL_COMMUNITY): Payer: Self-pay | Admitting: Internal Medicine

## 2021-02-13 ENCOUNTER — Ambulatory Visit (HOSPITAL_COMMUNITY): Payer: Self-pay | Attending: Cardiology

## 2021-02-14 ENCOUNTER — Encounter: Payer: Self-pay | Admitting: Nurse Practitioner

## 2021-03-06 ENCOUNTER — Ambulatory Visit: Payer: Self-pay | Attending: Nurse Practitioner

## 2021-03-06 ENCOUNTER — Other Ambulatory Visit: Payer: Self-pay

## 2021-03-06 DIAGNOSIS — Z23 Encounter for immunization: Secondary | ICD-10-CM

## 2021-03-07 LAB — LIPID PANEL
Chol/HDL Ratio: 3.5 ratio (ref 0.0–4.4)
Cholesterol, Total: 226 mg/dL — ABNORMAL HIGH (ref 100–199)
HDL: 65 mg/dL (ref >39)
LDL Chol Calc (NIH): 132 mg/dL — ABNORMAL HIGH (ref 0–99)
Triglycerides: 167 mg/dL — ABNORMAL HIGH (ref 0–149)
VLDL Cholesterol Cal: 29 mg/dL (ref 5–40)

## 2021-03-07 LAB — HEMOGLOBIN A1C
Est. average glucose Bld gHb Est-mCnc: 126 mg/dL
Hgb A1c MFr Bld: 6 % — ABNORMAL HIGH (ref 4.8–5.6)

## 2021-04-04 ENCOUNTER — Other Ambulatory Visit: Payer: Self-pay

## 2021-05-09 ENCOUNTER — Other Ambulatory Visit (HOSPITAL_COMMUNITY): Payer: Self-pay

## 2021-05-14 ENCOUNTER — Other Ambulatory Visit (HOSPITAL_COMMUNITY): Payer: Self-pay

## 2021-06-06 ENCOUNTER — Telehealth: Payer: Self-pay | Admitting: Family Medicine

## 2021-06-06 ENCOUNTER — Other Ambulatory Visit: Payer: Self-pay

## 2021-06-06 ENCOUNTER — Ambulatory Visit: Payer: Self-pay

## 2021-06-06 DIAGNOSIS — J069 Acute upper respiratory infection, unspecified: Secondary | ICD-10-CM

## 2021-06-06 DIAGNOSIS — R062 Wheezing: Secondary | ICD-10-CM

## 2021-06-06 MED ORDER — AMOXICILLIN-POT CLAVULANATE 875-125 MG PO TABS
1.0000 | ORAL_TABLET | Freq: Two times a day (BID) | ORAL | 0 refills | Status: AC
Start: 2021-06-06 — End: 2021-06-14
  Filled 2021-06-06: qty 14, 7d supply, fill #0

## 2021-06-06 MED ORDER — ALBUTEROL SULFATE HFA 108 (90 BASE) MCG/ACT IN AERS
1.0000 | INHALATION_SPRAY | Freq: Four times a day (QID) | RESPIRATORY_TRACT | 0 refills | Status: DC | PRN
  Filled 2021-06-06: qty 8.5, 25d supply, fill #0

## 2021-06-06 MED ORDER — BENZONATATE 100 MG PO CAPS
100.0000 mg | ORAL_CAPSULE | Freq: Three times a day (TID) | ORAL | 0 refills | Status: DC | PRN
  Filled 2021-06-06: qty 20, 7d supply, fill #0

## 2021-06-06 NOTE — Telephone Encounter (Signed)
?  Chief Complaint: congestion ?Symptoms: congestion, cough non productive, wheezing, runny nose\ ?Frequency: 1 week ?Pertinent Negatives: Patient denies SOB ?Disposition: '[]'$ ED /'[]'$ Urgent Care (no appt availability in office) / '[]'$ Appointment(In office/virtual)/ '[x]'$  [facility]/ '[]'$ Home Care/ '[]'$ Refused Recommended Disposition /'[]'$ [place] [facility]/ '[]'$  Follow-up with PCP ?Additional Notes: pt states since she has HTN she was afraid of taking something OTC. She tried coricidin HBP and hasn't noticed a difference. No appts with office available so scheduled for UC VV today at 1330. Instructed pt how to complete VV and if had any questions to call back. Pt verbalized understanding.  ? ?Reason for Disposition ? Wheezing is present ? ?Answer Assessment - Initial Assessment Questions ?1. ONSET: "When did the cough begin?"  ?    1 week  ?2. SEVERITY: "How bad is the cough today?"  ?    Mild to moderate ?3. SPUTUM: "Describe the color of your sputum" (none, dry cough; clear, white, yellow, green) ?    Non productive  ?5. DIFFICULTY BREATHING: "Are you having difficulty breathing?" If Yes, ask: "How bad is it?" (e.g., mild, moderate, severe)  ?  - MILD: No SOB at rest, mild SOB with walking, speaks normally in sentences, can lie down, no retractions, pulse < 100.  ?  - MODERATE: SOB at rest, SOB with minimal exertion and prefers to sit, cannot lie down flat, speaks in phrases, mild retractions, audible wheezing, pulse 100-120.  ?  - SEVERE: Very SOB at rest, speaks in single words, struggling to breathe, sitting hunched forward, retractions, pulse > 120  ?    Wheezing  ?6. FEVER: "Do you have a fever?" If Yes, ask: "What is your temperature, how was it measured, and when did it start?" ?    Not since 1st night ?10. OTHER SYMPTOMS: "Do you have any other symptoms?" (e.g., runny nose, wheezing, chest pain) ?      Runny nose, chest tightness, wheezing ? ?Protocols used: Cough - Acute Non-Productive-A-AH ? ?

## 2021-06-06 NOTE — Progress Notes (Signed)
?Virtual Visit Consent  ? ?[name], you are scheduled for a virtual visit with a [facility] provider today. Just as with appointments in the office, your consent must be obtained to participate. Your consent will be active for this visit and any virtual visit you may have with one of our providers in the next 365 days. If you have a MyChart account, a copy of this consent can be sent to you electronically. ? ?As this is a virtual visit, video technology does not allow for your provider to perform a traditional examination. This may limit your provider's ability to fully assess your condition. If your provider identifies any concerns that need to be evaluated in person or the need to arrange testing (such as labs, EKG, etc.), we will make arrangements to do so. Although advances in technology are sophisticated, we cannot ensure that it will always work on either your end or our end. If the connection with a video visit is poor, the visit may have to be switched to a telephone visit. With either a video or telephone visit, we are not always able to ensure that we have a secure connection. ? ?By engaging in this virtual visit, you consent to the provision of healthcare and authorize for your insurance to be billed (if applicable) for the services provided during this visit. Depending on your insurance coverage, you may receive a charge related to this service. ? ?I need to obtain your verbal consent now. Are you willing to proceed with your visit today? [name] has provided verbal consent on 06/06/2021 for a virtual visit (video or telephone). [name], NP ? ?Date: 06/06/2021 1:34 PM ? ?Virtual Visit via Video Note  ? ?Chelsea Aus, connected with  Nina Hopkins  ([id number], 11/14/1960) on 06/06/21 at  1:30 PM EDT by a video-enabled telemedicine application and verified that I am speaking with the correct person using two identifiers. ? ?Location: ?Patient: Virtual Visit  Location Patient: Home ?Provider: Virtual Visit Location Provider: Home Office ?  ?I discussed the limitations of evaluation and management by telemedicine and the availability of in person appointments. The patient expressed understanding and agreed to proceed.   ? ?History of Present Illness: ?Nina Hopkins is a 61 y.o. who identifies as a female who was assigned female at birth, and is being seen today for cough and congestion onset 8 days ago, had a cold about 2 weeks prior.  ? ?HPI: Cough ?This is a new problem. The current episode started 1 to 4 weeks ago. The problem has been gradually worsening. The problem occurs constantly. The cough is Non-productive (scant mucus). Associated symptoms include headaches, nasal congestion and wheezing. Pertinent negatives include no chest pain, chills, ear congestion, ear pain, fever, heartburn, hemoptysis, myalgias, postnasal drip, rash, rhinorrhea, sore throat, shortness of breath, sweats or weight loss. Nothing aggravates the symptoms. She has tried OTC cough suppressant for the symptoms. The treatment provided mild relief. Her past medical history is significant for bronchitis.   ?Problems:  ?Patient Active Problem List  ? Diagnosis Date Noted  ? Prediabetes 03/22/2020  ? Dyslipidemia, goal LDL below 70 03/22/2020  ? HTN (hypertension) 05/14/2017  ? Post-operative state 03/17/2017  ? Back pain, thoracic 12/21/2014  ? Anemia 12/20/2014  ? Dyspnea 12/20/2014  ?  ?Allergies:  ?Allergies  ?Allergen Reactions  ? Prilosec [Omeprazole]   ?  Stiff neck  ? ?Medications:  ?Current Outpatient Medications:  ?  amLODipine (NORVASC) 10 MG  tablet, TAKE 1 TABLET (10 MG TOTAL) BY MOUTH DAILY., Disp: 90 tablet, Rfl: 1 ?  docusate sodium (STOOL SOFTENER) 100 MG capsule, Take 100 mg by mouth every other day., Disp: , Rfl:  ?  gabapentin (NEURONTIN) 100 MG capsule, Take 1 capsule (100 mg total) by mouth at bedtime., Disp: 30 capsule, Rfl: 3 ?  hydrochlorothiazide (HYDRODIURIL) 25  MG tablet, TAKE 1 TABLET (25 MG TOTAL) BY MOUTH DAILY., Disp: 90 tablet, Rfl: 1 ?  meloxicam (MOBIC) 7.5 MG tablet, Take 1 tablet (7.5 mg total) by mouth daily., Disp: 30 tablet, Rfl: 1 ?  metoprolol tartrate (LOPRESSOR) 100 MG tablet, Take 1 tablet by mouth 2 hours prior to your cardiac ct, Disp: 1 tablet, Rfl: 0 ?  Multiple Vitamin (MULTIVITAMIN WITH MINERALS) TABS tablet, Take 1 tablet by mouth daily., Disp: , Rfl:  ?  Omega-3 Fatty Acids (FISH OIL) 1000 MG CAPS, Take 1 capsule by mouth daily., Disp: , Rfl:  ? ?Observations/Objective: ?Patient is well-developed, well-nourished in no acute distress.  ?Resting comfortably  at home.  ?Head is normocephalic, atraumatic.  ?No labored breathing.  ?Speech is clear and coherent with logical content.  ?Patient is alert and oriented at baseline.  ? ? ?Assessment and Plan: ? ?1. URI with cough and congestion ? ?- benzonatate (TESSALON) 100 MG capsule; Take 1 capsule (100 mg total) by mouth 3 (three) times daily as needed for cough.  Dispense: 20 capsule; Refill: 0 ?- amoxicillin-clavulanate (AUGMENTIN) 875-125 MG tablet; Take 1 tablet by mouth 2 (two) times daily for 7 days.  Dispense: 14 tablet; Refill: 0 ?- albuterol (VENTOLIN HFA) 108 (90 Base) MCG/ACT inhaler; Inhale 1-2 puffs into the lungs every 6 (six) hours as needed for wheezing or shortness of breath.  Dispense: 8 g; Refill: 0 ? ?2. Wheeze ? ?- albuterol (VENTOLIN HFA) 108 (90 Base) MCG/ACT inhaler; Inhale 1-2 puffs into the lungs every 6 (six) hours as needed for wheezing or shortness of breath.  Dispense: 8 g; Refill: 0 ? ? ?S&S consistent with URI turn bacterial from viral ?Tx as per above ?Inhaler for wheeze- no red flags for UC or ED eval at this time ? ? Reviewed side effects, risks and benefits of medication.   ? ?Patient acknowledged agreement and understanding of the plan.  ? ? ?Follow Up Instructions: ?I discussed the assessment and treatment plan with the patient. The patient was provided an opportunity  to ask questions and all were answered. The patient agreed with the plan and demonstrated an understanding of the instructions.  A copy of instructions were sent to the patient via MyChart unless otherwise noted below.  ? ? ?The patient was advised to call back or seek an in-person evaluation if the symptoms worsen or if the condition fails to improve as anticipated. ? ?Time:  ?I spent 15 minutes with the patient via telehealth technology discussing the above problems/concerns.   ? ?[name], NP ? ?

## 2021-06-06 NOTE — Telephone Encounter (Signed)
noted 

## 2021-06-06 NOTE — Patient Instructions (Signed)

## 2021-06-07 ENCOUNTER — Other Ambulatory Visit: Payer: Self-pay

## 2021-06-17 ENCOUNTER — Other Ambulatory Visit: Payer: Self-pay

## 2021-06-18 ENCOUNTER — Other Ambulatory Visit: Payer: Self-pay

## 2021-07-15 ENCOUNTER — Ambulatory Visit: Payer: Self-pay | Attending: Nurse Practitioner | Admitting: Nurse Practitioner

## 2021-07-15 ENCOUNTER — Encounter: Payer: Self-pay | Admitting: Nurse Practitioner

## 2021-07-15 VITALS — BP 129/83 | HR 78 | Resp 16 | Wt 201.0 lb

## 2021-07-15 DIAGNOSIS — Z1211 Encounter for screening for malignant neoplasm of colon: Secondary | ICD-10-CM

## 2021-07-15 DIAGNOSIS — R7303 Prediabetes: Secondary | ICD-10-CM

## 2021-07-15 DIAGNOSIS — E785 Hyperlipidemia, unspecified: Secondary | ICD-10-CM

## 2021-07-15 DIAGNOSIS — D649 Anemia, unspecified: Secondary | ICD-10-CM

## 2021-07-15 DIAGNOSIS — I10 Essential (primary) hypertension: Secondary | ICD-10-CM

## 2021-07-15 NOTE — Progress Notes (Signed)
Assessment & Plan:  Nina Hopkins was seen today for hypertension and medication refill.  Diagnoses and all orders for this visit:  Primary hypertension -     CMP14+EGFR  Prediabetes -     Hemoglobin A1c  Screening for colon cancer -     Fecal occult blood, imunochemical  Dyslipidemia, goal LDL below 100 -     Lipid panel  Anemia, unspecified type -     CBC    Patient has been counseled on age-appropriate routine health concerns for screening and prevention. These are reviewed and up-to-date. Referrals have been placed accordingly. Immunizations are up-to-date or declined.    Subjective:   Chief Complaint  Patient presents with   Hypertension   Medication Refill   HPI Nina Hopkins 61 y.o. female presents to office today for for follow up to HTN She has a past medical history of Anemia, Anxiety (~ 2008), Dyspnea, Hyperlipidemia, Hypertension  HTN Blood pressure currently at goal. Taking amlodipine 10 mg and HCTZ 25 mg daily.  BP Readings from Last 3 Encounters:  07/15/21 129/83  01/02/21 107/78  12/19/20 110/80     States she sometimes feels short of breath feels like "my breath gets stuck". Not associated with any chest pain. She does not have a history of asthma  or tobacco use.   Also gets choked on liquids and notes it is sometimes difficult to even swallow her saliva. This is not associated with eating food.   Prediabetes Well controlled with diet only at this time. LDL not at goal with omega 3 fish oil.  Lab Results  Component Value Date   HGBA1C 5.7 (H) 07/15/2021    Lab Results  Component Value Date   LDLCALC 136 (H) 07/15/2021  The 10-year ASCVD risk score (Arnett DK, et al., 2019) is: 6.5%   Values used to calculate the score:     Age: 73 years     Sex: Female     Is Non-Hispanic African American: Yes     Diabetic: No     Tobacco smoker: No     Systolic Blood Pressure: 254 mmHg     Is BP treated: Yes     HDL Cholesterol: 66 mg/dL      Total Cholesterol: 225 mg/dL     Review of Systems  Constitutional:  Negative for fever, malaise/fatigue and weight loss.  HENT:  Negative for nosebleeds.        SEE HPI  Eyes: Negative.  Negative for blurred vision, double vision and photophobia.  Respiratory: Negative.  Negative for cough and shortness of breath.   Cardiovascular: Negative.  Negative for chest pain, palpitations and leg swelling.  Gastrointestinal: Negative.  Negative for heartburn, nausea and vomiting.  Musculoskeletal: Negative.  Negative for myalgias.  Neurological: Negative.  Negative for dizziness, focal weakness, seizures and headaches.  Psychiatric/Behavioral: Negative.  Negative for suicidal ideas.     Past Medical History:  Diagnosis Date   Anemia    Anxiety ~ 2008   no meds   Dyspnea    hx - r/t anemia per patient   Family history of adverse reaction to anesthesia    "my sister had a hard time waking up"   History of blood transfusion 12/20/2014   related to anemia   Hyperlipidemia    Hypertension    Missed ab    x 1 - no surgery required   SVD (spontaneous vaginal delivery)    x 2    Past Surgical History:  Procedure Laterality Date   ABDOMINAL HYSTERECTOMY N/A 03/17/2017   Procedure: HYSTERECTOMY ABDOMINAL;  Surgeon: Emily Filbert, MD;  Location: [facility];  Service: Gynecology;  Laterality: N/A;   CERVIX LESION DESTRUCTION     cryro   CYSTOSCOPY N/A 03/17/2017   Procedure: CYSTOSCOPY;  Surgeon: Emily Filbert, MD;  Location: [facility];  Service: Gynecology;  Laterality: N/A;   DILATION AND CURETTAGE, DIAGNOSTIC / THERAPEUTIC      in office   SALPINGOOPHORECTOMY Bilateral 03/17/2017   Procedure: SALPINGO OOPHORECTOMY;  Surgeon: Emily Filbert, MD;  Location: [facility];  Service: Gynecology;  Laterality: Bilateral;   TUBAL LIGATION  57's    Family History  Problem Relation Age of Onset   Cancer Mother        BRAIN TUMOR, VULVAR   Hypertension Mother    Diabetes Mother    Cancer Father         PENILE   Cancer Maternal Grandmother        LUNG   Cancer Maternal Grandfather        COLON   Diabetes Sister    Hypertension Sister    Heart disease Sister     Social History Reviewed with no changes to be made today.   Outpatient Medications Prior to Visit  Medication Sig Dispense Refill   albuterol (VENTOLIN HFA) 108 (90 Base) MCG/ACT inhaler Inhale 1-2 puffs into the lungs every 6 (six) hours as needed for wheezing or shortness of breath. 8.5 g 0   amLODipine (NORVASC) 10 MG tablet TAKE 1 TABLET (10 MG TOTAL) BY MOUTH DAILY. 90 tablet 1   hydrochlorothiazide (HYDRODIURIL) 25 MG tablet TAKE 1 TABLET (25 MG TOTAL) BY MOUTH DAILY. 90 tablet 1   Multiple Vitamin (MULTIVITAMIN WITH MINERALS) TABS tablet Take 1 tablet by mouth daily.     Omega-3 Fatty Acids (FISH OIL) 1000 MG CAPS Take 1 capsule by mouth daily.     docusate sodium (STOOL SOFTENER) 100 MG capsule Take 100 mg by mouth every other day.     gabapentin (NEURONTIN) 100 MG capsule Take 1 capsule (100 mg total) by mouth at bedtime. (Patient not taking: Reported on 07/15/2021) 30 capsule 3   benzonatate (TESSALON) 100 MG capsule Take 1 capsule (100 mg total) by mouth 3 (three) times daily as needed for cough. (Patient not taking: Reported on 07/15/2021) 20 capsule 0   meloxicam (MOBIC) 7.5 MG tablet Take 1 tablet (7.5 mg total) by mouth daily. (Patient not taking: Reported on 07/15/2021) 30 tablet 1   metoprolol tartrate (LOPRESSOR) 100 MG tablet Take 1 tablet by mouth 2 hours prior to your cardiac ct (Patient not taking: Reported on 07/15/2021) 1 tablet 0   No facility-administered medications prior to visit.    Allergies  Allergen Reactions   Prilosec [Omeprazole]     Stiff neck       Objective:    BP 129/83 (BP Location: Left Arm, Patient Position: Sitting, Cuff Size: Large)   Pulse 78   Resp 16   Wt 201 lb (91.2 kg)   LMP 03/02/2017 (Approximate)   SpO2 100%   BMI 37.98 kg/m  Wt Readings from Last 3 Encounters:   07/15/21 201 lb (91.2 kg)  12/19/20 201 lb (91.2 kg)  10/26/20 200 lb 2 oz (90.8 kg)    Physical Exam Vitals and nursing note reviewed.  Constitutional:      Appearance: She is well-developed.  HENT:     Head: Normocephalic and atraumatic.  Cardiovascular:  Rate and Rhythm: Normal rate and regular rhythm.     Heart sounds: Normal heart sounds. No murmur heard.    No friction rub. No gallop.  Pulmonary:     Effort: Pulmonary effort is normal. No tachypnea or respiratory distress.     Breath sounds: Normal breath sounds. No decreased breath sounds, wheezing, rhonchi or rales.  Chest:     Chest wall: No tenderness.  Abdominal:     General: Bowel sounds are normal.     Palpations: Abdomen is soft.  Musculoskeletal:        General: Normal range of motion.     Cervical back: Normal range of motion.  Skin:    General: Skin is warm and dry.  Neurological:     Mental Status: She is alert and oriented to person, place, and time.     Coordination: Coordination normal.  Psychiatric:        Behavior: Behavior normal. Behavior is cooperative.        Thought Content: Thought content normal.        Judgment: Judgment normal.          Patient has been counseled extensively about nutrition and exercise as well as the importance of adherence with medications and regular follow-up. The patient was given clear instructions to go to ER or return to medical center if symptoms don't improve, worsen or new problems develop. The patient verbalized understanding.   Follow-up: Return in about 6 months (around 01/14/2022).   Gildardo Pounds, FNP-BC [facility] and [place], [place]   07/16/2021, 9:29 PM

## 2021-07-15 NOTE — Progress Notes (Signed)
Follow up HTN  Unable to take full breath  Trouble with swallowing

## 2021-07-16 ENCOUNTER — Encounter: Payer: Self-pay | Admitting: Nurse Practitioner

## 2021-07-16 LAB — CMP14+EGFR
ALT: 23 IU/L (ref 0–32)
AST: 24 IU/L (ref 0–40)
Albumin/Globulin Ratio: 1.4 (ref 1.2–2.2)
Albumin: 4.3 g/dL (ref 3.8–4.9)
Alkaline Phosphatase: 73 IU/L (ref 44–121)
BUN/Creatinine Ratio: 17 (ref 9–23)
BUN: 13 mg/dL (ref 6–24)
Bilirubin Total: 0.2 mg/dL (ref 0.0–1.2)
CO2: 24 mmol/L (ref 20–29)
Calcium: 9.4 mg/dL (ref 8.7–10.2)
Chloride: 103 mmol/L (ref 96–106)
Creatinine, Ser: 0.78 mg/dL (ref 0.57–1.00)
Globulin, Total: 3 g/dL (ref 1.5–4.5)
Glucose: 112 mg/dL — ABNORMAL HIGH (ref 70–99)
Potassium: 3.9 mmol/L (ref 3.5–5.2)
Sodium: 141 mmol/L (ref 134–144)
Total Protein: 7.3 g/dL (ref 6.0–8.5)
eGFR: 87 mL/min/{1.73_m2} (ref >59)

## 2021-07-16 LAB — CBC
Hematocrit: 36.5 % (ref 34.0–46.6)
Hemoglobin: 12.4 g/dL (ref 11.1–15.9)
MCH: 29.1 pg (ref 26.6–33.0)
MCHC: 34 g/dL (ref 31.5–35.7)
MCV: 86 fL (ref 79–97)
Platelets: 269 10*3/uL (ref 150–450)
RBC: 4.26 x10E6/uL (ref 3.77–5.28)
RDW: 12.1 % (ref 11.7–15.4)
WBC: 5.2 10*3/uL (ref 3.4–10.8)

## 2021-07-16 LAB — LIPID PANEL
Chol/HDL Ratio: 3.4 ratio (ref 0.0–4.4)
Cholesterol, Total: 225 mg/dL — ABNORMAL HIGH (ref 100–199)
HDL: 66 mg/dL
LDL Chol Calc (NIH): 136 mg/dL — ABNORMAL HIGH (ref 0–99)
Triglycerides: 133 mg/dL (ref 0–149)
VLDL Cholesterol Cal: 23 mg/dL (ref 5–40)

## 2021-07-16 LAB — HEMOGLOBIN A1C
Est. average glucose Bld gHb Est-mCnc: 117 mg/dL
Hgb A1c MFr Bld: 5.7 % — ABNORMAL HIGH (ref 4.8–5.6)

## 2021-07-19 ENCOUNTER — Other Ambulatory Visit: Payer: Self-pay

## 2021-08-13 ENCOUNTER — Other Ambulatory Visit (HOSPITAL_COMMUNITY): Payer: Self-pay

## 2021-08-23 ENCOUNTER — Other Ambulatory Visit (HOSPITAL_COMMUNITY): Payer: Self-pay

## 2021-09-10 ENCOUNTER — Other Ambulatory Visit: Payer: Self-pay | Admitting: Nurse Practitioner

## 2021-09-10 ENCOUNTER — Other Ambulatory Visit (HOSPITAL_COMMUNITY): Payer: Self-pay

## 2021-09-10 DIAGNOSIS — I10 Essential (primary) hypertension: Secondary | ICD-10-CM

## 2021-09-10 MED ORDER — HYDROCHLOROTHIAZIDE 25 MG PO TABS
25.0000 mg | ORAL_TABLET | Freq: Every day | ORAL | 1 refills | Status: DC
  Filled 2021-09-10: qty 90, 90d supply, fill #0

## 2021-09-10 NOTE — Telephone Encounter (Signed)
Requested Prescriptions  Pending Prescriptions Disp Refills  . hydrochlorothiazide (HYDRODIURIL) 25 MG tablet 90 tablet 1    Sig: TAKE 1 TABLET (25 MG TOTAL) BY MOUTH DAILY.     Cardiovascular: Diuretics - Thiazide Passed - 09/10/2021 10:26 AM      Passed - Cr in normal range and within 180 days    Creat  Date Value Ref Range Status  01/08/2015 0.74 0.50 - 1.05 mg/dL Final   Creatinine, Ser  Date Value Ref Range Status  07/15/2021 0.78 0.57 - 1.00 mg/dL Final         Passed - K in normal range and within 180 days    Potassium  Date Value Ref Range Status  07/15/2021 3.9 3.5 - 5.2 mmol/L Final         Passed - Na in normal range and within 180 days    Sodium  Date Value Ref Range Status  07/15/2021 141 134 - 144 mmol/L Final         Passed - Last BP in normal range    BP Readings from Last 1 Encounters:  07/15/21 129/83         Passed - Valid encounter within last 6 months    Recent Outpatient Visits          1 month ago Primary hypertension   [place], Vernia Buff, NP   7 months ago Primary hypertension   [place], Vernia Buff, NP   10 months ago Primary hypertension   [place], Vernia Buff, NP   1 year ago Arthralgia of multiple joints   [place], Vernia Buff, NP   1 year ago Prediabetes   [facility] [employer], Dionne Bucy, [place]      Future Appointments            In 4 months Gildardo Pounds, NP [place]

## 2021-09-11 ENCOUNTER — Other Ambulatory Visit (HOSPITAL_COMMUNITY): Payer: Self-pay

## 2021-09-12 ENCOUNTER — Other Ambulatory Visit: Payer: Self-pay | Admitting: Nurse Practitioner

## 2021-09-12 DIAGNOSIS — Z1231 Encounter for screening mammogram for malignant neoplasm of breast: Secondary | ICD-10-CM

## 2021-09-26 ENCOUNTER — Ambulatory Visit
Admission: RE | Admit: 2021-09-26 | Discharge: 2021-09-26 | Disposition: A | Discharge status: Home / Self Care | Payer: Self-pay | Source: Ambulatory Visit | Attending: Nurse Practitioner | Admitting: Nurse Practitioner

## 2021-09-26 DIAGNOSIS — Z1231 Encounter for screening mammogram for malignant neoplasm of breast: Secondary | ICD-10-CM

## 2021-10-01 ENCOUNTER — Other Ambulatory Visit: Payer: Self-pay | Admitting: Nurse Practitioner

## 2021-10-01 ENCOUNTER — Other Ambulatory Visit: Payer: Self-pay | Admitting: Obstetrics and Gynecology

## 2021-10-01 DIAGNOSIS — R928 Other abnormal and inconclusive findings on diagnostic imaging of breast: Secondary | ICD-10-CM

## 2021-10-17 ENCOUNTER — Ambulatory Visit: Payer: Self-pay | Admitting: Hematology and Oncology

## 2021-10-17 ENCOUNTER — Ambulatory Visit: Payer: Self-pay

## 2021-10-17 ENCOUNTER — Ambulatory Visit
Admission: RE | Admit: 2021-10-17 | Discharge: 2021-10-17 | Disposition: A | Discharge status: Home / Self Care | Payer: No Typology Code available for payment source | Source: Ambulatory Visit | Attending: Nurse Practitioner | Admitting: Nurse Practitioner

## 2021-10-17 VITALS — BP 138/92 | Wt 201.6 lb

## 2021-10-17 DIAGNOSIS — R928 Other abnormal and inconclusive findings on diagnostic imaging of breast: Secondary | ICD-10-CM

## 2021-10-17 DIAGNOSIS — N6489 Other specified disorders of breast: Secondary | ICD-10-CM

## 2021-10-17 NOTE — Progress Notes (Signed)
Ms. Nina Hopkins is a 61 y.o. female who presents to [facility] clinic today with no complaints . Screening mammogram revealed possible asymmetry of the left breast.    Pap Smear: Pap not smear completed today. Last Pap smear was 03/18/2015 at [facility] clinic and was normal. Per patient has no history of an abnormal Pap smear. Last Pap smear result is available in Epic. She had hysterectomy for AUB 03/17/2017.    Physical exam: Breasts Breasts symmetrical. No skin abnormalities bilateral breasts. No nipple retraction bilateral breasts. No nipple discharge bilateral breasts. No lymphadenopathy. No lumps palpated bilateral breasts.       Pelvic/Bimanual Pap is not indicated today    Smoking History: Patient has never smoked and was not referred to quit line.    Patient Navigation: Patient education provided. Access to services provided for patient through [facility] program. No interpreter provided. No transportation provided   Colorectal Cancer Screening: Per patient has never had colonoscopy completed No complaints today. FIT test given 07/15/2021; has not completed.    Breast and Cervical Cancer Risk Assessment: Patient has family history of breast cancer (maternal great aunt).Patient does not have history of cervical dysplasia, immunocompromised, or DES exposure in-utero. MS DIGITAL SCREENING TOMO BILATERAL  Result Date: 09/30/2021 CLINICAL DATA:  Screening. EXAM: DIGITAL SCREENING BILATERAL MAMMOGRAM WITH TOMOSYNTHESIS AND CAD TECHNIQUE: Bilateral screening digital craniocaudal and mediolateral oblique mammograms were obtained. Bilateral screening digital breast tomosynthesis was performed. The images were evaluated with computer-aided detection. COMPARISON:  Previous exam(s). ACR Breast Density Category b: There are scattered areas of fibroglandular density. FINDINGS: In the left breast, a possible asymmetry warrants further evaluation. In the right breast, no findings suspicious for  malignancy. IMPRESSION: Further evaluation is suggested for possible asymmetry in the left breast. RECOMMENDATION: Diagnostic mammogram and possibly ultrasound of the left breast. (Code:FI-L-49M) The patient will be contacted regarding the findings, and additional imaging will be scheduled. BI-RADS CATEGORY  0: Incomplete. Need additional imaging evaluation and/or prior mammograms for comparison. Electronically Signed   By: Ileana Roup M.D.   On: 09/30/2021 14:22   MS DIGITAL SCREENING TOMO BILATERAL  Result Date: 08/01/2019 CLINICAL DATA:  Screening. EXAM: DIGITAL SCREENING BILATERAL MAMMOGRAM WITH TOMO AND CAD COMPARISON:  Previous exam(s). ACR Breast Density Category b: There are scattered areas of fibroglandular density. FINDINGS: There are no findings suspicious for malignancy. Images were processed with CAD. IMPRESSION: No mammographic evidence of malignancy. A result letter of this screening mammogram will be mailed directly to the patient. RECOMMENDATION: Screening mammogram in one year. (Code:SM-B-01Y) BI-RADS CATEGORY  1: Negative. Electronically Signed   By: Dorise Bullion III M.D   On: 08/01/2019 12:29   MM DIAG BREAST TOMO UNI LEFT  Result Date: 06/25/2017 CLINICAL DATA:  Possible asymmetry in the central left breast in the oblique projection of a recent 3D screening mammogram. EXAM: DIGITAL DIAGNOSTIC UNILATERAL LEFT MAMMOGRAM WITH CAD AND TOMO COMPARISON:  Previous exam(s). ACR Breast Density Category c: The breast tissue is heterogeneously dense, which may obscure small masses. FINDINGS: 3D tomographic and 2D generated true lateral and spot compression oblique views of the left breast were obtained. These demonstrate normal appearing breast tissue at the location of recently suspected asymmetry. Mammographic images were processed with CAD. IMPRESSION: No evidence of malignancy. The recently suspected left breast asymmetry was close apposition of normal breast tissue. RECOMMENDATION:  Bilateral screening mammogram in 11 months. That will be 1 year since mammographic evaluation of the right breast. I have discussed the findings and  recommendations with the patient. Results were also provided in writing at the conclusion of the visit. If applicable, a reminder letter will be sent to the patient regarding the next appointment. BI-RADS CATEGORY  1: Negative. Electronically Signed   By: Claudie Revering M.D.   On: 06/25/2017 16:00   MS DIGITAL SCREENING TOMO BILATERAL  Result Date: 05/15/2017 CLINICAL DATA:  Screening. EXAM: DIGITAL SCREENING BILATERAL MAMMOGRAM WITH TOMO AND CAD COMPARISON:  Previous exam(s). ACR Breast Density Category c: The breast tissue is heterogeneously dense, which may obscure small masses. FINDINGS: In the left breast, a possible asymmetry warrants further evaluation. In the right breast, no findings suspicious for malignancy. Images were processed with CAD. IMPRESSION: Further evaluation is suggested for possible asymmetry in the left breast. RECOMMENDATION: Diagnostic mammogram and possibly ultrasound of the left breast. (Code:FI-L-76M) The patient will be contacted regarding the findings, and additional imaging will be scheduled. BI-RADS CATEGORY  0: Incomplete. Need additional imaging evaluation and/or prior mammograms for comparison. Electronically Signed   By: Ammie Ferrier M.D.   On: 05/15/2017 08:27     Risk Assessment   No risk assessment data for the current encounter  Risk Scores       07/28/2019   Last edited by: Loletta Parish, RN   5-year risk: 1.4 %   Lifetime risk: 7.5 %            A: B[facility]CP exam without pap smear Screening mammogram revealed possible asymmetry to the left breast. We will obtain diagnostic with ultrasound today.   P: Referred patient to the [facility] for a diagnostic mammogram. Appointment scheduled 10/17/2021.  Dayton Scrape A, NP 10/17/2021 8:32 AM

## 2021-12-25 ENCOUNTER — Other Ambulatory Visit: Payer: Self-pay | Admitting: Nurse Practitioner

## 2021-12-25 ENCOUNTER — Other Ambulatory Visit: Payer: Self-pay

## 2021-12-25 DIAGNOSIS — I10 Essential (primary) hypertension: Secondary | ICD-10-CM

## 2021-12-25 MED ORDER — AMLODIPINE BESYLATE 10 MG PO TABS
10.0000 mg | ORAL_TABLET | Freq: Every day | ORAL | 0 refills | Status: DC
  Filled 2021-12-25: qty 30, 30d supply, fill #0

## 2021-12-30 ENCOUNTER — Other Ambulatory Visit: Payer: Self-pay

## 2021-12-31 ENCOUNTER — Other Ambulatory Visit: Payer: Self-pay

## 2022-01-15 ENCOUNTER — Ambulatory Visit: Payer: Self-pay | Admitting: Nurse Practitioner

## 2022-01-17 ENCOUNTER — Encounter: Payer: Self-pay | Admitting: Nurse Practitioner

## 2022-01-17 ENCOUNTER — Other Ambulatory Visit: Payer: Self-pay

## 2022-01-17 ENCOUNTER — Ambulatory Visit: Payer: Self-pay | Attending: Nurse Practitioner | Admitting: Nurse Practitioner

## 2022-01-17 VITALS — BP 127/87 | HR 100 | Ht 61.0 in | Wt 208.4 lb

## 2022-01-17 DIAGNOSIS — I10 Essential (primary) hypertension: Secondary | ICD-10-CM

## 2022-01-17 DIAGNOSIS — Z23 Encounter for immunization: Secondary | ICD-10-CM

## 2022-01-17 DIAGNOSIS — R7303 Prediabetes: Secondary | ICD-10-CM

## 2022-01-17 LAB — POCT GLYCOSYLATED HEMOGLOBIN (HGB A1C): HbA1c, POC (controlled diabetic range): 5.6 % (ref 0.0–7.0)

## 2022-01-17 MED ORDER — CHLORTHALIDONE 25 MG PO TABS
25.0000 mg | ORAL_TABLET | Freq: Every day | ORAL | 1 refills | Status: DC
Start: 2022-01-17 — End: 2022-04-18
  Filled 2022-01-17 – 2022-01-21 (×2): qty 90, 90d supply, fill #0
  Filled 2022-04-16 (×2): qty 90, 90d supply, fill #1

## 2022-01-17 NOTE — Progress Notes (Signed)
Assessment & Plan:  Nina Hopkins was seen today for prediabetes and hypertension.  Diagnoses and all orders for this visit:  Primary hypertension DC amlodipine due to reported side effects by patient Switched HCTZ to chlorthalidone Return in 2 to 3 weeks for repeat BMP and also begin to log blood pressure readings and submit when she returns for lab work -     CMP14+EGFR -     chlorthalidone (HYGROTON) 25 MG tablet; Take 1 tablet (25 mg total) by mouth daily.  Prediabetes -     POCT glycosylated hemoglobin (Hb A1C) -     CMP14+EGFR Continue blood sugar control as discussed in office today, low carbohydrate diet, and regular physical exercise as tolerated, 150 minutes per week (30 min each day, 5 days per week, or 50 min 3 days per week).    Need for immunization against influenza -     Flu Vaccine QUAD 59moIM (Fluarix, Fluzone & Alfiuria Quad PF)    Patient has been counseled on age-appropriate routine health concerns for screening and prevention. These are reviewed and up-to-date. Referrals have been placed accordingly. Immunizations are up-to-date or declined.    Subjective:   Chief Complaint  Patient presents with   Prediabetes   Hypertension   HPI Nina Hopkins 61y.o. female presents to office today for follow-up to hypertension and prediabetes.  She has a past medical history of Anemia, Anxiety (~ 2008), Dyspnea,  Hyperlipidemia, Hypertension and prediabetes   Concerned that amlodipine may be causing her to fail short of breath. States "it feels like my breath gets cut short when I am trying to breath". She read that amlodipine can similar symptoms.    Patient has been counseled on age-appropriate routine health concerns for screening and prevention. These are reviewed and up-to-date. Referrals have been placed accordingly. Immunizations are up-to-date or declined.     Colonoscopy: She is turning in her fit test today Mammogram: Up-to-date  Pap smear: Total  abdominal hysterectomy 2019   Prediabetes Well-controlled with diet only at this time Lab Results  Component Value Date   HGBA1C 5.6 01/17/2022    HTN Blood pressure is well-controlled.  Will switch to chlorthalidone today as we are stopping amlodipine and she may need more blood pressure lowering that cannot be supported with hydrochlorothiazide BP Readings from Last 3 Encounters:  01/17/22 127/87  10/17/21 (!) 138/92  07/15/21 129/83     Review of Systems  Constitutional:  Negative for fever, malaise/fatigue and weight loss.  HENT: Negative.  Negative for nosebleeds.   Eyes: Negative.  Negative for blurred vision, double vision and photophobia.  Respiratory: Negative.  Negative for cough and shortness of breath.   Cardiovascular: Negative.  Negative for chest pain, palpitations and leg swelling.  Gastrointestinal: Negative.  Negative for heartburn, nausea and vomiting.  Musculoskeletal: Negative.  Negative for myalgias.  Neurological: Negative.  Negative for dizziness, focal weakness, seizures and headaches.  Psychiatric/Behavioral: Negative.  Negative for suicidal ideas.     Past Medical History:  Diagnosis Date   Anemia    Anxiety ~ 2008   no meds   Dyspnea    hx - r/t anemia per patient   Family history of adverse reaction to anesthesia    "my sister had a hard time waking up"   History of blood transfusion 12/20/2014   related to anemia   Hyperlipidemia    Hypertension    Missed ab    x 1 - no surgery required   SVD (  spontaneous vaginal delivery)    x 2    Past Surgical History:  Procedure Laterality Date   ABDOMINAL HYSTERECTOMY N/A 03/17/2017   Procedure: HYSTERECTOMY ABDOMINAL;  Surgeon: Emily Filbert, MD;  Location: [facility];  Service: Gynecology;  Laterality: N/A;   CERVIX LESION DESTRUCTION     cryro   CYSTOSCOPY N/A 03/17/2017   Procedure: CYSTOSCOPY;  Surgeon: Emily Filbert, MD;  Location: [facility];  Service: Gynecology;  Laterality: N/A;   DILATION AND  CURETTAGE, DIAGNOSTIC / THERAPEUTIC      in office   SALPINGOOPHORECTOMY Bilateral 03/17/2017   Procedure: SALPINGO OOPHORECTOMY;  Surgeon: Emily Filbert, MD;  Location: [facility];  Service: Gynecology;  Laterality: Bilateral;   TUBAL LIGATION  72's    Family History  Problem Relation Age of Onset   Cancer Mother        BRAIN TUMOR, VULVAR   Hypertension Mother    Diabetes Mother    Cancer Father        PENILE   Cancer Maternal Grandmother        LUNG   Cancer Maternal Grandfather        COLON   Diabetes Sister    Hypertension Sister    Heart disease Sister     Social History Reviewed with no changes to be made today.   Outpatient Medications Prior to Visit  Medication Sig Dispense Refill   albuterol (VENTOLIN HFA) 108 (90 Base) MCG/ACT inhaler Inhale 1-2 puffs into the lungs every 6 (six) hours as needed for wheezing or shortness of breath. 8.5 g 0   docusate sodium (STOOL SOFTENER) 100 MG capsule Take 100 mg by mouth every other day.     Multiple Vitamin (MULTIVITAMIN WITH MINERALS) TABS tablet Take 1 tablet by mouth daily.     Omega-3 Fatty Acids (FISH OIL) 1000 MG CAPS Take 1 capsule by mouth daily.     amLODipine (NORVASC) 10 MG tablet Take 1 tablet (10 mg total) by mouth daily. 30 tablet 0   hydrochlorothiazide (HYDRODIURIL) 25 MG tablet Take 1 tablet (25 mg total) by mouth daily. 90 tablet 1   gabapentin (NEURONTIN) 100 MG capsule Take 1 capsule (100 mg total) by mouth at bedtime. (Patient not taking: Reported on 07/15/2021) 30 capsule 3   No facility-administered medications prior to visit.    Allergies  Allergen Reactions   Prilosec [Omeprazole]     Stiff neck       Objective:    BP 127/87   Pulse 100   Ht _0  (1.549 m)   Wt 208 lb 6.4 oz (94.5 kg)   LMP 03/02/2017 (Approximate)   SpO2 98%   BMI 39.38 kg/m  Wt Readings from Last 3 Encounters:  01/17/22 208 lb 6.4 oz (94.5 kg)  10/17/21 201 lb 9.6 oz (91.4 kg)  07/15/21 201 lb (91.2 kg)    Physical  Exam Vitals and nursing note reviewed.  Constitutional:      Appearance: She is well-developed.  HENT:     Head: Normocephalic and atraumatic.  Cardiovascular:     Rate and Rhythm: Regular rhythm. Tachycardia present.     Heart sounds: Normal heart sounds. No murmur heard.    No friction rub. No gallop.  Pulmonary:     Effort: Pulmonary effort is normal. No tachypnea or respiratory distress.     Breath sounds: Normal breath sounds. No decreased breath sounds, wheezing, rhonchi or rales.  Chest:     Chest wall: No tenderness.  Abdominal:     General: Bowel sounds are normal.     Palpations: Abdomen is soft.  Musculoskeletal:        General: Normal range of motion.     Cervical back: Normal range of motion.  Skin:    General: Skin is warm and dry.  Neurological:     Mental Status: She is alert and oriented to person, place, and time.     Coordination: Coordination normal.  Psychiatric:        Behavior: Behavior normal. Behavior is cooperative.        Thought Content: Thought content normal.        Judgment: Judgment normal.          Patient has been counseled extensively about nutrition and exercise as well as the importance of adherence with medications and regular follow-up. The patient was given clear instructions to go to ER or return to medical center if symptoms don't improve, worsen or new problems develop. The patient verbalized understanding.   Follow-up: Return for lab appt in 2 weeks on a friday. See me in 3 months.   Gildardo Pounds, FNP-BC [facility] and [place], [place]   01/17/2022, 2:48 PM

## 2022-01-18 LAB — CMP14+EGFR
ALT: 25 IU/L (ref 0–32)
AST: 20 IU/L (ref 0–40)
Albumin/Globulin Ratio: 1.4 (ref 1.2–2.2)
Albumin: 4.4 g/dL (ref 3.8–4.9)
Alkaline Phosphatase: 73 IU/L (ref 44–121)
BUN/Creatinine Ratio: 12 (ref 9–23)
BUN: 9 mg/dL (ref 6–24)
Bilirubin Total: 0.3 mg/dL (ref 0.0–1.2)
CO2: 25 mmol/L (ref 20–29)
Calcium: 9.5 mg/dL (ref 8.7–10.2)
Chloride: 101 mmol/L (ref 96–106)
Creatinine, Ser: 0.77 mg/dL (ref 0.57–1.00)
Globulin, Total: 3.2 g/dL (ref 1.5–4.5)
Glucose: 90 mg/dL (ref 70–99)
Potassium: 3.8 mmol/L (ref 3.5–5.2)
Sodium: 142 mmol/L (ref 134–144)
Total Protein: 7.6 g/dL (ref 6.0–8.5)
eGFR: 89 mL/min/{1.73_m2} (ref >59)

## 2022-01-19 LAB — FECAL OCCULT BLOOD, IMMUNOCHEMICAL: Fecal Occult Bld: NEGATIVE

## 2022-01-21 ENCOUNTER — Other Ambulatory Visit: Payer: Self-pay

## 2022-01-23 ENCOUNTER — Other Ambulatory Visit: Payer: Self-pay

## 2022-01-31 ENCOUNTER — Ambulatory Visit: Payer: Self-pay

## 2022-02-12 ENCOUNTER — Ambulatory Visit: Payer: Self-pay | Attending: Nurse Practitioner

## 2022-02-12 DIAGNOSIS — I10 Essential (primary) hypertension: Secondary | ICD-10-CM

## 2022-02-13 LAB — BASIC METABOLIC PANEL
BUN/Creatinine Ratio: 15 (ref 9–23)
BUN: 12 mg/dL (ref 6–24)
CO2: 28 mmol/L (ref 20–29)
Calcium: 9.4 mg/dL (ref 8.7–10.2)
Chloride: 100 mmol/L (ref 96–106)
Creatinine, Ser: 0.79 mg/dL (ref 0.57–1.00)
Glucose: 93 mg/dL (ref 70–99)
Potassium: 3.5 mmol/L (ref 3.5–5.2)
Sodium: 141 mmol/L (ref 134–144)
eGFR: 86 mL/min/{1.73_m2} (ref >59)

## 2022-04-01 IMAGING — DX DG SHOULDER 2+V*L*
3 series · 3 of 3 positions shown · non-contrast
Comparison: Chest x-ray 10/01/2016.

CLINICAL DATA: Chronic shoulder pain.

EXAM:
LEFT SHOULDER - 2+ VIEW

[w shoulder internal left]
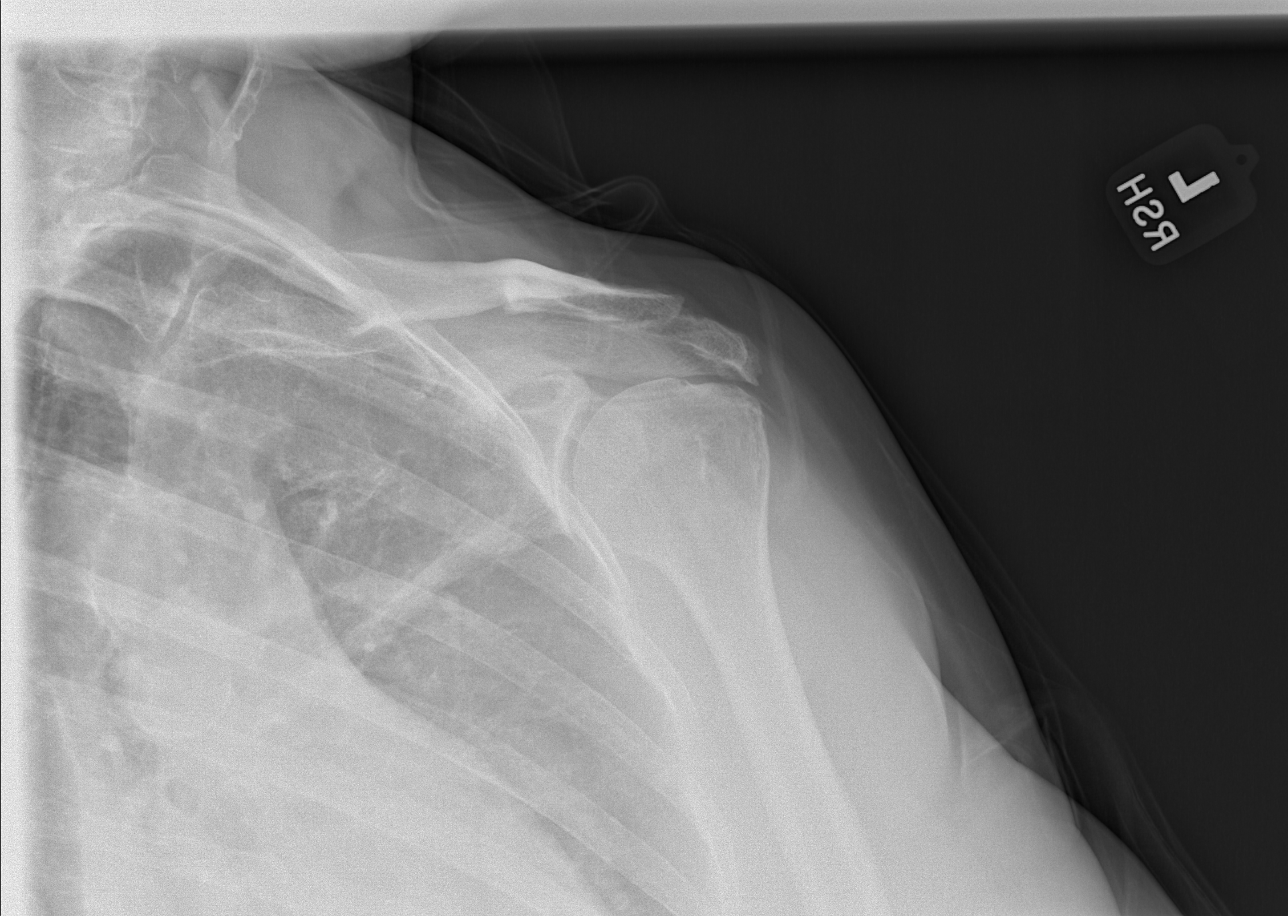

[w shoulder y-view left]
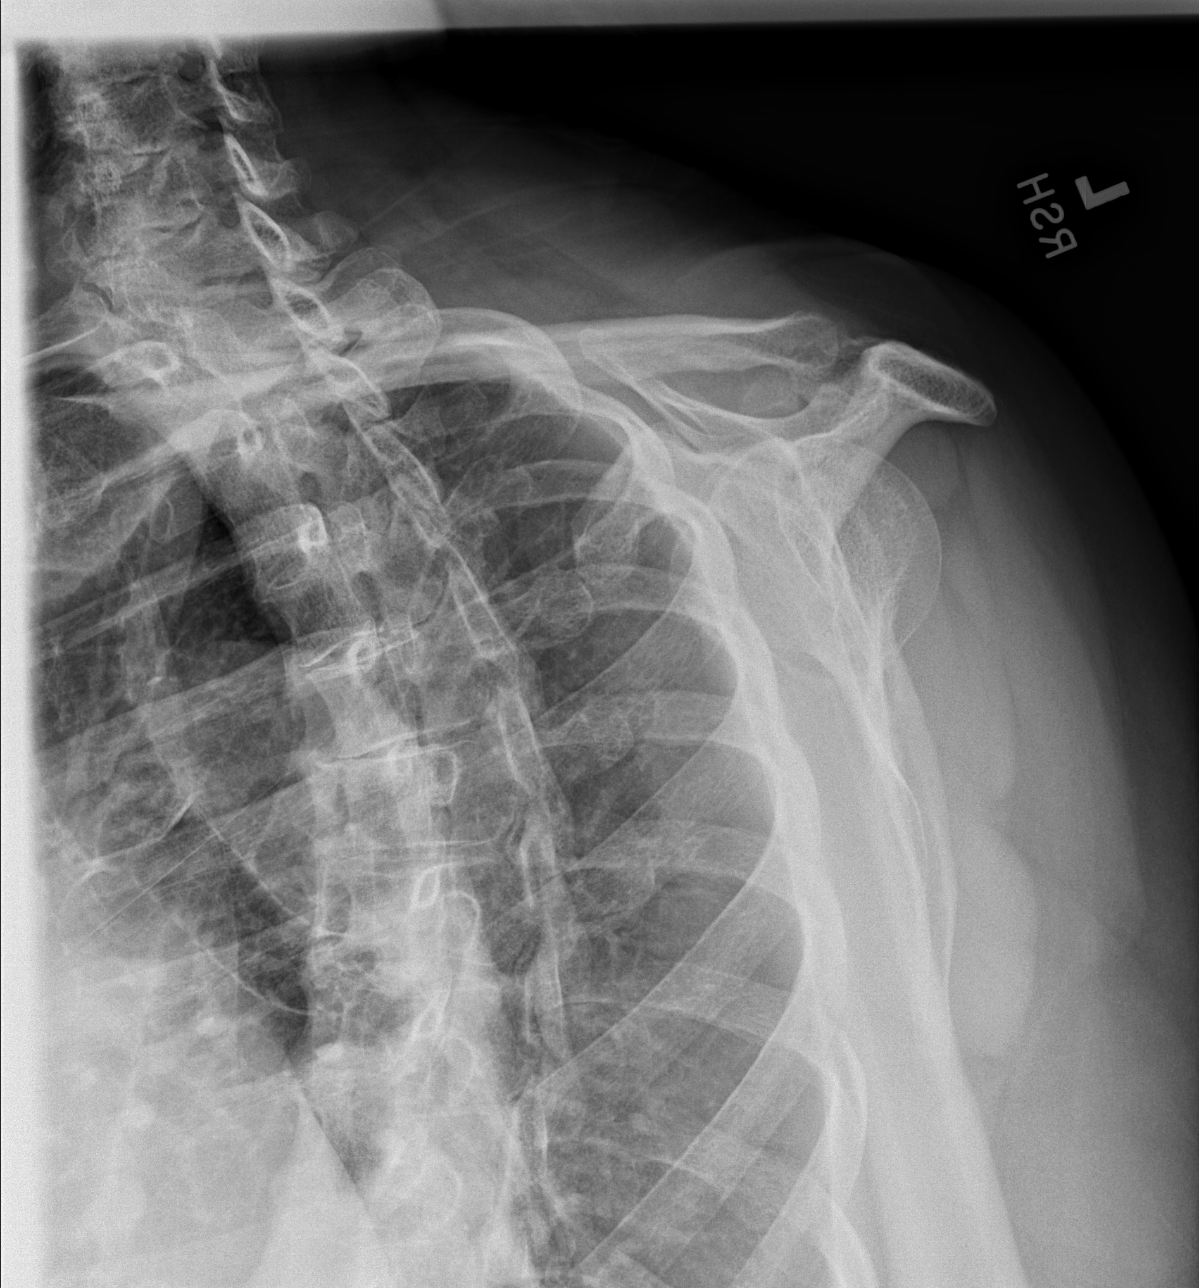

[x shoulder axillary left]
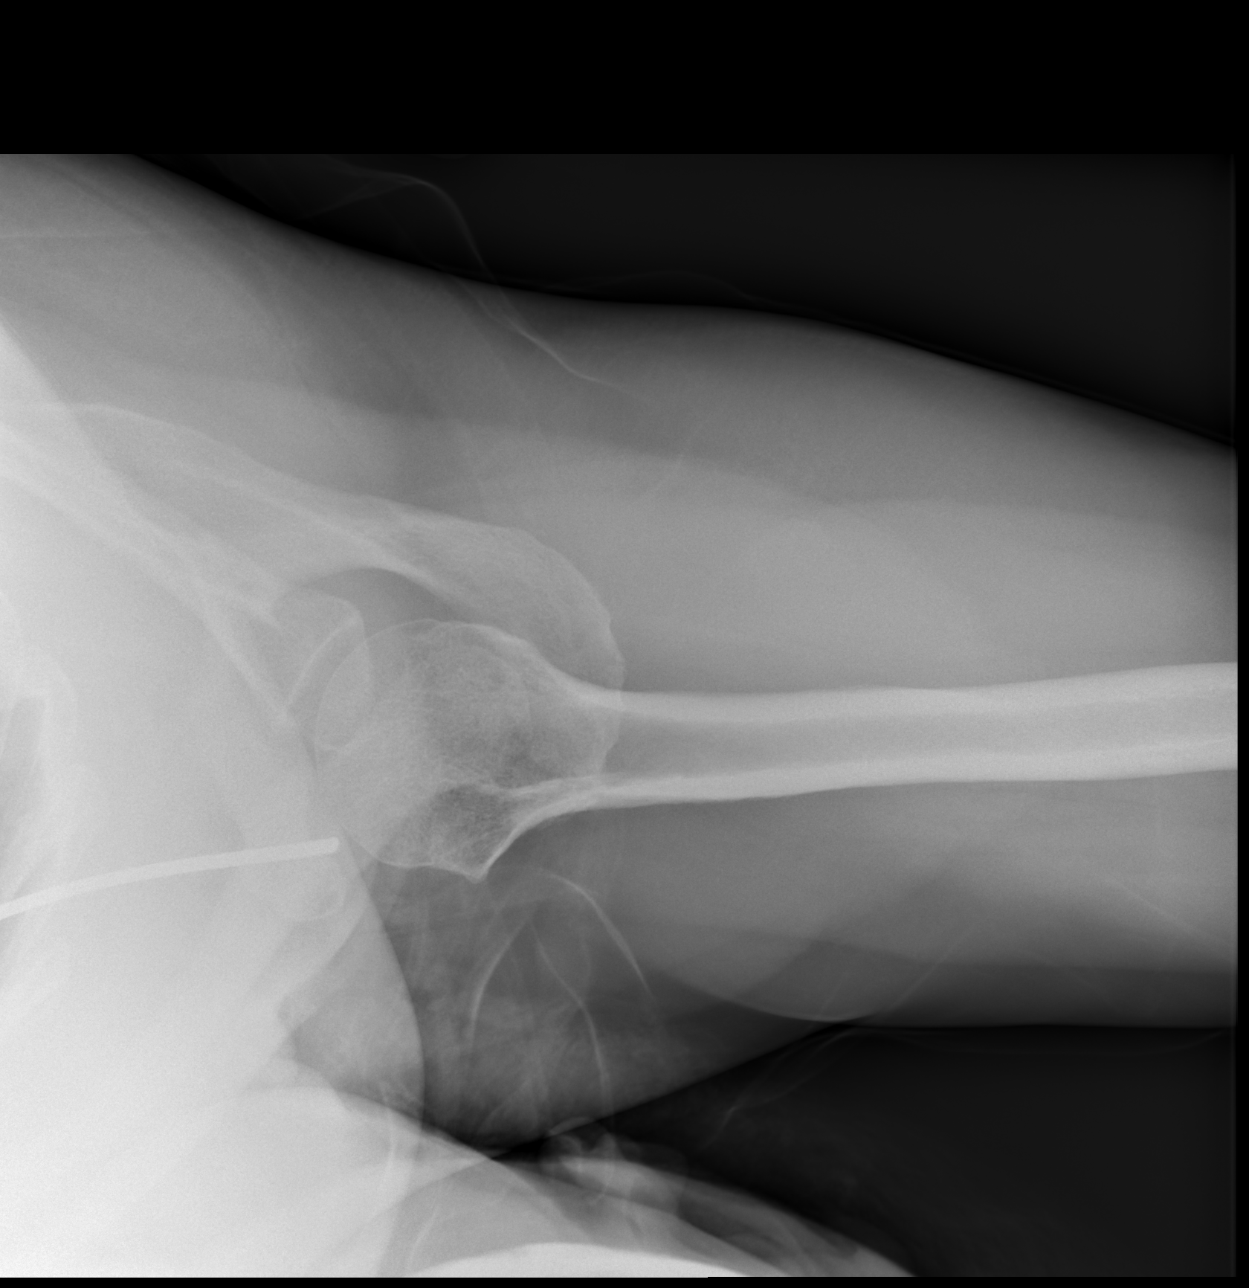

[3 of 3 positions shown; findings below may reference images not displayed]

FINDINGS: There is no evidence of fracture or dislocation. There is no
evidence of arthropathy or other focal bone abnormality. Soft
tissues are unremarkable.
IMPRESSION: Negative.

## 2022-04-16 ENCOUNTER — Other Ambulatory Visit: Payer: Self-pay

## 2022-04-18 ENCOUNTER — Other Ambulatory Visit: Payer: Self-pay

## 2022-04-18 ENCOUNTER — Ambulatory Visit: Payer: Self-pay | Attending: Nurse Practitioner | Admitting: Nurse Practitioner

## 2022-04-18 ENCOUNTER — Encounter: Payer: Self-pay | Admitting: Nurse Practitioner

## 2022-04-18 VITALS — BP 127/83 | HR 69 | Ht 61.0 in | Wt 213.0 lb

## 2022-04-18 DIAGNOSIS — M25662 Stiffness of left knee, not elsewhere classified: Secondary | ICD-10-CM

## 2022-04-18 DIAGNOSIS — I10 Essential (primary) hypertension: Secondary | ICD-10-CM

## 2022-04-18 MED ORDER — CHLORTHALIDONE 25 MG PO TABS
25.0000 mg | ORAL_TABLET | Freq: Every day | ORAL | 1 refills | Status: DC
  Filled 2022-06-17 – 2022-08-26 (×4): qty 90, 90d supply, fill #0
  Filled 2022-11-28: qty 90, 90d supply, fill #1

## 2022-04-18 NOTE — Progress Notes (Signed)
Assessment & Plan:  Magline was seen today for hypertension.  Diagnoses and all orders for this visit:  Primary hypertension -     chlorthalidone (HYGROTON) 25 MG tablet; Take 1 tablet (25 mg total) by mouth daily. Instructed to take 12.5 mg of chlorthalidone in a.m. and 12.5 mg of chlorthalidone in the p.m. DASH diet and avoid high sodium/processed foods  Joint stiffness of left lower leg Ways to counteract this include weight loss, weight bearing exercises, stretching exercises.     Patient has been counseled on age-appropriate routine health concerns for screening and prevention. These are reviewed and up-to-date. Referrals have been placed accordingly. Immunizations are up-to-date or declined.    Subjective:   Chief Complaint  Patient presents with   Hypertension   HPI Nina Hopkins 62 y.o. female presents to office today for follow up to HTN  HTN Blood pressure is well-controlled however she does report increased diastolic blood pressures into the 90s with home readings.  Notes higher diastolic readings usually occur in the evenings/nighttime.  She is taking chlorthalidone 25 mg daily. BP Readings from Last 3 Encounters:  04/18/22 127/83  01/17/22 127/87  10/17/21 (!) 138/92      Endorses left leg stiffness which is felt when attempting to enter/exit automobiles.  There is no pain or joint instability with weightbearing, walking, standing or prolonged sitting.  Pain is described as stiffness only. She states she has to lift her leg to get in and out of the car due to the stiffness. Duration of stiffness is only during exiting and entering.   Review of Systems  Constitutional:  Negative for fever, malaise/fatigue and weight loss.  HENT: Negative.  Negative for nosebleeds.   Eyes: Negative.  Negative for blurred vision, double vision and photophobia.  Respiratory: Negative.  Negative for cough and shortness of breath.   Cardiovascular: Negative.  Negative for  chest pain, palpitations and leg swelling.  Gastrointestinal: Negative.  Negative for heartburn, nausea and vomiting.  Musculoskeletal:        SEE HPI  Neurological: Negative.  Negative for dizziness, focal weakness, seizures and headaches.  Psychiatric/Behavioral: Negative.  Negative for suicidal ideas.     Past Medical History:  Diagnosis Date   Anemia    Anxiety ~ 2008   no meds   Dyspnea    hx - r/t anemia per patient   Family history of adverse reaction to anesthesia    "my sister had a hard time waking up"   History of blood transfusion 12/20/2014   related to anemia   Hyperlipidemia    Hypertension    Missed ab    x 1 - no surgery required   SVD (spontaneous vaginal delivery)    x 2    Past Surgical History:  Procedure Laterality Date   ABDOMINAL HYSTERECTOMY N/A 03/17/2017   Procedure: HYSTERECTOMY ABDOMINAL;  Surgeon: Emily Filbert, MD;  Location: [facility];  Service: Gynecology;  Laterality: N/A;   CERVIX LESION DESTRUCTION     cryro   CYSTOSCOPY N/A 03/17/2017   Procedure: CYSTOSCOPY;  Surgeon: Emily Filbert, MD;  Location: [facility];  Service: Gynecology;  Laterality: N/A;   DILATION AND CURETTAGE, DIAGNOSTIC / THERAPEUTIC      in office   SALPINGOOPHORECTOMY Bilateral 03/17/2017   Procedure: SALPINGO OOPHORECTOMY;  Surgeon: Emily Filbert, MD;  Location: [facility];  Service: Gynecology;  Laterality: Bilateral;   TUBAL LIGATION  1990's    Family History  Problem Relation Age of  Onset   Cancer Mother        BRAIN TUMOR, VULVAR   Hypertension Mother    Diabetes Mother    Cancer Father        PENILE   Cancer Maternal Grandmother        LUNG   Cancer Maternal Grandfather        COLON   Diabetes Sister    Hypertension Sister    Heart disease Sister     Social History Reviewed with no changes to be made today.   Outpatient Medications Prior to Visit  Medication Sig Dispense Refill   albuterol (VENTOLIN HFA) 108 (90 Base) MCG/ACT inhaler Inhale 1-2 puffs into the  lungs every 6 (six) hours as needed for wheezing or shortness of breath. 8.5 g 0   docusate sodium (STOOL SOFTENER) 100 MG capsule Take 100 mg by mouth every other day.     Multiple Vitamin (MULTIVITAMIN WITH MINERALS) TABS tablet Take 1 tablet by mouth daily.     Omega-3 Fatty Acids (FISH OIL) 1000 MG CAPS Take 1 capsule by mouth daily.     chlorthalidone (HYGROTON) 25 MG tablet Take 1 tablet (25 mg total) by mouth daily. 90 tablet 1   No facility-administered medications prior to visit.    Allergies  Allergen Reactions   Prilosec [Omeprazole]     Stiff neck       Objective:    BP 127/83   Pulse 69   Ht 5\' 1"  (1.549 m)   Wt 213 lb (96.6 kg)   LMP 03/02/2017 (Approximate)   SpO2 99%   BMI 40.25 kg/m  Wt Readings from Last 3 Encounters:  04/18/22 213 lb (96.6 kg)  01/17/22 208 lb 6.4 oz (94.5 kg)  10/17/21 201 lb 9.6 oz (91.4 kg)    Physical Exam Vitals and nursing note reviewed.  Constitutional:      Appearance: She is well-developed.  HENT:     Head: Normocephalic and atraumatic.  Cardiovascular:     Rate and Rhythm: Normal rate and regular rhythm.     Heart sounds: Normal heart sounds. No murmur heard.    No friction rub. No gallop.  Pulmonary:     Effort: Pulmonary effort is normal. No tachypnea or respiratory distress.     Breath sounds: Normal breath sounds. No decreased breath sounds, wheezing, rhonchi or rales.  Chest:     Chest wall: No tenderness.  Abdominal:     General: Bowel sounds are normal.     Palpations: Abdomen is soft.  Musculoskeletal:        General: Normal range of motion.     Cervical back: Normal range of motion.  Skin:    General: Skin is warm and dry.  Neurological:     Mental Status: She is alert and oriented to person, place, and time.     Coordination: Coordination normal.  Psychiatric:        Behavior: Behavior normal. Behavior is cooperative.        Thought Content: Thought content normal.        Judgment: Judgment normal.           Patient has been counseled extensively about nutrition and exercise as well as the importance of adherence with medications and regular follow-up. The patient was given clear instructions to go to ER or return to medical center if symptoms don't improve, worsen or new problems develop. The patient verbalized understanding.   Follow-up: Return in 3 months (on 07/19/2022) for  HTN/LABS.   Gildardo Pounds, FNP-BC [facility] and [place], [place]   04/18/2022, 12:25 PM

## 2022-06-02 IMAGING — CT CT HEART MORP W/ CTA COR W/ SCORE W/ CA W/CM &/OR W/O CM
4 of 7 series · 8 of 20 positions shown, 9 images · non-contrast
Comparison: 12/22/2016.
COMPARISON: 12/22/2016.

Addendum:
EXAM:
OVER-READ INTERPRETATION  CT CHEST

The following report is an over-read performed by radiologist Dr.
Vries Selles [[email or web address]] on 01/02/2021. This
over-read does not include interpretation of cardiac or coronary
anatomy or pathology. The coronary calcium score/coronary CTA
interpretation by the cardiologist is attached.
CLINICAL DATA: Chest pain
Cardiac CTA
MEDICATIONS:
Sub lingual nitro. 4mg x 2
TECHNIQUE: The patient was scanned on a Siemens [REDACTED]ice scanner. Gantry
rotation speed was 250 msecs. Collimation was 0.6 mm. A 100 kV
prospective scan was triggered in the ascending thoracic aorta at
35-75% of the R-R interval. Average HR during the scan was 60 bpm.
The 3D data set was interpreted on a dedicated work station using
MPR, MIP and VRT modes. A total of 80cc of contrast was used.

[Series 6: best diast · axial · 0.39mm/px · z∈[+1188,+1229]mm · 2 of 306 slices shown, 3 images]
[im 102/306  vessel]
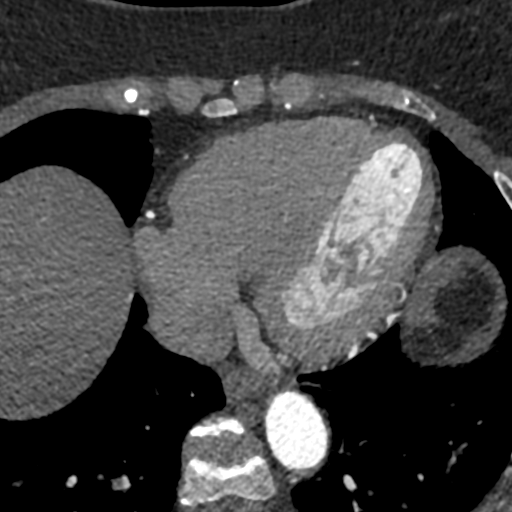
[im 102/306  lung]
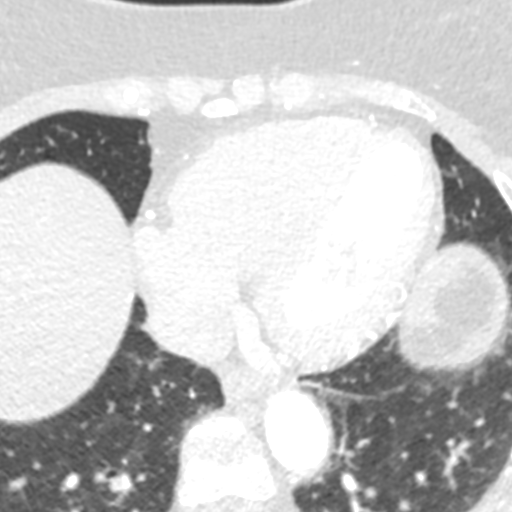
[im 204/306  vessel]
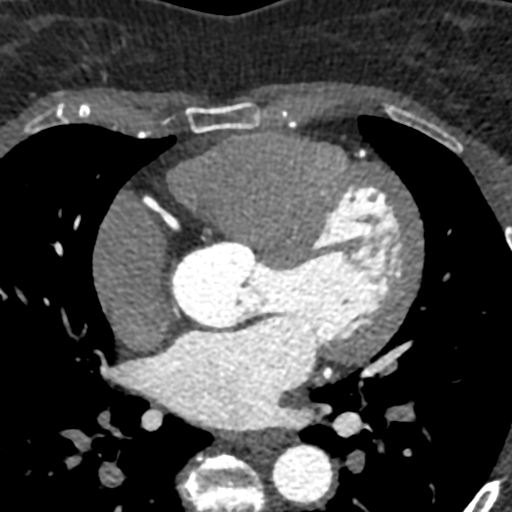

[Series 7: best syst · axial · 0.39mm/px · z∈[+1188,+1229]mm · 2 of 306 slices shown]
[im 102/306  vessel]
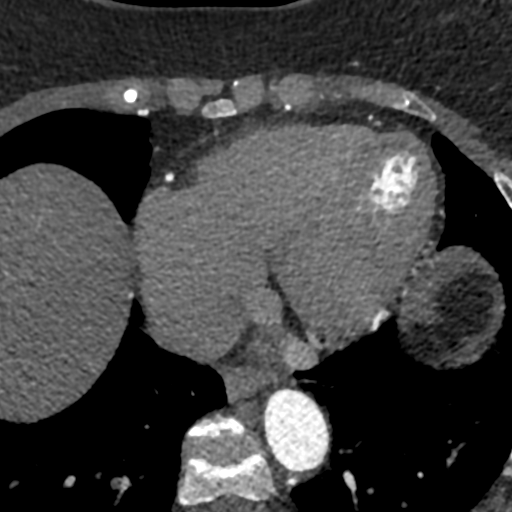
[im 204/306  vessel]
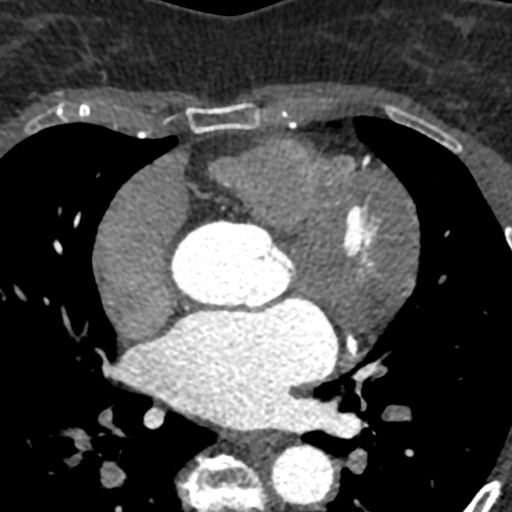

[Series 8: ts diast sharp · axial · 0.39mm/px · z∈[+1188,+1229]mm · 2 of 306 slices shown]
[im 102/306  lung]
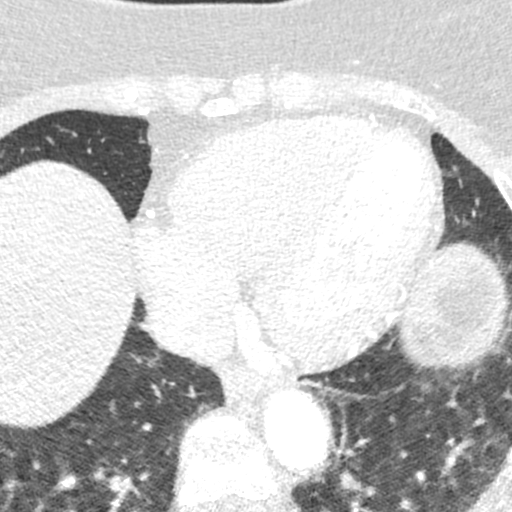
[im 204/306  lung]
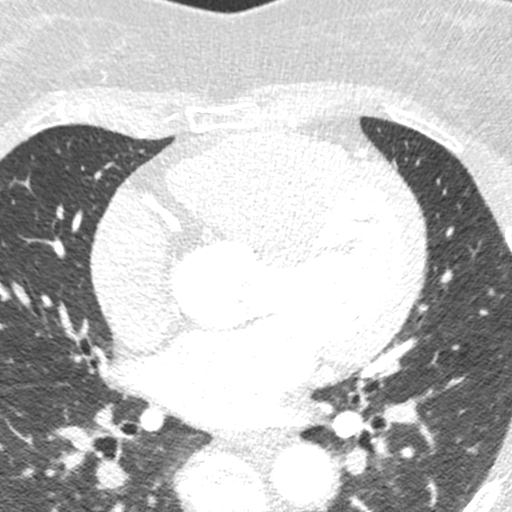

[Series 9: ts syst sharp · axial · 0.39mm/px · z∈[+1188,+1229]mm · 2 of 306 slices shown]
[im 102/306  lung]
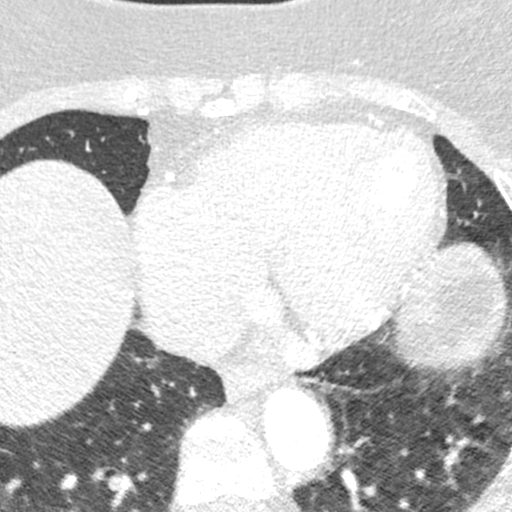
[im 204/306  lung]
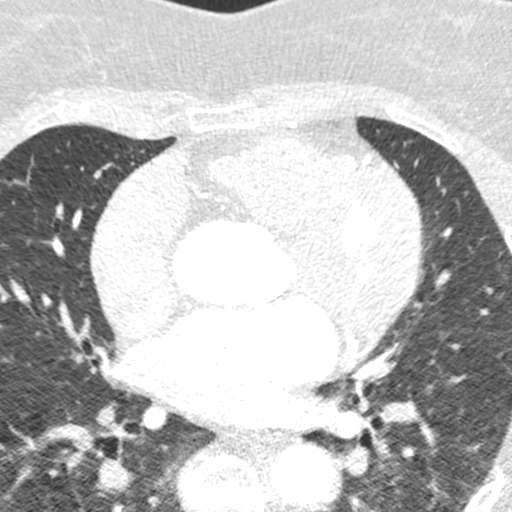

[8 of 20 positions shown; findings below may reference images not displayed]

FINDINGS: Vascular: None.

Mediastinum/Nodes: None.

Lungs/Pleura: Minimal dependent atelectasis bilaterally. No pleural
fluid.

Upper Abdomen: None.

Musculoskeletal: Degenerative changes in the spine.
IMPRESSION: No acute extracardiac findings.
FINDINGS: Non-cardiac: See separate report from [REDACTED].

Pulmonary veins drain normally to the left atrium. No LA appendage
thrombus.

Calcium Score: 7.48 Agatston units.

Coronary Arteries: Right dominant with no anomalies

LM: Short, no plaque or stenosis.

LAD system: No plaque or stenosis.

Circumflex system: Calcified plaque mid LCx with no significant
stenosis.

RCA system: No plaque or stenosis.
IMPRESSION: 1. Coronary artery calcium score 7.48 Agatston units. This places
the patient in the 74th percentile for age and gender, suggesting
intermediate risk for future cardiac events.

2.  No significant coronary stenosis.

Minda Senna

*** End of Addendum ***
EXAM:
OVER-READ INTERPRETATION  CT CHEST

The following report is an over-read performed by radiologist Dr.
Vries Selles [REDACTED] on 01/02/2021. This
over-read does not include interpretation of cardiac or coronary
anatomy or pathology. The coronary calcium score/coronary CTA
interpretation by the cardiologist is attached.
FINDINGS: Vascular: None.

Mediastinum/Nodes: None.

Lungs/Pleura: Minimal dependent atelectasis bilaterally. No pleural
fluid.

Upper Abdomen: None.

Musculoskeletal: Degenerative changes in the spine.
IMPRESSION: No acute extracardiac findings.

## 2022-06-17 ENCOUNTER — Other Ambulatory Visit: Payer: Self-pay

## 2022-06-20 ENCOUNTER — Other Ambulatory Visit: Payer: Self-pay

## 2022-07-23 ENCOUNTER — Ambulatory Visit: Payer: Self-pay | Admitting: Nurse Practitioner

## 2022-08-26 ENCOUNTER — Other Ambulatory Visit (HOSPITAL_COMMUNITY): Payer: Self-pay

## 2022-08-26 ENCOUNTER — Other Ambulatory Visit: Payer: Self-pay

## 2022-10-29 ENCOUNTER — Ambulatory Visit: Payer: Medicaid Other | Attending: Nurse Practitioner | Admitting: Nurse Practitioner

## 2022-10-29 ENCOUNTER — Encounter: Payer: Self-pay | Admitting: Nurse Practitioner

## 2022-10-29 VITALS — BP 123/84 | HR 74 | Ht 61.0 in | Wt 219.4 lb

## 2022-10-29 DIAGNOSIS — Z23 Encounter for immunization: Secondary | ICD-10-CM | POA: Diagnosis not present

## 2022-10-29 DIAGNOSIS — Z1231 Encounter for screening mammogram for malignant neoplasm of breast: Secondary | ICD-10-CM

## 2022-10-29 DIAGNOSIS — I10 Essential (primary) hypertension: Secondary | ICD-10-CM

## 2022-10-29 DIAGNOSIS — R7303 Prediabetes: Secondary | ICD-10-CM | POA: Diagnosis not present

## 2022-10-29 NOTE — Progress Notes (Signed)
Assessment & Plan:  Nina Hopkins was seen today for medical management of chronic issues.  Diagnoses and all orders for this visit:  Primary hypertension -     CMP14+EGFR  Breast cancer screening by mammogram -     MS 3D SCR MAMMO BILAT BR (aka MM); Future  Prediabetes -     Hemoglobin A1c  Encounter for immunization -     Flu vaccine trivalent PF, 6mos and older(Flulaval,Afluria,Fluarix,Fluzone)    Patient has been counseled on age-appropriate routine health concerns for screening and prevention. These are reviewed and up-to-date. Referrals have been placed accordingly. Immunizations are up-to-date or declined.    Subjective:   Chief Complaint  Patient presents with   Medical Management of Chronic Issues   HPI Nina Hopkins 62 y.o. female presents to office today for follow up to HTN  She has a past medical history of Anemia, Anxiety (~ 2008), Dyspnea,  Hyperlipidemia, Hypertension   HTN Blood pressure is well controlled. She is taking chlorthalidone 25 mg daily. BP Readings from Last 3 Encounters:  10/29/22 123/84  04/18/22 127/83  01/17/22 127/87     Prediabetes A1c at goal of <6.5 Lab Results  Component Value Date   HGBA1C 5.6 01/17/2022    Review of Systems  Constitutional:  Negative for fever, malaise/fatigue and weight loss.  HENT: Negative.  Negative for nosebleeds.   Eyes: Negative.  Negative for blurred vision, double vision and photophobia.  Respiratory: Negative.  Negative for cough and shortness of breath.   Cardiovascular: Negative.  Negative for chest pain, palpitations and leg swelling.  Gastrointestinal: Negative.  Negative for heartburn, nausea and vomiting.  Musculoskeletal: Negative.  Negative for myalgias.  Neurological: Negative.  Negative for dizziness, focal weakness, seizures and headaches.  Psychiatric/Behavioral: Negative.  Negative for suicidal ideas.     Past Medical History:  Diagnosis Date   Anemia    Anxiety ~ 2008    no meds   Dyspnea    hx - r/t anemia per patient   Family history of adverse reaction to anesthesia    "my sister had a hard time waking up"   History of blood transfusion 12/20/2014   related to anemia   Hyperlipidemia    Hypertension    Missed ab    x 1 - no surgery required   SVD (spontaneous vaginal delivery)    x 2    Past Surgical History:  Procedure Laterality Date   ABDOMINAL HYSTERECTOMY N/A 03/17/2017   Procedure: HYSTERECTOMY ABDOMINAL;  Surgeon: Allie Bossier, MD;  Location: [facility];  Service: Gynecology;  Laterality: N/A;   CERVIX LESION DESTRUCTION     cryro   CYSTOSCOPY N/A 03/17/2017   Procedure: CYSTOSCOPY;  Surgeon: Allie Bossier, MD;  Location: [facility];  Service: Gynecology;  Laterality: N/A;   DILATION AND CURETTAGE, DIAGNOSTIC / THERAPEUTIC      in office   SALPINGOOPHORECTOMY Bilateral 03/17/2017   Procedure: SALPINGO OOPHORECTOMY;  Surgeon: Allie Bossier, MD;  Location: [facility];  Service: Gynecology;  Laterality: Bilateral;   TUBAL LIGATION  1990's    Family History  Problem Relation Age of Onset   Cancer Mother        BRAIN TUMOR, VULVAR   Hypertension Mother    Diabetes Mother    Cancer Father        PENILE   Cancer Maternal Grandmother        LUNG   Cancer Maternal Grandfather  COLON   Diabetes Sister    Hypertension Sister    Heart disease Sister     Social History Reviewed with no changes to be made today.   Outpatient Medications Prior to Visit  Medication Sig Dispense Refill   chlorthalidone (HYGROTON) 25 MG tablet Take 1 tablet (25 mg total) by mouth daily. 90 tablet 1   docusate sodium (STOOL SOFTENER) 100 MG capsule Take 100 mg by mouth every other day.     Multiple Vitamin (MULTIVITAMIN WITH MINERALS) TABS tablet Take 1 tablet by mouth daily.     albuterol (VENTOLIN HFA) 108 (90 Base) MCG/ACT inhaler Inhale 1-2 puffs into the lungs every 6 (six) hours as needed for wheezing or shortness of breath. (Patient not taking: Reported on  10/29/2022) 8.5 g 0   Omega-3 Fatty Acids (FISH OIL) 1000 MG CAPS Take 1 capsule by mouth daily. (Patient not taking: Reported on 10/29/2022)     No facility-administered medications prior to visit.    Allergies  Allergen Reactions   Prilosec [Omeprazole]     Stiff neck       Objective:    BP 123/84 (BP Location: Left Arm, Patient Position: Sitting, Cuff Size: Normal)   Pulse 74   Ht 5\' 1"  (1.549 m)   Wt 219 lb 6.4 oz (99.5 kg)   LMP 03/02/2017 (Approximate)   SpO2 98%   BMI 41.46 kg/m  Wt Readings from Last 3 Encounters:  10/29/22 219 lb 6.4 oz (99.5 kg)  04/18/22 213 lb (96.6 kg)  01/17/22 208 lb 6.4 oz (94.5 kg)    Physical Exam Vitals and nursing note reviewed.  Constitutional:      Appearance: She is well-developed.  HENT:     Head: Normocephalic and atraumatic.  Cardiovascular:     Rate and Rhythm: Normal rate and regular rhythm.     Heart sounds: Normal heart sounds. No murmur heard.    No friction rub. No gallop.  Pulmonary:     Effort: Pulmonary effort is normal. No tachypnea or respiratory distress.     Breath sounds: Normal breath sounds. No decreased breath sounds, wheezing, rhonchi or rales.  Chest:     Chest wall: No tenderness.  Abdominal:     General: Bowel sounds are normal.     Palpations: Abdomen is soft.  Musculoskeletal:        General: Normal range of motion.     Cervical back: Normal range of motion.  Skin:    General: Skin is warm and dry.  Neurological:     Mental Status: She is alert and oriented to person, place, and time.     Coordination: Coordination normal.  Psychiatric:        Behavior: Behavior normal. Behavior is cooperative.        Thought Content: Thought content normal.        Judgment: Judgment normal.          Patient has been counseled extensively about nutrition and exercise as well as the importance of adherence with medications and regular follow-up. The patient was given clear instructions to go to ER or return  to medical center if symptoms don't improve, worsen or new problems develop. The patient verbalized understanding.   Follow-up: Return in about 6 months (around 04/28/2023).   Claiborne Rigg, FNP-BC [facility] and [facility] [place], [place] [phone]   10/29/2022, 10:48 PM

## 2022-10-30 LAB — CMP14+EGFR
ALT: 18 IU/L (ref 0–32)
AST: 21 IU/L (ref 0–40)
Albumin: 4.3 g/dL (ref 3.8–4.9)
Alkaline Phosphatase: 83 IU/L (ref 44–121)
BUN/Creatinine Ratio: 17 (ref 12–28)
BUN: 13 mg/dL (ref 8–27)
Bilirubin Total: 0.2 mg/dL (ref 0.0–1.2)
CO2: 26 mmol/L (ref 20–29)
Calcium: 9.2 mg/dL (ref 8.7–10.3)
Chloride: 102 mmol/L (ref 96–106)
Creatinine, Ser: 0.75 mg/dL (ref 0.57–1.00)
Globulin, Total: 2.8 g/dL (ref 1.5–4.5)
Glucose: 112 mg/dL — ABNORMAL HIGH (ref 70–99)
Potassium: 3.3 mmol/L — ABNORMAL LOW (ref 3.5–5.2)
Sodium: 143 mmol/L (ref 134–144)
Total Protein: 7.1 g/dL (ref 6.0–8.5)
eGFR: 91 mL/min/{1.73_m2} (ref >59)

## 2022-10-30 LAB — HEMOGLOBIN A1C
Est. average glucose Bld gHb Est-mCnc: 137 mg/dL
Hgb A1c MFr Bld: 6.4 % — ABNORMAL HIGH (ref 4.8–5.6)

## 2022-11-27 ENCOUNTER — Emergency Department (HOSPITAL_COMMUNITY): Payer: Medicaid Other

## 2022-11-27 ENCOUNTER — Emergency Department (HOSPITAL_COMMUNITY)
Admission: EM | Admit: 2022-11-27 | Discharge: 2022-11-27 | Disposition: A | Discharge status: Home / Self Care | Payer: Medicaid Other | Attending: Emergency Medicine | Admitting: Emergency Medicine

## 2022-11-27 ENCOUNTER — Ambulatory Visit: Payer: Self-pay

## 2022-11-27 ENCOUNTER — Other Ambulatory Visit: Payer: Self-pay | Admitting: Nurse Practitioner

## 2022-11-27 ENCOUNTER — Other Ambulatory Visit: Payer: Self-pay

## 2022-11-27 DIAGNOSIS — I10 Essential (primary) hypertension: Secondary | ICD-10-CM | POA: Diagnosis not present

## 2022-11-27 DIAGNOSIS — R509 Fever, unspecified: Secondary | ICD-10-CM | POA: Insufficient documentation

## 2022-11-27 DIAGNOSIS — Z20822 Contact with and (suspected) exposure to covid-19: Secondary | ICD-10-CM | POA: Insufficient documentation

## 2022-11-27 DIAGNOSIS — J029 Acute pharyngitis, unspecified: Secondary | ICD-10-CM | POA: Diagnosis not present

## 2022-11-27 DIAGNOSIS — E876 Hypokalemia: Secondary | ICD-10-CM | POA: Insufficient documentation

## 2022-11-27 DIAGNOSIS — R059 Cough, unspecified: Secondary | ICD-10-CM | POA: Diagnosis not present

## 2022-11-27 DIAGNOSIS — R0981 Nasal congestion: Secondary | ICD-10-CM | POA: Diagnosis not present

## 2022-11-27 DIAGNOSIS — J069 Acute upper respiratory infection, unspecified: Secondary | ICD-10-CM

## 2022-11-27 DIAGNOSIS — R062 Wheezing: Secondary | ICD-10-CM

## 2022-11-27 DIAGNOSIS — R072 Precordial pain: Secondary | ICD-10-CM | POA: Insufficient documentation

## 2022-11-27 DIAGNOSIS — R0789 Other chest pain: Secondary | ICD-10-CM | POA: Diagnosis not present

## 2022-11-27 LAB — CBC WITH DIFFERENTIAL/PLATELET
Abs Immature Granulocytes: 0.01 10*3/uL (ref 0.00–0.07)
Basophils Absolute: 0.1 10*3/uL (ref 0.0–0.1)
Basophils Relative: 1 %
Eosinophils Absolute: 0.2 10*3/uL (ref 0.0–0.5)
Eosinophils Relative: 3 %
HCT: 37.4 % (ref 36.0–46.0)
Hemoglobin: 12.4 g/dL (ref 12.0–15.0)
Immature Granulocytes: 0 %
Lymphocytes Relative: 34 %
Lymphs Abs: 1.6 10*3/uL (ref 0.7–4.0)
MCH: 28.8 pg (ref 26.0–34.0)
MCHC: 33.2 g/dL (ref 30.0–36.0)
MCV: 86.8 fL (ref 80.0–100.0)
Monocytes Absolute: 0.4 10*3/uL (ref 0.1–1.0)
Monocytes Relative: 9 %
Neutro Abs: 2.5 10*3/uL (ref 1.7–7.7)
Neutrophils Relative %: 53 %
Platelets: 228 10*3/uL (ref 150–400)
RBC: 4.31 MIL/uL (ref 3.87–5.11)
RDW: 12.2 % (ref 11.5–15.5)
WBC: 4.7 10*3/uL (ref 4.0–10.5)
nRBC: 0 % (ref 0.0–0.2)

## 2022-11-27 LAB — RESP PANEL BY RT-PCR (RSV, FLU A&B, COVID)  RVPGX2
Influenza A by PCR: NEGATIVE
Influenza B by PCR: NEGATIVE
Resp Syncytial Virus by PCR: NEGATIVE
SARS Coronavirus 2 by RT PCR: NEGATIVE

## 2022-11-27 LAB — BASIC METABOLIC PANEL
Anion gap: 8 (ref 5–15)
BUN: 10 mg/dL (ref 6–20)
CO2: 29 mmol/L (ref 22–32)
Calcium: 9 mg/dL (ref 8.9–10.3)
Chloride: 101 mmol/L (ref 98–111)
Creatinine, Ser: 0.9 mg/dL (ref 0.44–1.00)
GFR, Estimated: >60 mL/min (ref >60)
Glucose, Bld: 98 mg/dL (ref 70–99)
Potassium: 2.8 mmol/L — ABNORMAL LOW (ref 3.5–5.1)
Sodium: 138 mmol/L (ref 135–145)

## 2022-11-27 LAB — TROPONIN I (HIGH SENSITIVITY)
Troponin I (High Sensitivity): 6 ng/L (ref <18)
Troponin I (High Sensitivity): 7 ng/L (ref <18)

## 2022-11-27 LAB — D-DIMER, QUANTITATIVE: D-Dimer, Quant: 0.53 ug{FEU}/mL — ABNORMAL HIGH (ref 0.00–0.50)

## 2022-11-27 LAB — GROUP A STREP BY PCR: Group A Strep by PCR: NOT DETECTED

## 2022-11-27 MED ORDER — TAMSULOSIN HCL 0.4 MG PO CAPS
0.4000 mg | ORAL_CAPSULE | Freq: Every day | ORAL | 0 refills | Status: DC
  Filled 2022-11-27: qty 30, 30d supply, fill #0

## 2022-11-27 MED ORDER — POTASSIUM CHLORIDE CRYS ER 20 MEQ PO TBCR
40.0000 meq | EXTENDED_RELEASE_TABLET | Freq: Every day | ORAL | 0 refills | Status: DC
  Filled 2022-11-27: qty 10, 5d supply, fill #0

## 2022-11-27 MED ORDER — IOHEXOL 350 MG/ML SOLN
75.0000 mL | Freq: Once | INTRAVENOUS | Status: AC | PRN
  Administered 2022-11-27: 75 mL via INTRAVENOUS

## 2022-11-27 MED ORDER — ACETAMINOPHEN 500 MG PO TABS
1000.0000 mg | ORAL_TABLET | Freq: Once | ORAL | Status: AC
  Administered 2022-11-27: 1000 mg via ORAL
  Filled 2022-11-27: qty 2

## 2022-11-27 MED ORDER — METHOCARBAMOL 500 MG PO TABS
500.0000 mg | ORAL_TABLET | Freq: Three times a day (TID) | ORAL | 0 refills | Status: DC | PRN
  Filled 2022-11-27: qty 20, 7d supply, fill #0

## 2022-11-27 NOTE — ED Provider Notes (Signed)
Emergency Department Provider Note   I have reviewed the triage vital signs and the nursing notes.   HISTORY  Chief Complaint Sore Throat and Shortness of Breath   HPI Nina Hopkins is a 62 y.o. female with PMH reviewed below presents to the emergency department with sore throat, cough, congestion.  Symptoms been worsening over the past week.  She describes some subjective fevers.  Some tightness in her chest, worse with deep breathing.  She reports a remote history of DVT, 20+ years ago, thought to be provoked by birth control which she has discontinued.  She is not currently anticoagulated.  No active pain at this time. Family has been sick with URI symptoms.    Past Medical History:  Diagnosis Date   Anemia    Anxiety ~ 2008   no meds   Dyspnea    hx - r/t anemia per patient   Family history of adverse reaction to anesthesia    "my sister had a hard time waking up"   History of blood transfusion 12/20/2014   related to anemia   Hyperlipidemia    Hypertension    Missed ab    x 1 - no surgery required   SVD (spontaneous vaginal delivery)    x 2    Review of Systems  Constitutional: No fever/chills Cardiovascular: Positive CP Respiratory: Positive shortness of breath and cough.  Gastrointestinal: No abdominal pain.  No nausea, no vomiting.  Musculoskeletal: Negative for back pain. Skin: Negative for rash. Neurological: Negative for headaches, focal weakness or numbness.  ____________________________________________   PHYSICAL EXAM:  VITAL SIGNS: ED Triage Vitals  Encounter Vitals Group     BP 11/27/22 1357 (!) 139/95     Pulse Rate 11/27/22 1357 (!) 113     Resp 11/27/22 1357 16     Temp 11/27/22 1357 99.1 F (37.3 C)     Temp Source 11/27/22 1733 Oral     SpO2 11/27/22 1357 99 %     Weight 11/27/22 1404 219 lb (99.3 kg)     Height 11/27/22 1404 5\' 1"  (1.549 m)    Constitutional: Alert and oriented. Well appearing and in no acute  distress. Eyes: Conjunctivae are normal.  Head: Atraumatic. Nose: No congestion/rhinnorhea. Mouth/Throat: Mucous membranes are moist.  Neck: No stridor.   Cardiovascular: Normal rate, regular rhythm. Good peripheral circulation. Grossly normal heart sounds.   Respiratory: Normal respiratory effort.  No retractions. Lungs CTAB. Gastrointestinal: Soft and nontender. No distention.  Musculoskeletal: No gross deformities of extremities. Neurologic:  Normal speech and language. No gross focal neurologic deficits are appreciated.  Skin:  Skin is warm, dry and intact. No rash noted.  ____________________________________________   LABS (all labs ordered are listed, but only abnormal results are displayed)  Labs Reviewed  BASIC METABOLIC PANEL - Abnormal; Notable for the following components:      Result Value   Potassium 2.8 (*)    All other components within normal limits  D-DIMER, QUANTITATIVE - Abnormal; Notable for the following components:   D-Dimer, Quant 0.53 (*)    All other components within normal limits  RESP PANEL BY RT-PCR (RSV, FLU A&B, COVID)  [id number]  GROUP A STREP BY PCR  CBC WITH DIFFERENTIAL/PLATELET  TROPONIN I (HIGH SENSITIVITY)  TROPONIN I (HIGH SENSITIVITY)   ____________________________________________  EKG   EKG Interpretation Date/Time:  Thursday November 27 2022 20:50:28 EDT Ventricular Rate:  76 PR Interval:  186 QRS Duration:  92 QT Interval:  416 QTC Calculation:  468 R Axis:   0  Text Interpretation: Normal sinus rhythm Septal infarct , age undetermined Abnormal ECG When compared with ECG of 14-May-2017 09:46, PREVIOUS ECG IS PRESENT Confirmed by Alona Bene [phone]) on 11/27/2022 8:54:06 PM       ____________________________________________   PROCEDURES  Procedure(s) performed:   Procedures  None ____________________________________________   INITIAL IMPRESSION / ASSESSMENT AND PLAN / ED COURSE  Pertinent labs & imaging results  that were available during my care of the patient were reviewed by me and considered in my medical decision making (see chart for details).   This patient is Presenting for Evaluation of CP, which does require a range of treatment options, and is a complaint that involves a high risk of morbidity and mortality.  The Differential Diagnoses includes but is not exclusive to acute coronary syndrome, aortic dissection, pulmonary embolism, cardiac tamponade, community-acquired pneumonia, pericarditis, musculoskeletal chest wall pain, etc.   Critical Interventions-    Medications  acetaminophen (TYLENOL) tablet 1,000 mg (1,000 mg Oral Given 11/27/22 1429)  iohexol (OMNIPAQUE) 350 MG/ML injection 75 mL (75 mLs Intravenous Contrast Given 11/27/22 2046)    Reassessment after intervention: pain improved.   Clinical Laboratory Tests Ordered, included troponin negative. D dimer elevated. COVID negative. No AKI. Strep negative.   Radiologic Tests Ordered, included CTA chest and CXR. I independently interpreted the images and agree with radiology interpretation.   Cardiac Monitor Tracing which shows NSR.    Social Determinants of Health Risk patient is a non-smoker.   Medical Decision Making: Summary:  Presents emergency department bronchitis symptoms but also fairly atypical chest pain and shortness of breath.  Viral panel and strep test are negative.  D-dimer slightly elevated prompting CTA of the chest which shows no pneumonia or PE.  Reevaluation with update and discussion with patient. Plan for supportive care at home with strict ED return precautions.   Patient's presentation is most consistent with acute presentation with potential threat to life or bodily function.   Disposition: discharge  ____________________________________________  FINAL CLINICAL IMPRESSION(S) / ED DIAGNOSES  Final diagnoses:  Precordial chest pain  Hypokalemia  Viral pharyngitis     NEW OUTPATIENT  MEDICATIONS STARTED DURING THIS VISIT:  Discharge Medication List as of 11/27/2022 10:44 PM     START taking these medications   Details  methocarbamol (ROBAXIN) 500 MG tablet Take 1 tablet (500 mg total) by mouth every 8 (eight) hours as needed for muscle spasms., Starting Thu 11/27/2022, Normal    potassium chloride SA (KLOR-CON M) 20 MEQ tablet Take 2 tablets (40 mEq total) by mouth daily for 5 days., Starting Thu 11/27/2022, Until Tue 12/02/2022, Normal    tamsulosin (FLOMAX) 0.4 MG CAPS capsule Take 1 capsule (0.4 mg total) by mouth daily., Starting Thu 11/27/2022, Normal        Note:  This document was prepared using Dragon voice recognition software and may include unintentional dictation errors.  Alona Bene, MD, [facility] Emergency Medicine    Tylicia Sherman, Arlyss Repress, MD 11/29/22 Jacinta Shoe

## 2022-11-27 NOTE — Discharge Instructions (Addendum)
You were seen in the ED today with chest pain. Your labs and CT scans are reassuring. I suspect you have a viral illness causing your pain. Your potassium is low and I am supplementing you for the next 5 days with potassium. Please contact your PCP in the next week for follow up and repeat lab work at that time.   Return to the ED with any new or suddenly worsening symptoms.

## 2022-11-27 NOTE — ED Notes (Signed)
Pt given a turkey sandwich and ginger ale 

## 2022-11-27 NOTE — Telephone Encounter (Signed)
Medication Refill - Medication: albuterol (PROVENTIL HFA;VENTOLIN HFA) 108 (90 BASE) MCG/ACT inhaler 2 puff chlorthalidone (HYGROTON) 25 MG tablet [[id number]]   Has the patient contacted their pharmacy? No.  (Agent: If no, request that the patient contact the pharmacy for the refill. If patient does not wish to contact the pharmacy document the reason why and proceed with request.) No refills   Preferred Pharmacy (with phone number or street name): [facility]   Has the patient been seen for an appointment in the last year OR does the patient have an upcoming appointment? Yes.    Agent: Please be advised that RX refills may take up to 3 business days. We ask that you follow-up with your pharmacy.

## 2022-11-27 NOTE — ED Triage Notes (Signed)
Pt has been feeling "sick" since last Thursday. Generalized body aches and sore throat. Coughing sneezing and has headache.

## 2022-11-27 NOTE — ED Notes (Signed)
Patient transported to CT 

## 2022-11-27 NOTE — Telephone Encounter (Signed)
Chief Complaint: SOB Symptoms: sore throat, feels sick in the stomach, pain in chest through to shoulder blades when taking deeper breaths (no current CP at this time), headache Frequency: Onset of SOB since yesterday Pertinent Negatives: Patient denies other symptoms Disposition: [x] ED /[] Urgent Care (no appt availability in office) / [] Appointment(In office/virtual)/ []  [facility] [facility]/ [] Home Care/ [] Refused Recommended Disposition /[] [place] [facility]/ []  Follow-up with PCP Additional Notes: Advised ED for SOB, could hear patient was breathing harder, she says she was walking in the house in the kitchen. She says she's tested for COVID twice and both negative. She says she will get an Benedetto Goad to take her to the ED.     Pt is calling in because she has cold symptoms and would like a nurse to call her back. Pt says she has a sore throat, headache, and her torso feels "yucky". She says it has been like this for a week.   Reason for Disposition  [1] MODERATE difficulty breathing (e.g., speaks in phrases, SOB even at rest, pulse 100-120) AND [2] NEW-onset or WORSE than normal  Answer Assessment - Initial Assessment Questions 1. RESPIRATORY STATUS: "Describe your breathing?" (e.g., wheezing, shortness of breath, unable to speak, severe coughing)      SOB 2. ONSET: "When did this breathing problem begin?"      Since yesterday 3. PATTERN "Does the difficult breathing come and go, or has it been constant since it started?"      Maybe more with movement 4. SEVERITY: "How bad is your breathing?" (e.g., mild, moderate, severe)    - MILD: No SOB at rest, mild SOB with walking, speaks normally in sentences, can lie down, no retractions, pulse < 100.    - MODERATE: SOB at rest, SOB with minimal exertion and prefers to sit, cannot lie down flat, speaks in phrases, mild retractions, audible wheezing, pulse 100-120.    - SEVERE: Very SOB at rest, speaks in single words, struggling to breathe,  sitting hunched forward, retractions, pulse > 120      Moderate 5. RECURRENT SYMPTOM: "Have you had difficulty breathing before?" If Yes, ask: "When was the last time?" and "What happened that time?"      Yes, bronchitis and had to use an inhaler 6. CARDIAC HISTORY: "Do you have any history of heart disease?" (e.g., heart attack, angina, bypass surgery, angioplasty)      No 7. LUNG HISTORY: "Do you have any history of lung disease?"  (e.g., pulmonary embolus, asthma, emphysema)     No 8. CAUSE: "What do you think is causing the breathing problem?"      Whatever this illness is because don't normally have breathing problems 9. OTHER SYMPTOMS: "Do you have any other symptoms? (e.g., dizziness, runny nose, cough, chest pain, fever)     Pain in chest when deep breathing through the back between should blades, sore throat, headache, nasal congestion, cough  Protocols used: Breathing Difficulty-A-AH

## 2022-11-27 NOTE — ED Provider Triage Note (Signed)
Emergency Medicine Provider Triage Evaluation Note  Londynn Kamauri Gilmore , a 62 y.o. female  was evaluated in triage.  Pt complains of cold sxs. 9 days of congestion, sore throat, cough and decreased in appetite.  No headache, cp, abd pain, dysuria. No sob  Review of Systems  Positive: As above Negative: As above  Physical Exam  BP (!) 139/95 (BP Location: Right Arm)   Pulse (!) 113   Temp 99.1 F (37.3 C)   Resp 16   Ht 5\' 1"  (1.549 m)   Wt 99.3 kg   LMP 03/02/2017 (Approximate)   SpO2 99%   BMI 41.38 kg/m  Gen:   Awake, no distress   Resp:  Normal effort  MSK:   Moves extremities without difficulty  Other:    Medical Decision Making  Medically screening exam initiated at 2:17 PM.  Appropriate orders placed.  Enya Renee Neville was informed that the remainder of the evaluation will be completed by another provider, this initial triage assessment does not replace that evaluation, and the importance of remaining in the ED until their evaluation is complete.     Fayrene Helper, PA-C 11/27/22 1418

## 2022-11-28 ENCOUNTER — Other Ambulatory Visit: Payer: Self-pay

## 2022-11-28 ENCOUNTER — Other Ambulatory Visit (HOSPITAL_COMMUNITY): Payer: Self-pay

## 2022-11-28 MED ORDER — CHLORTHALIDONE 25 MG PO TABS
25.0000 mg | ORAL_TABLET | Freq: Every day | ORAL | 0 refills | Status: DC
  Filled 2022-12-11 – 2023-01-01 (×2): qty 90, 90d supply, fill #0

## 2022-11-28 MED ORDER — ALBUTEROL SULFATE HFA 108 (90 BASE) MCG/ACT IN AERS
1.0000 | INHALATION_SPRAY | Freq: Four times a day (QID) | RESPIRATORY_TRACT | 0 refills | Status: DC | PRN
  Filled 2022-11-28: qty 8.5, 25d supply, fill #0

## 2022-11-28 NOTE — Telephone Encounter (Signed)
Requested medication (s) are due for refill today: routing for review  Requested medication (s) are on the active medication list: yes  Last refill:  06/06/21  Future visit scheduled: yes  Notes to clinic:  Unable to refill per protocol, Rx expired. Last refilled by another provider, routing for approval.      Requested Prescriptions  Pending Prescriptions Disp Refills   albuterol (VENTOLIN HFA) 108 (90 Base) MCG/ACT inhaler 8.5 g 0    Sig: Inhale 1-2 puffs into the lungs every 6 (six) hours as needed for wheezing or shortness of breath.     Pulmonology:  Beta Agonists 2 Passed - 11/27/2022 11:38 AM      Passed - Last BP in normal range    BP Readings from Last 1 Encounters:  11/27/22 133/88         Passed - Last Heart Rate in normal range    Pulse Readings from Last 1 Encounters:  11/27/22 80         Passed - Valid encounter within last 12 months    Recent Outpatient Visits           1 month ago Primary hypertension   [place] [facility] [place], Shea Stakes, NP   7 months ago Primary hypertension   [place] [facility] [place], Shea Stakes, NP   10 months ago Primary hypertension   [place] [facility] [place], Shea Stakes, NP   1 year ago Primary hypertension   [place] [facility] [place], Shea Stakes, NP   1 year ago Primary hypertension   [place] [facility] Claiborne Rigg, NP       Future Appointments             In 5 months Claiborne Rigg, NP [facility]            Signed Prescriptions Disp Refills   chlorthalidone (HYGROTON) 25 MG tablet 90 tablet 0    Sig: Take 1 tablet (25 mg total) by mouth daily.     Cardiovascular: Diuretics - Thiazide Failed - 11/27/2022 11:38 AM      Failed - K in normal range and within 180 days    Potassium  Date Value Ref Range Status  11/27/2022 2.8 (L) 3.5 - 5.1  mmol/L Final         Passed - Cr in normal range and within 180 days    Creat  Date Value Ref Range Status  01/08/2015 0.74 0.50 - 1.05 mg/dL Final   Creatinine, Ser  Date Value Ref Range Status  11/27/2022 0.90 0.44 - 1.00 mg/dL Final         Passed - Na in normal range and within 180 days    Sodium  Date Value Ref Range Status  11/27/2022 138 135 - 145 mmol/L Final  10/29/2022 143 134 - 144 mmol/L Final         Passed - Last BP in normal range    BP Readings from Last 1 Encounters:  11/27/22 133/88         Passed - Valid encounter within last 6 months    Recent Outpatient Visits           1 month ago Primary hypertension   [place] [facility] [place], Shea Stakes, NP   7 months ago Primary hypertension   [facility] [place], Shea Stakes, NP   10 months ago Primary hypertension   [place] [facility] [place], Shea Stakes, NP   1 year ago Primary hypertension   [facility] [place], Shea Stakes, NP   1 year ago Primary hypertension   [place] [facility] Claiborne Rigg, NP       Future Appointments             In 5 months Claiborne Rigg, NP [facility]

## 2022-11-28 NOTE — Telephone Encounter (Signed)
Requested Prescriptions  Pending Prescriptions Disp Refills   chlorthalidone (HYGROTON) 25 MG tablet 90 tablet 0    Sig: Take 1 tablet (25 mg total) by mouth daily.     Cardiovascular: Diuretics - Thiazide Failed - 11/27/2022 11:38 AM      Failed - K in normal range and within 180 days    Potassium  Date Value Ref Range Status  11/27/2022 2.8 (L) 3.5 - 5.1 mmol/L Final         Passed - Cr in normal range and within 180 days    Creat  Date Value Ref Range Status  01/08/2015 0.74 0.50 - 1.05 mg/dL Final   Creatinine, Ser  Date Value Ref Range Status  11/27/2022 0.90 0.44 - 1.00 mg/dL Final         Passed - Na in normal range and within 180 days    Sodium  Date Value Ref Range Status  11/27/2022 138 135 - 145 mmol/L Final  10/29/2022 143 134 - 144 mmol/L Final         Passed - Last BP in normal range    BP Readings from Last 1 Encounters:  11/27/22 133/88         Passed - Valid encounter within last 6 months    Recent Outpatient Visits           1 month ago Primary hypertension   [place] [facility] [place], Shea Stakes, NP   7 months ago Primary hypertension   [place] [facility] [place], Shea Stakes, NP   10 months ago Primary hypertension   [place] [facility] [place], Shea Stakes, NP   1 year ago Primary hypertension   [place] [facility] [place], Shea Stakes, NP   1 year ago Primary hypertension   [place] [facility] [place], Shea Stakes, NP       Future Appointments             In 5 months Claiborne Rigg, NP [facility]             albuterol (VENTOLIN HFA) 108 (90 Base) MCG/ACT inhaler 8.5 g 0    Sig: Inhale 1-2 puffs into the lungs every 6 (six) hours as needed for wheezing or shortness of breath.     Pulmonology:  Beta Agonists 2 Passed - 11/27/2022 11:38 AM      Passed - Last BP in normal range     BP Readings from Last 1 Encounters:  11/27/22 133/88         Passed - Last Heart Rate in normal range    Pulse Readings from Last 1 Encounters:  11/27/22 80         Passed - Valid encounter within last 12 months    Recent Outpatient Visits           1 month ago Primary hypertension   [place] [facility] [place], Shea Stakes, NP   7 months ago Primary hypertension   [place] [facility] [place], Shea Stakes, NP   10 months ago Primary hypertension   [place] [facility] [place], Shea Stakes, NP   1 year ago Primary hypertension   [place] [facility] [place], Shea Stakes, NP   1 year ago Primary hypertension   [facility] [place], Shea Stakes, NP       Future Appointments             In 5 months Claiborne Rigg, NP [facility]

## 2022-12-01 ENCOUNTER — Other Ambulatory Visit (HOSPITAL_COMMUNITY): Payer: Self-pay

## 2022-12-02 ENCOUNTER — Other Ambulatory Visit: Payer: Self-pay

## 2022-12-03 ENCOUNTER — Ambulatory Visit: Payer: Self-pay

## 2022-12-03 ENCOUNTER — Ambulatory Visit: Payer: Self-pay | Admitting: *Deleted

## 2022-12-03 ENCOUNTER — Ambulatory Visit
Admission: RE | Admit: 2022-12-03 | Discharge: 2022-12-03 | Disposition: A | Discharge status: Home / Self Care | Payer: Medicaid Other | Source: Ambulatory Visit | Attending: Emergency Medicine | Admitting: Emergency Medicine

## 2022-12-03 VITALS — BP 137/87 | HR 89 | Temp 98.9°F | Resp 18

## 2022-12-03 DIAGNOSIS — J069 Acute upper respiratory infection, unspecified: Secondary | ICD-10-CM

## 2022-12-03 LAB — POCT URINALYSIS DIP (MANUAL ENTRY)
Bilirubin, UA: NEGATIVE
Blood, UA: NEGATIVE
Glucose, UA: NEGATIVE mg/dL
Ketones, POC UA: NEGATIVE mg/dL
Leukocytes, UA: NEGATIVE
Nitrite, UA: NEGATIVE
Protein Ur, POC: NEGATIVE mg/dL
Spec Grav, UA: 1.02 (ref 1.010–1.025)
Urobilinogen, UA: 1 U/dL
pH, UA: 8.5 — AB (ref 5.0–8.0)

## 2022-12-03 MED ORDER — AMOXICILLIN-POT CLAVULANATE 875-125 MG PO TABS: 1.0000 | ORAL_TABLET | Freq: Two times a day (BID) | ORAL | 0 refills | Status: AC

## 2022-12-03 MED ORDER — BENZONATATE 100 MG PO CAPS: 100.0000 mg | ORAL_CAPSULE | Freq: Three times a day (TID) | ORAL | 0 refills | Status: DC

## 2022-12-03 MED ORDER — PREDNISONE 20 MG PO TABS: 40.0000 mg | ORAL_TABLET | Freq: Every day | ORAL | 0 refills | Status: DC

## 2022-12-03 NOTE — Telephone Encounter (Signed)
  Chief Complaint: Patient is concerned about medication interaction Symptoms: URI  Disposition: [] ED /[] Urgent Care (no appt availability in office) / [] Appointment(In office/virtual)/ []  [place] [facility]/ [] Home Care/ [] Refused Recommended Disposition /[] [place] [facility]/ [x]  Follow-up with PCP Additional Notes: Patient was seen at [facility] today and paperwork she was given advised her to check with PCP for medication interaction. Patient is calling to ask PCP to review medications before she starts- see medication list.

## 2022-12-03 NOTE — Telephone Encounter (Signed)
  Chief Complaint: left flank pain and SOB Symptoms: mod pain mild SOB, runny nose cough- not urinating as much as usual Frequency: Yest pain Pertinent Negatives: Patient denies chest pain, fever Disposition: [] ED /[x] Urgent Care (no appt availability in office) / [] Appointment(In office/virtual)/ []  [place] [facility]/ [] Home Care/ [] Refused Recommended Disposition /[] [place] [facility]/ []  Follow-up with PCP Additional Notes: pt taking Uber to UC in [place]- no appts in office Reason for Disposition  MODERATE pain (e.g., interferes with normal activities or awakens from sleep)  Answer Assessment - Initial Assessment Questions 1. RESPIRATORY STATUS: "Describe your breathing?" (e.g., wheezing, shortness of breath, unable to speak, severe coughing)      SOB 2. ONSET: "When did this breathing problem begin?"      Thursday 3. PATTERN "Does the difficult breathing come and go, or has it been constant since it started?"      Comes and goes worse with exertion 4. SEVERITY: "How bad is your breathing?" (e.g., mild, moderate, severe)    - MILD: No SOB at rest, mild SOB with walking, speaks normally in sentences, can lie down, no retractions, pulse < 100.    - MODERATE: SOB at rest, SOB with minimal exertion and prefers to sit, cannot lie down flat, speaks in phrases, mild retractions, audible wheezing, pulse 100-120.    - SEVERE: Very SOB at rest, speaks in single words, struggling to breathe, sitting hunched forward, retractions, pulse > 120      mild 5. RECURRENT SYMPTOM: "Have you had difficulty breathing before?" If Yes, ask: "When was the last time?" and "What happened that time?"      last 9. OTHER SYMPTOMS: "Do you have any other symptoms? (e.g., dizziness, runny nose, cough, chest pain, fever)     Runny nose, cough, fever, stuffy nose  Answer Assessment - Initial Assessment Questions 1. LOCATION: "Where does it hurt?" (e.g., left, right)     Left side 2. ONSET: "When did the pain  start?"     Yesterday  3. SEVERITY: "How bad is the pain?" (e.g., Scale 1-10; mild, moderate, or severe)   - MILD (1-3): doesn't interfere with normal activities    - MODERATE (4-7): interferes with normal activities or awakens from sleep    - SEVERE (8-10): excruciating pain and patient unable to do normal activities (stays in bed)       mod 4. PATTERN: "Does the pain come and go, or is it constant?"      constant 5. CAUSE: "What do you think is causing the pain?"     unknown 6. OTHER SYMPTOMS:  "Do you have any other symptoms?" (e.g., fever, abdomen pain, vomiting, leg weakness, burning with urination, blood in urine)     Not urinating as much as normal  7. PREGNANCY:  "Is there any chance you are pregnant?" "When was your last menstrual period?"     N/a  Protocols used: Breathing Difficulty-A-AH, Flank Pain-A-AH

## 2022-12-03 NOTE — Telephone Encounter (Signed)
noted 

## 2022-12-03 NOTE — Discharge Instructions (Addendum)
As your symptoms have been present for 2 weeks most likely bacteria contributing to your upper airways causing symptoms to prolong  Exam the side pain appears to be muscular, should improve with use of steroids  Urinalysis is negative for bladder infection  Begin Augmentin every morning and every evening for 10 days, will take 48 hours to fully get in your system but she sees small improvement day by day  Begin prednisone every morning with food for 5 days to open and relax airway should help settle shortness of breath and harshness of cough  You may use Tessalon pill every 8 hours as needed to help calm coughing, may continue use of NyQuil in addition to this  Continue use of albuterol inhaler as directed    You can take Tylenol and/or Ibuprofen as needed for fever reduction and pain relief.   For cough: honey 1/2 to 1 teaspoon (you can dilute the honey in water or another fluid).  You can also use guaifenesin and dextromethorphan for cough. You can use a humidifier for chest congestion and cough.  If you don't have a humidifier, you can sit in the bathroom with the hot shower running.      For sore throat: try warm salt water gargles, cepacol lozenges, throat spray, warm tea or water with lemon/honey, popsicles or ice, or OTC cold relief medicine for throat discomfort.   For congestion: take a daily anti-histamine like Zyrtec, Claritin, and a oral decongestant, such as pseudoephedrine.  You can also use Flonase 1-2 sprays in each nostril daily.   It is important to stay hydrated: drink plenty of fluids (water, gatorade/powerade/pedialyte, juices, or teas) to keep your throat moisturized and help further relieve irritation/discomfort.

## 2022-12-03 NOTE — Telephone Encounter (Signed)
Agree with advice given. I do not see a need to call her about routine medications prescribed by qualified clinical staff at the urgent care
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
## 2022-12-03 NOTE — Telephone Encounter (Signed)
//   Summary: Questions about medications   The patient states she just got back from urgent care and was told to call her provider to ask about medication they prescribed her and if it is ok to take with medicine she is already taking. They prescribed her Amoxicillin 125mg  tablets, Benzonatate pearls 100 mg capsules and prednisone 20 mg tablets. Please assist patient further         Reason for Disposition . [1] Caller has URGENT medicine question about med that PCP or specialist prescribed AND [2] triager unable to answer question  Answer Assessment - Initial Assessment Questions 1. NAME of MEDICINE: "What medicine(s) are you calling about?"     Albuterol, K+, Chlorthalidone-patient was seen at [facility] and prescribed Amoxicillin-Pot Clavulanate 875-125 MG 1 tablet Oral Every 12 hours Benzonatate 100 mg Oral Every 8 hours predniSONE 40 mg Oral Daily  2. QUESTION: "What is your question?" (e.g., double dose of medicine, side effect)     Patient wants to make sure she can take these medications together without interaction 3. PRESCRIBER: "Who prescribed the medicine?" Reason: if prescribed by specialist, call should be referred to that group.     UC/PCP 4. SYMPTOMS: "Do you have any symptoms?" If Yes, ask: "What symptoms are you having?"  "How bad are the symptoms (e.g., mild, moderate, severe)     URI  Protocols used: Medication Question Call-A-AH

## 2022-12-03 NOTE — ED Triage Notes (Signed)
Headache, sore throat, fever, SOB x 2 weeks and went to the ER Friday and everything was negative. Pt now states he is still having cough and left lower side pain with decreased urine output per pt. Taking Nyquil.

## 2022-12-11 ENCOUNTER — Other Ambulatory Visit: Payer: Self-pay

## 2022-12-11 ENCOUNTER — Ambulatory Visit: Payer: Medicaid Other | Attending: Physician Assistant | Admitting: Physician Assistant

## 2022-12-11 ENCOUNTER — Other Ambulatory Visit (HOSPITAL_COMMUNITY): Payer: Self-pay

## 2022-12-11 ENCOUNTER — Encounter: Payer: Self-pay | Admitting: Pharmacist

## 2022-12-11 ENCOUNTER — Encounter: Payer: Self-pay | Admitting: Physician Assistant

## 2022-12-11 VITALS — BP 133/85 | HR 79 | Wt 219.8 lb

## 2022-12-11 DIAGNOSIS — B379 Candidiasis, unspecified: Secondary | ICD-10-CM | POA: Diagnosis not present

## 2022-12-11 DIAGNOSIS — Z09 Encounter for follow-up examination after completed treatment for conditions other than malignant neoplasm: Secondary | ICD-10-CM

## 2022-12-11 DIAGNOSIS — T3695XA Adverse effect of unspecified systemic antibiotic, initial encounter: Secondary | ICD-10-CM

## 2022-12-11 DIAGNOSIS — J069 Acute upper respiratory infection, unspecified: Secondary | ICD-10-CM | POA: Diagnosis not present

## 2022-12-11 DIAGNOSIS — S80862D Insect bite (nonvenomous), left lower leg, subsequent encounter: Secondary | ICD-10-CM | POA: Diagnosis not present

## 2022-12-11 MED ORDER — FLUTICASONE PROPIONATE 50 MCG/ACT NA SUSP
2.0000 | Freq: Every day | NASAL | 6 refills | Status: DC
  Filled 2022-12-11: qty 16, 30d supply, fill #0
  Filled 2023-04-03: qty 16, 30d supply, fill #1

## 2022-12-11 MED ORDER — FLUCONAZOLE 150 MG PO TABS
150.0000 mg | ORAL_TABLET | Freq: Once | ORAL | 0 refills | Status: AC
  Filled 2022-12-11: qty 1, 1d supply, fill #0

## 2022-12-11 NOTE — Progress Notes (Signed)
Patient ID: Nina Hopkins, female   DOB: [birth year], 62 y.o.   MRN: [id number]   Camreigh Michie, is a 62 y.o. female  WGN:[id number]  HQI:[id number]  DOB - 15-Mar-1960  Chief Complaint  Patient presents with   Hospitalization Follow-up       Subjective:   Nina Hopkins is a 62 y.o. female here today for a follow up after being seen in ED/UC twice recently for URI s/sx.  Tested negative on respiratory panel for Covid/RSV/flu.  She was out on antibiotic and has a few more days on that.  No fever since low grade temp on 12/03/2022 of 99.7.  still with sinus congestion but mucus is clear.  Mild cough but no wheezing or SOB.  She does feel tired and head feels foggy/congested.  She is taking BP safe Nyquil at bedtime.    She also what appeared to be a bite or sore of some type on her L thigh just above her knee a couple weeks ago but seems to have healed but the area is hyperpigmented and she wants me to check it.    No problems updated.  ALLERGIES: Allergies  Allergen Reactions   Prilosec [Omeprazole]     Stiff neck    PAST MEDICAL HISTORY: Past Medical History:  Diagnosis Date   Anemia    Anxiety ~ 2008   no meds   Dyspnea    hx - r/t anemia per patient   Family history of adverse reaction to anesthesia    "my sister had a hard time waking up"   History of blood transfusion 12/20/2014   related to anemia   Hyperlipidemia    Hypertension    Missed ab    x 1 - no surgery required   SVD (spontaneous vaginal delivery)    x 2    MEDICATIONS AT HOME: Prior to Admission medications   Medication Sig Start Date End Date Taking? Authorizing Provider  albuterol (VENTOLIN HFA) 108 (90 Base) MCG/ACT inhaler Inhale 1-2 puffs into the lungs every 6 (six) hours as needed for wheezing or shortness of breath. 11/28/22  Yes Hoy Register, MD  amoxicillin-clavulanate (AUGMENTIN) 875-125 MG tablet Take 1 tablet by mouth every 12 (twelve) hours for 10 days. 12/03/22 12/13/22  Yes White, Adrienne R, NP  benzonatate (TESSALON) 100 MG capsule Take 1 capsule (100 mg total) by mouth every 8 (eight) hours. 12/03/22  Yes White, Adrienne R, NP  chlorthalidone (HYGROTON) 25 MG tablet Take 1 tablet (25 mg total) by mouth daily. 11/28/22  Yes Claiborne Rigg, NP  fluconazole (DIFLUCAN) 150 MG tablet Take 1 tablet (150 mg total) by mouth once for 1 dose. 12/11/22 12/11/22 Yes Zaryia Markel, Marzella Schlein, PA-C  fluticasone (FLONASE) 50 MCG/ACT nasal spray Place 2 sprays into both nostrils daily. 12/11/22  Yes Maggie Dworkin, Marzella Schlein, PA-C  methocarbamol (ROBAXIN) 500 MG tablet Take 1 tablet (500 mg total) by mouth every 8 (eight) hours as needed for muscle spasms. 11/27/22  Yes Long, Arlyss Repress, MD  predniSONE (DELTASONE) 20 MG tablet Take 2 tablets (40 mg total) by mouth daily. 12/03/22  Yes White, Adrienne R, NP  potassium chloride SA (KLOR-CON M) 20 MEQ tablet Take 2 tablets (40 mEq total) by mouth daily for 5 days. 11/27/22 12/03/22  Long, Arlyss Repress, MD  tamsulosin (FLOMAX) 0.4 MG CAPS capsule Take 1 capsule (0.4 mg total) by mouth daily. Patient not taking: Reported on 12/11/2022 11/27/22   Long, Arlyss Repress, MD    ROS: Neg resp  Neg cardiac Neg GI Neg GU Neg MS Neg psych Neg neuro  Objective:   Vitals:   12/11/22 0943  BP: 133/85  Pulse: 79  SpO2: 92%  Weight: 219 lb 12.8 oz (99.7 kg)   Exam General appearance : Awake, alert, not in any distress. Speech Clear. Not toxic looking HEENT: Atraumatic and Normocephalic, B TM congested.  No infection.  +sinus congestion. Neck: Supple, no JVD. No cervical lymphadenopathy.  Chest: Good air entry bilaterally, CTAB.  No rales/rhonchi/wheezing CVS: S1 S2 regular, no murmurs.  Extremities: B/L Lower Ext shows no edema, both legs are warm to touch Neurology: Awake alert, and oriented X 3, CN II-XII intact, Non focal Skin: No Rash; small healed area that is hyperpigmented just above left Knee. Flat.  No scab.  No necrosis  Data Review Lab  Results  Component Value Date   HGBA1C 6.4 (H) 10/29/2022   HGBA1C 5.6 01/17/2022   HGBA1C 5.7 (H) 07/15/2021    Assessment & Plan   1. Insect bite of left lower extremity, subsequent encounter resolved  2. Upper respiratory tract infection, unspecified type Finish antibiotics - fluticasone (FLONASE) 50 MCG/ACT nasal spray; Place 2 sprays into both nostrils daily.  Dispense: 16 g; Refill: 6  3. Antibiotic-induced yeast infection In case needed - fluconazole (DIFLUCAN) 150 MG tablet; Take 1 tablet (150 mg total) by mouth once for 1 dose.  Dispense: 1 tablet; Refill: 0  4. Encounter for examination following treatment at hospital    Return for appt with Zelda as next scheduled.  The patient was given clear instructions to go to ER or return to medical center if symptoms don't improve, worsen or new problems develop. The patient verbalized understanding. The patient was told to call to get lab results if they haven't heard anything in the next week.      Georgian Co, PA-C [facility] and [address] [facility] [place], [place] [phone]   12/11/2022, 9:58 AM

## 2022-12-16 ENCOUNTER — Other Ambulatory Visit: Payer: Self-pay

## 2022-12-25 ENCOUNTER — Ambulatory Visit
Admission: RE | Admit: 2022-12-25 | Discharge: 2022-12-25 | Disposition: A | Discharge status: Home / Self Care | Payer: Medicaid Other | Source: Ambulatory Visit | Attending: Nurse Practitioner | Admitting: Nurse Practitioner

## 2022-12-25 DIAGNOSIS — Z1231 Encounter for screening mammogram for malignant neoplasm of breast: Secondary | ICD-10-CM

## 2022-12-28 ENCOUNTER — Other Ambulatory Visit: Payer: Self-pay | Admitting: Nurse Practitioner

## 2022-12-28 DIAGNOSIS — R928 Other abnormal and inconclusive findings on diagnostic imaging of breast: Secondary | ICD-10-CM

## 2023-01-02 ENCOUNTER — Other Ambulatory Visit: Payer: Self-pay

## 2023-01-07 ENCOUNTER — Ambulatory Visit
Admission: RE | Admit: 2023-01-07 | Discharge: 2023-01-07 | Disposition: A | Discharge status: Home / Self Care | Payer: Medicaid Other | Source: Ambulatory Visit | Attending: Nurse Practitioner | Admitting: Nurse Practitioner

## 2023-01-07 ENCOUNTER — Other Ambulatory Visit: Payer: Self-pay

## 2023-01-07 ENCOUNTER — Ambulatory Visit: Payer: Medicaid Other

## 2023-01-07 DIAGNOSIS — R928 Other abnormal and inconclusive findings on diagnostic imaging of breast: Secondary | ICD-10-CM

## 2023-01-27 ENCOUNTER — Other Ambulatory Visit (HOSPITAL_COMMUNITY): Payer: Self-pay

## 2023-02-26 ENCOUNTER — Encounter: Payer: Self-pay | Admitting: Physician Assistant

## 2023-02-26 ENCOUNTER — Telehealth: Payer: Self-pay | Admitting: Physician Assistant

## 2023-02-26 ENCOUNTER — Ambulatory Visit: Payer: Medicaid Other | Attending: Physician Assistant | Admitting: Physician Assistant

## 2023-02-26 ENCOUNTER — Other Ambulatory Visit: Payer: Self-pay

## 2023-02-26 VITALS — BP 136/90 | HR 86 | Wt 220.6 lb

## 2023-02-26 DIAGNOSIS — Z1331 Encounter for screening for depression: Secondary | ICD-10-CM

## 2023-02-26 DIAGNOSIS — R399 Unspecified symptoms and signs involving the genitourinary system: Secondary | ICD-10-CM

## 2023-02-26 DIAGNOSIS — M2530 Other instability, unspecified joint: Secondary | ICD-10-CM | POA: Diagnosis not present

## 2023-02-26 DIAGNOSIS — F419 Anxiety disorder, unspecified: Secondary | ICD-10-CM | POA: Diagnosis not present

## 2023-02-26 DIAGNOSIS — L989 Disorder of the skin and subcutaneous tissue, unspecified: Secondary | ICD-10-CM | POA: Diagnosis not present

## 2023-02-26 DIAGNOSIS — Z139 Encounter for screening, unspecified: Secondary | ICD-10-CM | POA: Diagnosis not present

## 2023-02-26 DIAGNOSIS — I10 Essential (primary) hypertension: Secondary | ICD-10-CM

## 2023-02-26 LAB — POCT URINALYSIS DIP (CLINITEK)
Bilirubin, UA: NEGATIVE
Blood, UA: NEGATIVE
Glucose, UA: NEGATIVE mg/dL
Ketones, POC UA: NEGATIVE mg/dL
Leukocytes, UA: NEGATIVE
Nitrite, UA: NEGATIVE
POC PROTEIN,UA: NEGATIVE
Spec Grav, UA: 1.01 (ref 1.010–1.025)
Urobilinogen, UA: 0.2 U/dL
pH, UA: 6.5 (ref 5.0–8.0)

## 2023-02-26 MED ORDER — CHLORTHALIDONE 25 MG PO TABS
25.0000 mg | ORAL_TABLET | Freq: Every day | ORAL | 0 refills | Status: DC
  Filled 2023-02-26 – 2023-04-03 (×2): qty 90, 90d supply, fill #0

## 2023-02-26 MED ORDER — ESCITALOPRAM OXALATE 10 MG PO TABS
10.0000 mg | ORAL_TABLET | Freq: Every day | ORAL | 3 refills | Status: DC
  Filled 2023-02-26: qty 30, 30d supply, fill #0
  Filled 2023-04-03: qty 30, 30d supply, fill #1
  Filled 2023-04-28: qty 30, 30d supply, fill #2
  Filled 2023-07-01: qty 30, 30d supply, fill #3

## 2023-02-26 NOTE — Telephone Encounter (Signed)
+  PHQ 9 #9=0.  See my office visit note from 02/26/2023.  She would benefit from some counseling!  Thanks, Georgian Co, PA-C

## 2023-02-26 NOTE — Patient Instructions (Addendum)
"Our deepest fear is not that we are inadequate. Our deepest fear is that we are powerful beyond measure. It is our light, not our darkness that most frightens [place]. We ask ourselves, Who am I to be brilliant, gorgeous, talented, fabulous? Actually, who are you not to be? You are a child of God. Your playing small does not serve the world. There is nothing enlightened about shrinking so that other people won't feel insecure around you. We are all meant to shine, as children do. We were born to make manifest the glory of God that is within [place]. It's not just in some of [place]; it's in everyone. And as we let our own light shine, we unconsciously give other people permission to do the same. As we are liberated from our own fear, our presence automatically liberates others."    Generalized Anxiety Disorder, Adult Generalized anxiety disorder (GAD) is a mental health condition. Unlike normal worries, anxiety related to GAD is not triggered by a specific event. These worries do not fade or get better with time. GAD interferes with relationships, work, and school. GAD symptoms can vary from mild to severe. People with severe GAD can have intense waves of anxiety with physical symptoms that are similar to panic attacks.     What are the causes? The exact cause of GAD is not known, but the following are believed to have an impact: Differences in natural brain chemicals. Genes passed down from parents to children. Differences in the way threats are perceived. Development and stress during childhood. Personality. What increases the risk? The following factors may make you more likely to develop this condition: Being female. Having a family history of anxiety disorders. Being very shy. Experiencing very stressful life events, such as the death of a loved one. Having a very stressful family environment. What are the signs or symptoms? People with GAD often worry excessively about many things in their lives,  such as their health and family. Symptoms may also include: Mental and emotional symptoms: Worrying excessively about natural disasters. Fear of being late. Difficulty concentrating. Fears that others are judging your performance. Physical symptoms: Fatigue. Headaches, muscle tension, muscle twitches, trembling, or feeling shaky. Feeling like your heart is pounding or beating very fast. Feeling out of breath or like you cannot take a deep breath. Having trouble falling asleep or staying asleep, or experiencing restlessness. Sweating. Nausea, diarrhea, or irritable bowel syndrome (IBS). Behavioral symptoms: Experiencing erratic moods or irritability. Avoidance of new situations. Avoidance of people. Extreme difficulty making decisions. How is this diagnosed? This condition is diagnosed based on your symptoms and medical history. You will also have a physical exam. Your health care provider may perform tests to rule out other possible causes of your symptoms. To be diagnosed with GAD, a person must have anxiety that: Is out of his or her control. Affects several different aspects of his or her life, such as work and relationships. Causes distress that makes him or her unable to take part in normal activities. Includes at least three symptoms of GAD, such as restlessness, fatigue, trouble concentrating, irritability, muscle tension, or sleep problems. Before your health care provider can confirm a diagnosis of GAD, these symptoms must be present more days than they are not, and they must last for 6 months or longer. How is this treated? This condition may be treated with: Medicine. Antidepressant medicine is usually prescribed for long-term daily control. Anti-anxiety medicines may be added in severe cases, especially when panic attacks  occur. Talk therapy (psychotherapy). Certain types of talk therapy can be helpful in treating GAD by providing support, education, and guidance. Options  include: Cognitive behavioral therapy (CBT). People learn coping skills and self-calming techniques to ease their physical symptoms. They learn to identify unrealistic thoughts and behaviors and to replace them with more appropriate thoughts and behaviors. Acceptance and commitment therapy (ACT). This treatment teaches people how to be mindful as a way to cope with unwanted thoughts and feelings. Biofeedback. This process trains you to manage your body's response (physiological response) through breathing techniques and relaxation methods. You will work with a therapist while machines are used to monitor your physical symptoms. Stress management techniques. These include yoga, meditation, and exercise. A mental health specialist can help determine which treatment is best for you. Some people see improvement with one type of therapy. However, other people require a combination of therapies. Follow these instructions at home: Lifestyle Maintain a consistent routine and schedule. Anticipate stressful situations. Create a plan and allow extra time to work with your plan. Practice stress management or self-calming techniques that you have learned from your therapist or your health care provider. Exercise regularly and spend time outdoors. Eat a healthy diet that includes plenty of vegetables, fruits, whole grains, low-fat dairy products, and lean protein. Do not eat a lot of foods that are high in fat, added sugar, or salt (sodium). Drink plenty of water. Avoid alcohol. Alcohol can increase anxiety. Avoid caffeine and certain over-the-counter cold medicines. These may make you feel worse. Ask your pharmacist which medicines to avoid. General instructions Take over-the-counter and prescription medicines only as told by your health care provider. Understand that you are likely to have setbacks. Accept this and be kind to yourself as you persist to take better care of yourself. Anticipate stressful  situations. Create a plan and allow extra time to work with your plan. Recognize and accept your accomplishments, even if you judge them as small. Spend time with people who care about you. Keep all follow-up visits. This is important. Where to find more information [employer] of Mental [employer]: http://www.maynard.net/ Substance Abuse and Mental Health Services: [employer].pt Contact a health care provider if: Your symptoms do not get better. Your symptoms get worse. You have signs of depression, such as: A persistently sad or irritable mood. Loss of enjoyment in activities that used to bring you joy. Change in weight or eating. Changes in sleeping habits. Get help right away if: You have thoughts about hurting yourself or others. If you ever feel like you may hurt yourself or others, or have thoughts about taking your own life, get help right away. Go to your nearest emergency department or: Call your local emergency services (911 in the [place] Call a suicide crisis helpline, such as the National Suicide Prevention Lifeline at (743) 199-8145 or 988 in the [place] This is open 24 hours a day in the [place] If you're a Veteran: Call 988 and press 1. This is open 24 hours a day. Text the [employer] at (516)277-6795. Summary Generalized anxiety disorder (GAD) is a mental health condition that involves worry that is not triggered by a specific event. People with GAD often worry excessively about many things in their lives, such as their health and family. GAD may cause symptoms such as restlessness, trouble concentrating, sleep problems, frequent sweating, nausea, diarrhea, headaches, and trembling or muscle twitching. A mental health specialist can help determine which treatment is best for you. Some people see improvement with  one type of therapy. However, other people require a combination of therapies. This information is not intended to replace advice given to you by your health care  provider. Make sure you discuss any questions you have with your health care provider. Document Revised: 09/04/2022 Document Reviewed: 05/13/2020 Elsevier Patient Education  2024 [employer].
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
## 2023-02-27 LAB — SEDIMENTATION RATE: Sed Rate: 16 mm/h (ref 0–40)

## 2023-02-27 LAB — VITAMIN D 25 HYDROXY (VIT D DEFICIENCY, FRACTURES): Vit D, 25-Hydroxy: 21.9 ng/mL — ABNORMAL LOW (ref 30.0–100.0)

## 2023-02-27 LAB — TSH: TSH: 1.8 u[IU]/mL (ref 0.450–4.500)

## 2023-03-02 NOTE — Telephone Encounter (Signed)
She has an appt with me tomorrow.

## 2023-03-04 ENCOUNTER — Telehealth (INDEPENDENT_AMBULATORY_CARE_PROVIDER_SITE_OTHER): Payer: Self-pay | Admitting: Licensed Clinical Social Worker

## 2023-03-04 ENCOUNTER — Encounter (INDEPENDENT_AMBULATORY_CARE_PROVIDER_SITE_OTHER): Payer: Self-pay

## 2023-03-04 ENCOUNTER — Encounter: Payer: Self-pay | Admitting: Physician Assistant

## 2023-03-04 ENCOUNTER — Other Ambulatory Visit: Payer: Self-pay | Admitting: Physician Assistant

## 2023-03-04 ENCOUNTER — Other Ambulatory Visit: Payer: Self-pay

## 2023-03-04 DIAGNOSIS — E559 Vitamin D deficiency, unspecified: Secondary | ICD-10-CM

## 2023-03-04 MED ORDER — VITAMIN D (ERGOCALCIFEROL) 1.25 MG (50000 UNIT) PO CAPS
50000.0000 [IU] | ORAL_CAPSULE | ORAL | 0 refills | Status: DC
  Filled 2023-03-04: qty 4, 28d supply, fill #0
  Filled 2023-04-03: qty 4, 28d supply, fill #1
  Filled 2023-07-01: qty 4, 28d supply, fill #2
  Filled 2023-08-12 – 2023-09-11 (×2): qty 4, 28d supply, fill #3

## 2023-03-04 NOTE — Telephone Encounter (Deleted)
LCSWA called patient today to introduce herself and to assess patients' mental health needs. Patient did answer the phone and speak with me a little bit about what she has going on and what her current life stressors are . LCSWA was able to let the pt know we will send her counseling resources via my chart and guided her letting her know where the best place for her to start would be for her current situation. Patient was referred by PCP for anxiety.

## 2023-03-04 NOTE — Telephone Encounter (Signed)
LCSWA called patient today to introduce herself and to assess patients' mental health needs. Patient did answer the phone and we were able to speak to her about her current situation/life stressors that are causing her anxiety . LCSWA was able to speak with the pt about her living situation and what she is looking for in services we were able to provide the pt with counseling resources via my chart at this times. Patient was referred by PCP for anxiety.

## 2023-03-05 ENCOUNTER — Telehealth: Payer: Self-pay

## 2023-03-05 NOTE — Telephone Encounter (Signed)
Patient viewed results and Doctor comment through [facility]

## 2023-03-05 NOTE — Telephone Encounter (Signed)
-----   Message from Georgian Co sent at 03/04/2023  8:34 AM EST ----- Your vitamin D is low.  This can contribute to muscle aches, anxiety, fatigue, and depression.  I have sent a prescription to the pharmacy for them to take once a week.  We will recheck this level in 3-4 months.  Inflammatory rate and thyroid are normal.  Thanks, Georgian Co, PA-C

## 2023-03-06 ENCOUNTER — Other Ambulatory Visit: Payer: Self-pay | Admitting: Licensed Clinical Social Worker

## 2023-03-06 NOTE — Patient Instructions (Signed)
Visit Information  Nina Hopkins was given information about Medicaid Managed Care team care coordination services as a part of their [facility] Community Plan Medicaid benefit. Nina Hopkins verbally consented to engagement with the [facility] Managed Care team.   If you are experiencing a medical emergency, please call 911 or report to your local emergency department or urgent care.   If you have a non-emergency medical problem during routine business hours, please contact your provider's office and ask to speak with a nurse.   For questions related to your [facility], please call: [phone] or visit the homepage here: [email or web address]  If you would like to schedule transportation through your [facility], please call the following number at least 2 days in advance of your appointment: [phone]   Rides for urgent appointments can also be made after hours by calling Member Services.  Call the Behavioral Health Crisis Line at [phone], at any time, 24 hours a day, 7 days a week. If you are in danger or need immediate medical attention call 911.  If you would like help to quit smoking, call 1-800-QUIT-NOW (9704927653) OR Espaol: 1-855-Djelo-Ya (1-324-401-0272) o para ms informacin haga clic aqu or Text READY to 536-644 to register via text  Following is a copy of your plan of care:  Care Plan : LCSW Plan of Care  Updates made by Gustavus Bryant, LCSW since 03/06/2023 12:00 AM     Problem: Coping Skills (General Plan of Care)      Goal: Coping Skills Enhanced   Start Date: 03/06/2023  Note:   Timeframe:  Short-Range Goal Priority:  High Start Date:   03/06/23             Expected End Date:  ongoing                     Follow Up Date--03/24/23 at 230 pm  - keep 90 percent of scheduled appointments -consider counseling or psychiatry -consider  bumping up your self-care  -consider creating a stronger support network   Why is this important?             Combating depression may take some time.            If you don't feel better right away, don't give up on your treatment plan.    Current barriers:   Chronic Mental Health needs related to symptoms of depression, stress and anxiety. Patient requires Support, Education, Resources, Referrals, Advocacy, and Care Coordination, in order to meet Unmet Mental Health Needs and to find a therapist and psychiatrist. Mental Health Concerns and Social Isolation Patient lacks knowledge of local and available community counseling agencies and resources.  Clinical Goal(s): verbalize understanding of plan for management of Anxiety, Depression, and Stress symptoms and demonstrate a reduction in symptoms. Patient will connect with a provider for ongoing mental health treatment, increase coping skills, healthy habits, self-management skills, and stress reduction      Patient will implement clinical interventions discussed today to decrease symptoms of depression and increase knowledge and/or ability of: coping skills.    Patient Goals/Self-Care Activities: Take medications as prescribed   Attend all scheduled provider appointments Call pharmacy for medication refills 3-7 days in advance of running out of medications Perform all self care activities independently  Perform IADL's (shopping, preparing meals, housekeeping, managing finances) independently Call provider office for new concerns or questions Work with the social worker to address care coordination  needs and will continue to work with the clinical team to address health care and disease management related needs call 1-800-273-TALK (toll free, 24 hour hotline) If in a crisis, CALL 988 or go to [facility] [address], [place] [phone]) Utilize healthy coping skills and supportive resources  discussed Contact PCP with any questions or concerns Keep 90 percent of counseling appointments Call your insurance provider for more information about your Enhanced Benefits  Check out counseling resources provided  Begin personal counseling with LCSW, to reduce and manage symptoms of Depression and Stress, until well-established with mental health provider Accept all calls from representative with Behavioral Health Providers in an effort to establish ongoing mental health counseling and supportive services. Incorporate into daily practice - relaxation techniques, deep breathing exercises, and mindfulness meditation strategies. Talk about feelings with friends, family members, spiritual advisor, etc. Contact LCSW directly [phone]), if you have questions, need assistance, or if additional social work needs are identified between now and our next scheduled telephone outreach call. Call 988 for mental health hotline/crisis line if needed (24/7 available) Try techniques to reduce symptoms of anxiety/negative thinking (deep breathing, distraction, positive self talk, etc)  - develop a personal safety plan - exercise at least 2 to 3 times per week - have a plan for how to handle bad days - journal feelings and what helps to feel better or worse - spend time or talk with others at least 2 to 3 times per week - watch for early signs of feeling worse - begin personal counseling - call and visit an old friend - check out volunteer opportunities - join a support group - laugh; watch a funny movie or comedian - learn and use visualization or guided imagery - perform a random act of kindness - practice relaxation or meditation daily - start or continue a personal journal - practice positive thinking and self-talk -continue with compliance of taking medication  -identify current effective and ineffective coping strategies.  -implement positive self-talk in care to increase self-esteem,  confidence and feelings of control.  -consider alternative and complementary therapy approaches such as meditation, mindfulness or yoga.  Call your insurance provider to gain education on benefits if desired Call your primary care doctor if symptoms get worse  -journaling, prayer, worship services, meditation or pastoral counseling.  -increase participation in pleasurable group activities such as hobbies, singing, sports or volunteering).  -consider the use of meditative movement therapy such as tai chi or yoga  -start a regular daily exercise program based on tolerance, ability and patient choice to support positive thinking and activity    Follow Up Plan:  The patient has been provided with contact information for the care management team and has been advised to call with any mental health or health related questions or concerns.  The care management team will reach out to the patient again over the next 30 business  days.   If you are experiencing a Mental Health or Behavioral Health Crisis or need someone to talk to, please call the Suicide and Crisis Lifeline: 988    Patient Goals: Initial goal     24- Hour Availability:    [facility]  [address] [place], [place] [phone] Crisis [phone]   [facility] [phone]  [place] [facility]  [phone]    [facility]  [phone] (after hours)   Therapeutic Alternative/Mobile Crisis   [phone]  [place] National Suicide Hotline  949 560 5525 (TALK) OR 988   Call 988 for mental health emergencies   [employer]  ([phone]) 102-4209);  [place] and [employer]  [phone]); [place], [place], [place], [place], [place], [place], [employer]    [facility] for [facility] Residents For 24/7 walk-up access to mental health services for [facility] children (4+), adolescents  and adults, please visit the [facility] located at [address] in [place], [place].  *[place] also provides comprehensive outpatient behavioral health services in a variety of locations around the [place].  Connect With [place] [address] [place], [place] [address] HelpLine: ([phone])592-0181 or 1-380 192 2214  Get Directions  Find Help 24/7 By Phone Call our 24-hour HelpLine at 208-302-0539 or 504-193-3797 for immediate assistance for mental health and substance abuse issues.  Walk-In Help [place]: [facility] (Ages 4 and Up) [place]: Emergency Dept., [facility] Additional Resources National Hopeline Network: 1-800-SUICIDE The National Suicide Prevention Lifeline: 1-800-273-TALK     The following coping skill education was provided for stress relief and mental health management: "When your car dies or a deadline looms, how do you respond? Long-term, low-grade or acute stress takes a serious toll on your body and mind, so don't ignore feelings of constant tension. Stress is a natural part of life. However, too much stress can harm our health, especially if it continues every day. This is chronic stress and can put you at risk for heart problems like heart disease and depression. Understand what's happening inside your body and learn simple coping skills to combat the negative impacts of everyday stressors.  Types of Stress There are two types of stress: Emotional - types of emotional stress are relationship problems, pressure at work, financial worries, experiencing discrimination or having a major life change. Physical - Examples of physical stress include being sick having pain, not sleeping well, recovery from an injury or having an alcohol and drug use disorder. Fight or Flight Sudden or ongoing stress activates your nervous system and floods your bloodstream with adrenaline and  cortisol, two hormones that raise blood pressure, increase heart rate and spike blood sugar. These changes pitch your body into a fight or flight response. That enabled our ancestors to outrun saber-toothed tigers, and it's helpful today for situations like dodging a car accident. But most modern chronic stressors, such as finances or a challenging relationship, keep your body in that heightened state, which hurts your health. Effects of Too Much Stress If constantly under stress, most of [place] will eventually start to function less well.  Multiple studies link chronic stress to a higher risk of heart disease, stroke, depression, weight gain, memory loss and even premature death, so it's important to recognize the warning signals. Talk to your doctor about ways to manage stress if you're experiencing any of these symptoms: Prolonged periods of poor sleep. Regular, severe headaches. Unexplained weight loss or gain. Feelings of isolation, withdrawal or worthlessness. Constant anger and irritability. Loss of interest in activities. Constant worrying or obsessive thinking. Excessive alcohol or drug use. Inability to concentrate.  10 Ways to Cope with Chronic Stress It's key to recognize stressful situations as they occur because it allows you to focus on managing how you react. We all need to know when to close our eyes and take a deep breath when we feel tension rising. Use these tips to prevent or reduce chronic stress. 1. Rebalance  Work and Home All work and no play? If you're spending too much time at the office, intentionally put more dates in your calendar to enjoy time for fun, either alone or with others. 2. Get Regular Exercise Moving your body on a regular basis balances the nervous system and increases blood circulation, helping to flush out stress hormones. Even a daily 20-minute walk makes a difference. Any kind of exercise can lower stress and improve your mood ? just pick activities that  you enjoy and make it a regular habit. 3. Eat Well and Limit Alcohol and Stimulants Alcohol, nicotine and caffeine may temporarily relieve stress but have negative health impacts and can make stress worse in the long run. Well-nourished bodies cope better, so start with a good breakfast, add more organic fruits and vegetables for a well-balanced diet, avoid processed foods and sugar, try herbal tea and drink more water. 4. Connect with Supportive People Talking face to face with another person releases hormones that reduce stress. Lean on those good listeners in your life. 5. Carve Out Hobby Time Do you enjoy gardening, reading, listening to music or some other creative pursuit? Engage in activities that bring you pleasure and joy; research shows that reduces stress by almost half and lowers your heart rate, too. 6. Practice Meditation, Stress Reduction or Yoga Relaxation techniques activate a state of restfulness that counterbalances your body's fight-or-flight hormones. Even if this also means a 10-minute break in a long day: listen to music, read, go for a walk in nature, do a hobby, take a bath or spend time with a friend. Also consider doing a mindfulness exercise or try a daily deep breathing or imagery practice. Deep Breathing Slow, calm and deep breathing can help you relax. Try these steps to focus on your breathing and repeat as needed. Find a comfortable position and close your eyes. Exhale and drop your shoulders. Breathe in through your nose; fill your lungs and then your belly. Think of relaxing your body, quieting your mind and becoming calm and peaceful. Breathe out slowly through your nose, relaxing your belly. Think of releasing tension, pain, worries or distress. Repeat steps three and four until you feel relaxed. Imagery This involves using your mind to excite the senses -- sound, vision, smell, taste and feeling. This may help ease your stress. Begin by getting comfortable and  then do some slow breathing. Imagine a place you love being at. It could be somewhere from your childhood, somewhere you vacationed or just a place in your imagination. Feel how it is to be in the place you're imagining. Pay attention to the sounds, air, colors, and who is there with you. This is a place where you feel cared for and loved. All is well. You are safe. Take in all the smells, sounds, tastes and feelings. As you do, feel your body being nourished and healed. Feel the calm that surrounds you. Breathe in all the good. Breathe out any discomfort or tension. 7. Sleep Enough If you get less than seven to eight hours of sleep, your body won't tolerate stress as well as it could. If stress keeps you up at night, address the cause, and add extra meditation into your day to make up for the lost z's. Try to get seven to nine hours of sleep each night. Make a regular bedtime schedule. Keep your room dark and cool. Try to avoid computers, TV, cell phones and tablets before bed. 8. Bond with Connections You Enjoy Go out  for a coffee with a friend, chat with a neighbor, call a family member, visit with a clergy member, or even hang out with your pet. Clinical studies show that spending even a short time with a companion animal can cut anxiety levels almost in half. 9. Take a Vacation Getting away from it all can reset your stress tolerance by increasing your mental and emotional outlook, which makes you a happier, more productive person upon return. Leave your cellphone and laptop at home! 10. See a Counselor, Coach or Therapist If negative thoughts overwhelm your ability to make positive changes, it's time to seek professional help. Make an appointment today--your health and life are worth it."  Dickie La, BSW, MSW, LCSW Licensed Clinical Social Worker [employer]   [email or web address] Direct Dial: [phone]

## 2023-03-06 NOTE — Patient Outreach (Signed)
Medicaid Managed Care Social Work Note  03/06/2023 Name:  Nina Hopkins MRN:  [id number] DOB:  1960-06-26  Nina Hopkins is an 63 y.o. year old female who is a primary patient of Claiborne Rigg, NP.  The Medicaid Managed Care Coordination team was consulted for assistance with:  Mental Health Counseling and Resources  Ms. Tremain was given information about Medicaid Managed Care Coordination team services today. Nina Hopkins Patient agreed to services and verbal consent obtained.  Engaged with patient  for by telephone forinitial visit in response to referral for case management and/or care coordination services.   Patient is participating in a Managed Medicaid Plan:  Yes  Assessments/Interventions:  Review of past medical history, allergies, medications, health status, including review of consultants reports, laboratory and other test data, was performed as part of comprehensive evaluation and provision of chronic care management services.  SDOH: (Social Drivers of Health) assessments and interventions performed: SDOH Interventions    Flowsheet Row Office Visit from 02/26/2023 in [facility] - A Dept Of [place]. [facility] Office Visit from 10/29/2022 in [facility] - A Dept Of Eligha Bridegroom. [facility] Clinical Support from 10/17/2021 in [facility] Office Visit from 10/26/2020 in [facility] - A Dept Of Eligha Bridegroom. [facility]  SDOH Interventions      Food Insecurity Interventions Other (Comment)  [patient was approved for snap benefits] -- -- --  Housing Interventions AMB Referral -- -- --  Transportation Interventions Intervention Not Indicated -- Patient Resources (Friends/Family) --  Utilities Interventions Intervention Not Indicated -- -- --  Alcohol Usage Interventions Intervention Not Indicated (Score <7) -- -- --  Depression Interventions/Treatment   -- Patient refuses Treatment -- Medication  Financial Strain Interventions Intervention Not Indicated -- -- --  Physical Activity Interventions Intervention Not Indicated -- -- --  Stress Interventions Other (Comment)  [amb referral] -- -- --  Social Connections Interventions Intervention Not Indicated -- -- --  Health Literacy Interventions Intervention Not Indicated -- -- --       Advanced Directives Status:  See Care Plan for related entries.  Care Plan                 Allergies  Allergen Reactions   Prilosec [Omeprazole]     Stiff neck    Medications Reviewed Today     Reviewed by Gustavus Bryant, LCSW (Social Worker) on 03/06/23 at 1440  Med List Status: <None>   Medication Order Taking? Sig Documenting Provider Last Dose Status Informant  albuterol (VENTOLIN HFA) 108 (90 Base) MCG/ACT inhaler [id number] No Inhale 1-2 puffs into the lungs every 6 (six) hours as needed for wheezing or shortness of breath.  Patient not taking: Reported on 02/26/2023   Hoy Register, MD Not Taking Active   chlorthalidone (HYGROTON) 25 MG tablet [id number]  Take 1 tablet (25 mg total) by mouth daily. Georgian Co M, PA-C  Active   escitalopram (LEXAPRO) 10 MG tablet [id number]  Take 1 tablet (10 mg total) by mouth daily. Georgian Co M, PA-C  Active   fluticasone [facility]) 50 MCG/ACT nasal spray [id number] No Place 2 sprays into both nostrils daily. Anders Simmonds, PA-C Taking Active            Med Note [facility], Wenda Low I   Thu Feb 26, 2023  8:52 AM) As needed   methocarbamol (ROBAXIN) 500 MG tablet [id number] No Take 1  tablet (500 mg total) by mouth every 8 (eight) hours as needed for muscle spasms.  Patient not taking: Reported on 02/26/2023   Maia Plan, MD Not Taking Active   tamsulosin [facility]) 0.4 MG CAPS capsule [id number] No Take 1 capsule (0.4 mg total) by mouth daily.  Patient not taking: Reported on 02/26/2023   Maia Plan, MD Not Taking Active            Med Note [facility],  Wenda Low I   Thu Feb 26, 2023  8:52 AM) As needed  Vitamin D, Ergocalciferol, (DRISDOL) 1.25 MG (50000 UNIT) CAPS capsule [id number]  Take 1 capsule (50,000 Units total) by mouth every 7 (seven) days. Anders Simmonds, [place]  Active             Patient Active Problem List   Diagnosis Date Noted   Prediabetes 03/22/2020   Dyslipidemia, goal LDL below 70 03/22/2020   HTN (hypertension) 05/14/2017   Post-operative state 03/17/2017   Back pain, thoracic 12/21/2014   Anemia 12/20/2014   Dyspnea 12/20/2014    Conditions to be addressed/monitored per PCP order:  Anxiety and Depression  Care Plan : LCSW Plan of Care  Updates made by Gustavus Bryant, LCSW since 03/06/2023 12:00 AM     Problem: Coping Skills (General Plan of Care)      Goal: Coping Skills Enhanced   Start Date: 03/06/2023  Note:   Timeframe:  Short-Range Goal Priority:  High Start Date:   03/06/23             Expected End Date:  ongoing                     Follow Up Date--03/24/23 at 230 pm  - keep 90 percent of scheduled appointments -consider counseling or psychiatry -consider bumping up your self-care  -consider creating a stronger support network   Why is this important?             Combating depression may take some time.            If you don't feel better right away, don't give up on your treatment plan.    Current barriers:   Chronic Mental Health needs related to symptoms of depression, stress and anxiety. Patient requires Support, Education, Resources, Referrals, Advocacy, and Care Coordination, in order to meet Unmet Mental Health Needs and to find a therapist and psychiatrist. Mental Health Concerns and Social Isolation Patient lacks knowledge of local and available community counseling agencies and resources.  Clinical Goal(s): verbalize understanding of plan for management of Anxiety, Depression, and Stress symptoms and demonstrate a reduction in symptoms. Patient will connect with a provider for  ongoing mental health treatment, increase coping skills, healthy habits, self-management skills, and stress reduction      Patient will implement clinical interventions discussed today to decrease symptoms of depression and increase knowledge and/or ability of: coping skills.    Clinical Interventions:  Assessed patient's previous and current treatment, coping skills, support system and barriers to care. Patient provided hx. Patient has very strong support system that includes her two daughters and sister.  Verbalization of feelings encouraged, motivational interviewing employed Emotional support provided, positive coping strategies explored. Establishing healthy boundaries emphasized and healthy self-care education provided Patient was educated on available mental health resources within their area that accept Medicaid and offer counseling and psychiatry. Patient was advised to contact the back of her insurance card for assistance with benefits as well. Patient  educated on the difference between therapy and psychiatry per patient request Email sent to patient today with available mental health resources within her area that accept Medicaid and offer the services that she is interested in. Email included instructions for scheduling at [facility] as well as some crisis support resources and [facility]'s walk in clinic hours. Patient will review resources over the next two-three weeks and make a decision regarding where she wishes to gain MH treatment at. Emotional support provided. CBT intervention implemented regarding "being mentally fit" by combating negative thinking and replacing it with uplifting support, hope and positivity. Patient reports that she was recently put on Lexapro to alleviate her symptoms. Education on activities to assist with pain reduction (distraction, imagery, relaxation, massage, acupressure, TENS, heat, and cold application; Reviewed with patient prescribed pharmacological and  nonpharmacological pain relief strategies; LCSW provided education on relaxation techniques such as meditation, deep breathing, massage, grounding exercises or yoga that can activate the body's relaxation response and ease symptoms of stress and anxiety. LCSW ask that when pt is struggling with difficult emotions and racing thoughts that they start this relaxation response process. LCSW provided extensive education on healthy coping skills for anxiety. SW used active and reflective listening, validated patient's feelings/concerns, and provided emotional support. Assessed social determinant of health barriers Patient will work on implementing appropriate self-care habits into their daily routine such as: staying positive, writing a gratitude list, drinking water, staying active around the house, taking their medications as prescribed, combating negative thoughts or emotions and staying connected with their family and friends. Positive reinforcement provided for this decision to work on this. Motivational Interviewing employed Depression screen reviewed  PHQ2/ PHQ9 completed or reviewed  Mindfulness or Relaxation training provided Active listening / Reflection utilized  Advance Care and HCPOA education provided Emotional Support Provided Problem Solving /Task Center strategies reviewed Provided psychoeducation for mental health needs  Provided brief CBT  Reviewed mental health medications and discussed importance of compliance:  Quality of sleep assessed & Sleep Hygiene techniques promoted  Participation in counseling encouraged  Verbalization of feelings encouraged  Suicidal Ideation/Homicidal Ideation assessed: Patient denies SI/HI  Review resources, discussed options and provided patient information about  Mental Health Resources Inter-disciplinary care team collaboration (see longitudinal plan of care)  Patient Goals/Self-Care Activities: Take medications as prescribed   Attend all  scheduled provider appointments Call pharmacy for medication refills 3-7 days in advance of running out of medications Perform all self care activities independently  Perform IADL's (shopping, preparing meals, housekeeping, managing finances) independently Call provider office for new concerns or questions Work with the social worker to address care coordination needs and will continue to work with the clinical team to address health care and disease management related needs call 1-800-273-TALK (toll free, 24 hour hotline) If in a crisis, CALL 988 or go to [facility] [address] [place] [phone]) Utilize healthy coping skills and supportive resources discussed Contact PCP with any questions or concerns Keep 90 percent of counseling appointments Call your insurance provider for more information about your Enhanced Benefits  Check out counseling resources provided  Begin personal counseling with LCSW, to reduce and manage symptoms of Depression and Stress, until well-established with mental health provider Accept all calls from representative with Behavioral Health Providers in an effort to establish ongoing mental health counseling and supportive services. Incorporate into daily practice - relaxation techniques, deep breathing exercises, and mindfulness meditation strategies. Talk about feelings with friends, family members, spiritual advisor, etc.  Contact LCSW directly [phone]), if you have questions, need assistance, or if additional social work needs are identified between now and our next scheduled telephone outreach call. Call 988 for mental health hotline/crisis line if needed (24/7 available) Try techniques to reduce symptoms of anxiety/negative thinking (deep breathing, distraction, positive self talk, etc)  - develop a personal safety plan - exercise at least 2 to 3 times per week - have a plan for how to handle bad days - journal  feelings and what helps to feel better or worse - spend time or talk with others at least 2 to 3 times per week - watch for early signs of feeling worse - begin personal counseling - call and visit an old friend - check out volunteer opportunities - join a support group - laugh; watch a funny movie or comedian - learn and use visualization or guided imagery - perform a random act of kindness - practice relaxation or meditation daily - start or continue a personal journal - practice positive thinking and self-talk -continue with compliance of taking medication  -identify current effective and ineffective coping strategies.  -implement positive self-talk in care to increase self-esteem, confidence and feelings of control.  -consider alternative and complementary therapy approaches such as meditation, mindfulness or yoga.  Call your insurance provider to gain education on benefits if desired Call your primary care doctor if symptoms get worse  -journaling, prayer, worship services, meditation or pastoral counseling.  -increase participation in pleasurable group activities such as hobbies, singing, sports or volunteering).  -consider the use of meditative movement therapy such as tai chi or yoga  -start a regular daily exercise program based on tolerance, ability and patient choice to support positive thinking and activity    Follow Up Plan:  The patient has been provided with contact information for the care management team and has been advised to call with any mental health or health related questions or concerns.  The care management team will reach out to the patient again over the next 30 business  days.   If you are experiencing a Mental Health or Behavioral Health Crisis or need someone to talk to, please call the Suicide and Crisis Lifeline: 988    Patient Goals: Initial goal       Follow up:  Patient agrees to Care Plan and Follow-up.  Plan: The Managed Medicaid care  management team will reach out to the patient again over the next 30 days.  Dickie La, BSW, MSW, LCSW Licensed Clinical Social Worker [employer]   [email or web address] Direct Dial: [phone]

## 2023-03-24 ENCOUNTER — Other Ambulatory Visit: Payer: Self-pay | Admitting: Licensed Clinical Social Worker

## 2023-03-24 DIAGNOSIS — F4323 Adjustment disorder with mixed anxiety and depressed mood: Secondary | ICD-10-CM

## 2023-03-24 NOTE — Patient Outreach (Addendum)
 Medicaid Managed Care Social Work Note  03/24/2023 Name:  Nina Hopkins MRN:  [id number] DOB:  1960/04/15  Nina Hopkins is an 63 y.o. year old female who is a primary patient of Claiborne Rigg, NP.  The Medicaid Managed Care Coordination team was consulted for assistance with:  Mental Health Counseling and Resources  Nina Hopkins was given information about Medicaid Managed Care Coordination team services today. Nina Hopkins Patient agreed to services and verbal consent obtained.  Engaged with patient  for by telephone forfollow up visit in response to referral for case management and/or care coordination services.   Patient is participating in a Managed Medicaid Plan:  Yes  Assessments/Interventions:  Review of past medical history, allergies, medications, health status, including review of consultants reports, laboratory and other test data, was performed as part of comprehensive evaluation and provision of chronic care management services.  SDOH: (Social Drivers of Health) assessments and interventions performed: SDOH Interventions    Flowsheet Row Patient Outreach Telephone from 03/24/2023 in [place] [facility] POPULATION [facility]ALTH DEPARTMENT Office Visit from 02/26/2023 in [facility] [place] - A Dept Of [place] General Par Office Visit from 10/29/2022 in [facility] - A Dept Of Eligha Bridegroom. [facility] Clinical Support from 10/17/2021 in [facility] Office Visit from 10/26/2020 in [facility] - A Dept Of Eligha Bridegroom. [facility]  SDOH Interventions       Food Insecurity Interventions -- Other (Comment)  [patient was approved for snap benefits] -- -- --  Housing Interventions -- AMB Referral -- -- --  Transportation Interventions -- Intervention Not Indicated -- Patient Resources (Friends/Family) --  Utilities Interventions -- Intervention Not Indicated -- -- --  Alcohol  Usage Interventions -- Intervention Not Indicated (Score <7) -- -- --  Depression Interventions/Treatment  -- -- Patient refuses Treatment -- Medication  Financial Strain Interventions -- Intervention Not Indicated -- -- --  Physical Activity Interventions -- Intervention Not Indicated -- -- --  Stress Interventions Provide Counseling, Community Resources Provided Other (Comment)  [amb referral] -- -- --  Social Connections Interventions -- Intervention Not Indicated -- -- --  Health Literacy Interventions -- Intervention Not Indicated -- -- --       Advanced Directives Status:  See Care Plan for related entries.  Care Plan                 Allergies  Allergen Reactions   Prilosec [Omeprazole]     Stiff neck    Medications Reviewed Today     Reviewed by Nina Bryant, LCSW (Social Worker) on 03/24/23 at 1501  Med List Status: <None>   Medication Order Taking? Sig Documenting Provider Last Dose Status Informant  albuterol (VENTOLIN HFA) 108 (90 Base) MCG/ACT inhaler [id number] No Inhale 1-2 puffs into the lungs every 6 (six) hours as needed for wheezing or shortness of breath.  Patient not taking: Reported on 02/26/2023   Hoy Register, MD Not Taking Active   chlorthalidone (HYGROTON) 25 MG tablet [id number]  Take 1 tablet (25 mg total) by mouth daily. Georgian Co M, PA-C  Active   escitalopram (LEXAPRO) 10 MG tablet [id number]  Take 1 tablet (10 mg total) by mouth daily. Georgian Co M, PA-C  Active   fluticasone [facility]) 50 MCG/ACT nasal spray [id number] No Place 2 sprays into both nostrils daily. Anders Simmonds, PA-C Taking Active  Med Note [facility], CARLY I   Thu Feb 26, 2023  8:52 AM) As needed   methocarbamol (ROBAXIN) 500 MG tablet [id number] No Take 1 tablet (500 mg total) by mouth every 8 (eight) hours as needed for muscle spasms.  Patient not taking: Reported on 02/26/2023   Maia Plan, MD Not Taking Active   tamsulosin [facility]) 0.4 MG CAPS capsule  [id number] No Take 1 capsule (0.4 mg total) by mouth daily.  Patient not taking: Reported on 02/26/2023   Maia Plan, MD Not Taking Active            Med Note [facility], Wenda Low I   Thu Feb 26, 2023  8:52 AM) As needed  Vitamin D, Ergocalciferol, (DRISDOL) 1.25 MG (50000 UNIT) CAPS capsule [id number]  Take 1 capsule (50,000 Units total) by mouth every 7 (seven) days. Anders Simmonds, [place]  Active             Patient Active Problem List   Diagnosis Date Noted   Prediabetes 03/22/2020   Dyslipidemia, goal LDL below 70 03/22/2020   HTN (hypertension) 05/14/2017   Post-operative state 03/17/2017   Back pain, thoracic 12/21/2014   Anemia 12/20/2014   Dyspnea 12/20/2014    Conditions to be addressed/monitored per PCP order:  Anxiety and Depression  Care Plan : LCSW Plan of Care  Updates made by Nina Bryant, LCSW since 03/24/2023 12:00 AM     Problem: Coping Skills (General Plan of Care)      Goal: Coping Skills Enhanced   Start Date: 03/06/2023  Note:   Timeframe:  Short-Range Goal Priority:  High Start Date:   03/06/23             Expected End Date:  ongoing                     Follow Up Date--04/08/23 at 1 pm  - keep 90 percent of scheduled appointments -consider counseling or psychiatry -consider bumping up your self-care  -consider creating a stronger support network   Why is this important?             Combating depression may take some time.            If you don't feel better right away, don't give up on your treatment plan.    Current barriers:   Chronic Mental Health needs related to symptoms of depression, stress and anxiety. Patient requires Support, Education, Resources, Referrals, Advocacy, and Care Coordination, in order to meet Unmet Mental Health Needs and to find a therapist and psychiatrist. Mental Health Concerns and Social Isolation Patient lacks knowledge of local and available community counseling agencies and resources.  Clinical Goal(s):  verbalize understanding of plan for management of Anxiety, Depression, and Stress symptoms and demonstrate a reduction in symptoms. Patient will connect with a provider for ongoing mental health treatment, increase coping skills, healthy habits, self-management skills, and stress reduction      Patient will implement clinical interventions discussed today to decrease symptoms of depression and increase knowledge and/or ability of: coping skills.    Clinical Interventions:  Assessed patient's previous and current treatment, coping skills, support system and barriers to care. Patient provided hx. Patient has very strong support system that includes her two daughters and sister.  Verbalization of feelings encouraged, motivational interviewing employed Emotional support provided, positive coping strategies explored. Establishing healthy boundaries emphasized and healthy self-care education provided Patient was educated on available mental health resources within their  area that accept Medicaid and offer counseling and psychiatry. Patient was advised to contact the back of her insurance card for assistance with benefits as well. Patient educated on the difference between therapy and psychiatry per patient request Email sent to patient today with available mental health resources within her area that accept Medicaid and offer the services that she is interested in. Email included instructions for scheduling at [facility] as well as some crisis support resources and [facility]'s walk in clinic hours. Patient will review resources over the next two-three weeks and make a decision regarding where she wishes to gain MH treatment at. Emotional support provided. CBT intervention implemented regarding "being mentally fit" by combating negative thinking and replacing it with uplifting support, hope and positivity. Patient reports that she was recently put on Lexapro to alleviate her symptoms. Education on activities to assist  with pain reduction (distraction, imagery, relaxation, massage, acupressure, TENS, heat, and cold application; Reviewed with patient prescribed pharmacological and nonpharmacological pain relief strategies; LCSW provided education on relaxation techniques such as meditation, deep breathing, massage, grounding exercises or yoga that can activate the body's relaxation response and ease symptoms of stress and anxiety. LCSW ask that when pt is struggling with difficult emotions and racing thoughts that they start this relaxation response process. LCSW provided extensive education on healthy coping skills for anxiety. SW used active and reflective listening, validated patient's feelings/concerns, and provided emotional support. Assessed social determinant of health barriers Patient will work on implementing appropriate self-care habits into their daily routine such as: staying positive, writing a gratitude list, drinking water, staying active around the house, taking their medications as prescribed, combating negative thoughts or emotions and staying connected with their family and friends. Positive reinforcement provided for this decision to work on this. Motivational Interviewing employed Depression screen reviewed  PHQ2/ PHQ9 completed or reviewed  Mindfulness or Relaxation training provided Active listening / Reflection utilized  Advance Care and HCPOA education provided Emotional Support Provided Problem Solving /Task Center strategies reviewed Provided psychoeducation for mental health needs  Provided brief CBT  Reviewed mental health medications and discussed importance of compliance:  Quality of sleep assessed & Sleep Hygiene techniques promoted  Participation in counseling encouraged  Verbalization of feelings encouraged  Suicidal Ideation/Homicidal Ideation assessed: Patient denies SI/HI  Review resources, discussed options and provided patient information about  Mental Health  Resources Inter-disciplinary care team collaboration (see longitudinal plan of care) [facility] LCSW successfully completed follow up call with patient today to re-assess [facility] needs. She shares that she is now ready for [facility] LCSW to place a referral for individual therapy. She has decided to stick with [facility] and gave verbal permission for [facility] LCSW to place referral to [facility]. Referral placed on 03/24/23. Patient is aware that [facility] LCSW will be going on short term leave for 12 weeks and will need ongoing follow up by a [facility] LCSW. [facility] LCSW will schedule a follow up for 30 days, but patient is aware that this appointment may be with another LCSW provider.  Patient Goals/Self-Care Activities: Take medications as prescribed   Attend all scheduled provider appointments Call pharmacy for medication refills 3-7 days in advance of running out of medications Perform all self care activities independently  Perform IADL's (shopping, preparing meals, housekeeping, managing finances) independently Call provider office for new concerns or questions Work with the social worker to address care coordination needs and will continue to work with the clinical team to address health care and disease management related needs  call 1-800-273-TALK (toll free, 24 hour hotline) If in a crisis, CALL 988 or go to [facility] [address] [place] [phone]) Utilize healthy coping skills and supportive resources discussed Contact PCP with any questions or concerns Keep 90 percent of counseling appointments Call your insurance provider for more information about your Enhanced Benefits  Check out counseling resources provided  Begin personal counseling with LCSW, to reduce and manage symptoms of Depression and Stress, until well-established with mental health provider Accept all calls from representative with Behavioral Health Providers in an effort to establish ongoing mental health counseling  and supportive services. Incorporate into daily practice - relaxation techniques, deep breathing exercises, and mindfulness meditation strategies. Talk about feelings with friends, family members, spiritual advisor, etc. Contact LCSW directly [phone]), if you have questions, need assistance, or if additional social work needs are identified between now and our next scheduled telephone outreach call. Call 988 for mental health hotline/crisis line if needed (24/7 available) Try techniques to reduce symptoms of anxiety/negative thinking (deep breathing, distraction, positive self talk, etc)  - develop a personal safety plan - exercise at least 2 to 3 times per week - have a plan for how to handle bad days - journal feelings and what helps to feel better or worse - spend time or talk with others at least 2 to 3 times per week - watch for early signs of feeling worse - begin personal counseling - call and visit an old friend - check out volunteer opportunities - join a support group - laugh; watch a funny movie or comedian - learn and use visualization or guided imagery - perform a random act of kindness - practice relaxation or meditation daily - start or continue a personal journal - practice positive thinking and self-talk -continue with compliance of taking medication  -identify current effective and ineffective coping strategies.  -implement positive self-talk in care to increase self-esteem, confidence and feelings of control.  -consider alternative and complementary therapy approaches such as meditation, mindfulness or yoga.  Call your insurance provider to gain education on benefits if desired Call your primary care doctor if symptoms get worse  -journaling, prayer, worship services, meditation or pastoral counseling.  -increase participation in pleasurable group activities such as hobbies, singing, sports or volunteering).  -consider the use of meditative movement therapy  such as tai chi or yoga  -start a regular daily exercise program based on tolerance, ability and patient choice to support positive thinking and activity    Follow Up Plan:  The patient has been provided with contact information for the care management team and has been advised to call with any mental health or health related questions or concerns.  The care management team will reach out to the patient again over the next 30 business  days.   If you are experiencing a Mental Health or Behavioral Health Crisis or need someone to talk to, please call the Suicide and Crisis Lifeline: 988       Follow up:  Patient agrees to Care Plan and Follow-up.  Plan: The Managed Medicaid care management team will reach out to the patient again over the next 30 days.  Dickie La, BSW, MSW, LCSW Licensed Clinical Social Worker [employer]   [email or web address] Direct Dial: [phone]

## 2023-03-24 NOTE — Patient Instructions (Signed)
 Visit Information  Nina Hopkins was given information about Medicaid Managed Care team care coordination services as a part of their [facility] Community Plan Medicaid benefit. Carrington Renee Rendleman verbally consented to engagement with the [facility] Managed Care team.   If you are experiencing a medical emergency, please call 911 or report to your local emergency department or urgent care.   If you have a non-emergency medical problem during routine business hours, please contact your provider's office and ask to speak with a nurse.   For questions related to your [facility], please call: [phone] or visit the homepage here: [email or web address]  If you would like to schedule transportation through your [facility], please call the following number at least 2 days in advance of your appointment: [phone]   Rides for urgent appointments can also be made after hours by calling Member Services.  Call the Behavioral Health Crisis Line at [phone], at any time, 24 hours a day, 7 days a week. If you are in danger or need immediate medical attention call 911.  If you would like help to quit smoking, call 1-800-QUIT-NOW (601 531 5326) OR Espaol: 1-855-Djelo-Ya (2-536-644-0347) o para ms informacin haga clic aqu or Text READY to 425-956 to register via text   Following is a copy of your plan of care:  Care Plan : LCSW Plan of Care  Updates made by Gustavus Bryant, LCSW since 03/24/2023 12:00 AM     Problem: Coping Skills (General Plan of Care)      Goal: Coping Skills Enhanced   Start Date: 03/06/2023  Note:   Timeframe:  Short-Range Goal Priority:  High Start Date:   03/06/23             Expected End Date:  ongoing                     Follow Up Date--04/08/23 at 1 pm  - keep 90 percent of scheduled appointments -consider counseling or psychiatry -consider  bumping up your self-care  -consider creating a stronger support network   Why is this important?             Combating depression may take some time.            If you don't feel better right away, don't give up on your treatment plan.    Current barriers:   Chronic Mental Health needs related to symptoms of depression, stress and anxiety. Patient requires Support, Education, Resources, Referrals, Advocacy, and Care Coordination, in order to meet Unmet Mental Health Needs and to find a therapist and psychiatrist. Mental Health Concerns and Social Isolation Patient lacks knowledge of local and available community counseling agencies and resources.  Clinical Goal(s): verbalize understanding of plan for management of Anxiety, Depression, and Stress symptoms and demonstrate a reduction in symptoms. Patient will connect with a provider for ongoing mental health treatment, increase coping skills, healthy habits, self-management skills, and stress reduction      Patient will implement clinical interventions discussed today to decrease symptoms of depression and increase knowledge and/or ability of: coping skills.  Patient Goals/Self-Care Activities: Take medications as prescribed   Attend all scheduled provider appointments Call pharmacy for medication refills 3-7 days in advance of running out of medications Perform all self care activities independently  Perform IADL's (shopping, preparing meals, housekeeping, managing finances) independently Call provider office for new concerns or questions Work with the social worker to address care coordination needs  and will continue to work with the clinical team to address health care and disease management related needs call 1-800-273-TALK (toll free, 24 hour hotline) If in a crisis, CALL 988 or go to [facility] [address], [place] [phone]) Utilize healthy coping skills and supportive resources  discussed Contact PCP with any questions or concerns Keep 90 percent of counseling appointments Call your insurance provider for more information about your Enhanced Benefits  Check out counseling resources provided  Begin personal counseling with LCSW, to reduce and manage symptoms of Depression and Stress, until well-established with mental health provider Accept all calls from representative with Behavioral Health Providers in an effort to establish ongoing mental health counseling and supportive services. Incorporate into daily practice - relaxation techniques, deep breathing exercises, and mindfulness meditation strategies. Talk about feelings with friends, family members, spiritual advisor, etc. Contact LCSW directly [phone]), if you have questions, need assistance, or if additional social work needs are identified between now and our next scheduled telephone outreach call. Call 988 for mental health hotline/crisis line if needed (24/7 available) Try techniques to reduce symptoms of anxiety/negative thinking (deep breathing, distraction, positive self talk, etc)  - develop a personal safety plan - exercise at least 2 to 3 times per week - have a plan for how to handle bad days - journal feelings and what helps to feel better or worse - spend time or talk with others at least 2 to 3 times per week - watch for early signs of feeling worse - begin personal counseling - call and visit an old friend - check out volunteer opportunities - join a support group - laugh; watch a funny movie or comedian - learn and use visualization or guided imagery - perform a random act of kindness - practice relaxation or meditation daily - start or continue a personal journal - practice positive thinking and self-talk -continue with compliance of taking medication  -identify current effective and ineffective coping strategies.  -implement positive self-talk in care to increase self-esteem,  confidence and feelings of control.  -consider alternative and complementary therapy approaches such as meditation, mindfulness or yoga.  Call your insurance provider to gain education on benefits if desired Call your primary care doctor if symptoms get worse  -journaling, prayer, worship services, meditation or pastoral counseling.  -increase participation in pleasurable group activities such as hobbies, singing, sports or volunteering).  -consider the use of meditative movement therapy such as tai chi or yoga  -start a regular daily exercise program based on tolerance, ability and patient choice to support positive thinking and activity    Follow Up Plan:  The patient has been provided with contact information for the care management team and has been advised to call with any mental health or health related questions or concerns.  The care management team will reach out to the patient again over the next 30 business  days.   If you are experiencing a Mental Health or Behavioral Health Crisis or need someone to talk to, please call the Suicide and Crisis Lifeline: 988      24- Hour Availability:    [facility]  [address] [place], [place] [phone] Crisis [phone]   [facility] [phone]  [facility]  [phone]    [facility]  [phone] (after hours)   Therapeutic Alternative/Mobile Crisis   [phone]   [place] National Suicide Hotline  870-683-6003 (  TALK) OR 988   Call 988 for mental health emergencies   [facility]  [phone]);  [place] and [employer]  [phone]); [place], [place], [place], [place], [place], [place], [place]    [facility] Res[facility] For 24/7 walk-up access to mental health services for [facility] children (4+), adolescents and adults, please visit the  [facility] located at [address] in [place], [place].  *[facility] also provides comprehensive outpatient behavioral health services in a variety of locations around the [place].  Connect With [place] [address] [place], [place] [address] HelpLine: 4056229788 or 1-4178341137  Get Directions  Find Help 24/7 By Phone Call our 24-hour HelpLine at (430) 617-7605 or 820 375 4646 for immediate assistance for mental health and substance abuse issues.  Walk-In Help [place]: [facility] (Ages 4 and Up) [place]: Emergency Dept., [facility] Additional Resources National Hopeline Network: 1-800-SUICIDE The National Suicide Prevention Lifeline: 1-800-273-TALK     The following coping skill education was provided for stress relief and mental health management: "When your car dies or a deadline looms, how do you respond? Long-term, low-grade or acute stress takes a serious toll on your body and mind, so don't ignore feelings of constant tension. Stress is a natural part of life. However, too much stress can harm our health, especially if it continues every day. This is chronic stress and can put you at risk for heart problems like heart disease and depression. Understand what's happening inside your body and learn simple coping skills to combat the negative impacts of everyday stressors.  Types of Stress There are two types of stress: Emotional - types of emotional stress are relationship problems, pressure at work, financial worries, experiencing discrimination or having a major life change. Physical - Examples of physical stress include being sick having pain, not sleeping well, recovery from an injury or having an alcohol and drug use disorder. Fight or Flight Sudden or ongoing stress activates your nervous system and floods your bloodstream with adrenaline and cortisol, two hormones that raise  blood pressure, increase heart rate and spike blood sugar. These changes pitch your body into a fight or flight response. That enabled our ancestors to outrun saber-toothed tigers, and it's helpful today for situations like dodging a car accident. But most modern chronic stressors, such as finances or a challenging relationship, keep your body in that heightened state, which hurts your health. Effects of Too Much Stress If constantly under stress, most of [place] will eventually start to function less well.  Multiple studies link chronic stress to a higher risk of heart disease, stroke, depression, weight gain, memory loss and even premature death, so it's important to recognize the warning signals. Talk to your doctor about ways to manage stress if you're experiencing any of these symptoms: Prolonged periods of poor sleep. Regular, severe headaches. Unexplained weight loss or gain. Feelings of isolation, withdrawal or worthlessness. Constant anger and irritability. Loss of interest in activities. Constant worrying or obsessive thinking. Excessive alcohol or drug use. Inability to concentrate.  10 Ways to Cope with Chronic Stress It's key to recognize stressful situations as they occur because it allows you to focus on managing how you react. We all need to know when to close our eyes and take a deep breath when we feel tension rising. Use these tips to prevent or reduce chronic stress. 1. Rebalance Work and Home All work and  no play? If you're spending too much time at the office, intentionally put more dates in your calendar to enjoy time for fun, either alone or with others. 2. Get Regular Exercise Moving your body on a regular basis balances the nervous system and increases blood circulation, helping to flush out stress hormones. Even a daily 20-minute walk makes a difference. Any kind of exercise can lower stress and improve your mood ? just pick activities that you enjoy and make it a regular  habit. 3. Eat Well and Limit Alcohol and Stimulants Alcohol, nicotine and caffeine may temporarily relieve stress but have negative health impacts and can make stress worse in the long run. Well-nourished bodies cope better, so start with a good breakfast, add more organic fruits and vegetables for a well-balanced diet, avoid processed foods and sugar, try herbal tea and drink more water. 4. Connect with Supportive People Talking face to face with another person releases hormones that reduce stress. Lean on those good listeners in your life. 5. Carve Out Hobby Time Do you enjoy gardening, reading, listening to music or some other creative pursuit? Engage in activities that bring you pleasure and joy; research shows that reduces stress by almost half and lowers your heart rate, too. 6. Practice Meditation, Stress Reduction or Yoga Relaxation techniques activate a state of restfulness that counterbalances your body's fight-or-flight hormones. Even if this also means a 10-minute break in a long day: listen to music, read, go for a walk in nature, do a hobby, take a bath or spend time with a friend. Also consider doing a mindfulness exercise or try a daily deep breathing or imagery practice. Deep Breathing Slow, calm and deep breathing can help you relax. Try these steps to focus on your breathing and repeat as needed. Find a comfortable position and close your eyes. Exhale and drop your shoulders. Breathe in through your nose; fill your lungs and then your belly. Think of relaxing your body, quieting your mind and becoming calm and peaceful. Breathe out slowly through your nose, relaxing your belly. Think of releasing tension, pain, worries or distress. Repeat steps three and four until you feel relaxed. Imagery This involves using your mind to excite the senses -- sound, vision, smell, taste and feeling. This may help ease your stress. Begin by getting comfortable and then do some slow  breathing. Imagine a place you love being at. It could be somewhere from your childhood, somewhere you vacationed or just a place in your imagination. Feel how it is to be in the place you're imagining. Pay attention to the sounds, air, colors, and who is there with you. This is a place where you feel cared for and loved. All is well. You are safe. Take in all the smells, sounds, tastes and feelings. As you do, feel your body being nourished and healed. Feel the calm that surrounds you. Breathe in all the good. Breathe out any discomfort or tension. 7. Sleep Enough If you get less than seven to eight hours of sleep, your body won't tolerate stress as well as it could. If stress keeps you up at night, address the cause, and add extra meditation into your day to make up for the lost z's. Try to get seven to nine hours of sleep each night. Make a regular bedtime schedule. Keep your room dark and cool. Try to avoid computers, TV, cell phones and tablets before bed. 8. Bond with Connections You Enjoy Go out for a coffee with a friend,  chat with a neighbor, call a family member, visit with a clergy member, or even hang out with your pet. Clinical studies show that spending even a short time with a companion animal can cut anxiety levels almost in half. 9. Take a Vacation Getting away from it all can reset your stress tolerance by increasing your mental and emotional outlook, which makes you a happier, more productive person upon return. Leave your cellphone and laptop at home! 10. See a Counselor, Coach or Therapist If negative thoughts overwhelm your ability to make positive changes, it's time to seek professional help. Make an appointment today--your health and life are worth it."  Dickie La, BSW, MSW, LCSW Licensed Clinical Social Worker [employer]   [facility] [email or web address] Direct Dial: [phone]

## 2023-04-03 ENCOUNTER — Other Ambulatory Visit: Payer: Self-pay

## 2023-04-07 ENCOUNTER — Encounter: Payer: Self-pay | Admitting: Physician Assistant

## 2023-04-07 ENCOUNTER — Ambulatory Visit: Payer: Medicaid Other | Admitting: Physician Assistant

## 2023-04-07 ENCOUNTER — Other Ambulatory Visit: Payer: Self-pay | Admitting: Licensed Clinical Social Worker

## 2023-04-07 ENCOUNTER — Other Ambulatory Visit: Payer: Self-pay

## 2023-04-07 ENCOUNTER — Ambulatory Visit (INDEPENDENT_AMBULATORY_CARE_PROVIDER_SITE_OTHER): Payer: Self-pay

## 2023-04-07 DIAGNOSIS — M25371 Other instability, right ankle: Secondary | ICD-10-CM | POA: Diagnosis not present

## 2023-04-07 DIAGNOSIS — M25571 Pain in right ankle and joints of right foot: Secondary | ICD-10-CM | POA: Diagnosis not present

## 2023-04-07 NOTE — Patient Outreach (Signed)
 Medicaid Managed Care Social Work Note  04/07/2023 Name:  Nina Hopkins MRN:  [id number] DOB:  1960-02-24  Nina Hopkins is an 63 y.o. year old female who is a primary patient of Claiborne Rigg, NP.  The Medicaid Managed Care Coordination team was consulted for assistance with:  Mental Health Counseling and Resources  [name] was given information about Medicaid Managed Care Coordination team services today. Nina Hopkins Patient agreed to services and verbal consent obtained.  Engaged with patient  for by telephone forfollow up visit in response to referral for case management and/or care coordination services.   Patient is participating in a Managed Medicaid Plan:  Yes  Assessments/Interventions:  Review of past medical history, allergies, medications, health status, including review of consultants reports, laboratory and other test data, was performed as part of comprehensive evaluation and provision of chronic care management services.  SDOH: (Social Drivers of Health) assessments and interventions performed: SDOH Interventions    Flowsheet Row Patient Outreach Telephone from 03/24/2023 in [facility] POPULATION [facility]ALTH DEPARTMENT Office Visit from 02/26/2023 in [facility] [place] - A Dept Of [place]. [facility] Office Visit from 10/29/2022 in [facility] - A Dept Of Eligha Bridegroom. [facility] Clinical Support from 10/17/2021 in [facility] Office Visit from 10/26/2020 in [facility] - A Dept Of Eligha Bridegroom. [facility]  SDOH Interventions       Food Insecurity Interventions -- Other (Comment)  [patient was approved for snap benefits] -- -- --  Housing Interventions -- AMB Referral -- -- --  Transportation Interventions -- Intervention Not Indicated -- Patient Resources (Friends/Family) --  Utilities Interventions -- Intervention Not Indicated -- -- --  Alcohol  Usage Interventions -- Intervention Not Indicated (Score <7) -- -- --  Depression Interventions/Treatment  -- -- Patient refuses Treatment -- Medication  Financial Strain Interventions -- Intervention Not Indicated -- -- --  Physical Activity Interventions -- Intervention Not Indicated -- -- --  Stress Interventions Provide Counseling, Community Resources Provided Other (Comment)  [amb referral] -- -- --  Social Connections Interventions -- Intervention Not Indicated -- -- --  Health Literacy Interventions -- Intervention Not Indicated -- -- --       Advanced Directives Status:  See Care Plan for related entries.  Care Plan                 Allergies  Allergen Reactions   Prilosec [Omeprazole]     Stiff neck    Medications Reviewed Today     Reviewed by Gustavus Bryant, LCSW (Social Worker) on 04/07/23 at 1543  Med List Status: <None>   Medication Order Taking? Sig Documenting Provider Last Dose Status Informant  albuterol (VENTOLIN HFA) 108 (90 Base) MCG/ACT inhaler [id number] No Inhale 1-2 puffs into the lungs every 6 (six) hours as needed for wheezing or shortness of breath.  Patient not taking: Reported on 02/26/2023   Hoy Register, MD Not Taking Active   chlorthalidone (HYGROTON) 25 MG tablet [id number]  Take 1 tablet (25 mg total) by mouth daily. Georgian Co M, PA-C  Active   escitalopram (LEXAPRO) 10 MG tablet [id number]  Take 1 tablet (10 mg total) by mouth daily. Georgian Co M, PA-C  Active   fluticasone [facility]) 50 MCG/ACT nasal spray [id number] No Place 2 sprays into both nostrils daily. Anders Simmonds, PA-C Taking Active  Med Note [facility], CARLY I   Thu Feb 26, 2023  8:52 AM) As needed   methocarbamol (ROBAXIN) 500 MG tablet [id number] No Take 1 tablet (500 mg total) by mouth every 8 (eight) hours as needed for muscle spasms.  Patient not taking: Reported on 02/26/2023   Maia Plan, MD Not Taking Active   tamsulosin [facility]) 0.4 MG CAPS capsule  [id number] No Take 1 capsule (0.4 mg total) by mouth daily.  Patient not taking: Reported on 02/26/2023   Maia Plan, MD Not Taking Active            Med Note [facility], Wenda Low I   Thu Feb 26, 2023  8:52 AM) As needed  Vitamin D, Ergocalciferol, (DRISDOL) 1.25 MG (50000 UNIT) CAPS capsule [id number]  Take 1 capsule (50,000 Units total) by mouth every 7 (seven) days. Anders Simmonds, [place]  Active             Patient Active Problem List   Diagnosis Date Noted   Prediabetes 03/22/2020   Dyslipidemia, goal LDL below 70 03/22/2020   HTN (hypertension) 05/14/2017   Post-operative state 03/17/2017   Back pain, thoracic 12/21/2014   Anemia 12/20/2014   Dyspnea 12/20/2014    Conditions to be addressed/monitored per PCP order:   Stress Management  Care Plan : LCSW Plan of Care  Updates made by Gustavus Bryant, LCSW since 04/07/2023 12:00 AM     Problem: Coping Skills (General Plan of Care)      Goal: Coping Skills Enhanced   Start Date: 03/06/2023  Note:   Timeframe:  Short-Range Goal Priority:  High Start Date:   03/06/23             Expected End Date:  ongoing                     Follow Up Date--Goal met and ended on 04/07/23.  - keep 90 percent of scheduled appointments -consider counseling or psychiatry -consider bumping up your self-care  -consider creating a stronger support network   Why is this important?             Combating depression may take some time.            If you don't feel better right away, don't give up on your treatment plan.    Current barriers:   Chronic Mental Health needs related to symptoms of depression, stress and anxiety. Patient requires Support, Education, Resources, Referrals, Advocacy, and Care Coordination, in order to meet Unmet Mental Health Needs and to find a therapist and psychiatrist. Mental Health Concerns and Social Isolation Patient lacks knowledge of local and available community counseling agencies and resources.  Clinical  Goal(s): verbalize understanding of plan for management of Anxiety, Depression, and Stress symptoms and demonstrate a reduction in symptoms. Patient will connect with a provider for ongoing mental health treatment, increase coping skills, healthy habits, self-management skills, and stress reduction      Patient will implement clinical interventions discussed today to decrease symptoms of depression and increase knowledge and/or ability of: coping skills.    Clinical Interventions:  Assessed patient's previous and current treatment, coping skills, support system and barriers to care. Patient provided hx. Patient has very strong support system that includes her two daughters and sister.  Verbalization of feelings encouraged, motivational interviewing employed Emotional support provided, positive coping strategies explored. Establishing healthy boundaries emphasized and healthy self-care education provided Patient was educated on available mental health resources  within their area that accept Medicaid and offer counseling and psychiatry. Patient was advised to contact the back of her insurance card for assistance with benefits as well. Patient educated on the difference between therapy and psychiatry per patient request Email sent to patient today with available mental health resources within her area that accept Medicaid and offer the services that she is interested in. Email included instructions for scheduling at [facility] as well as some crisis support resources and [facility]'s walk in clinic hours. Patient will review resources over the next two-three weeks and make a decision regarding where she wishes to gain MH treatment at. Emotional support provided. CBT intervention implemented regarding "being mentally fit" by combating negative thinking and replacing it with uplifting support, hope and positivity. Patient reports that she was recently put on Lexapro to alleviate her symptoms. Education on activities  to assist with pain reduction (distraction, imagery, relaxation, massage, acupressure, TENS, heat, and cold application; Reviewed with patient prescribed pharmacological and nonpharmacological pain relief strategies; LCSW provided education on relaxation techniques such as meditation, deep breathing, massage, grounding exercises or yoga that can activate the body's relaxation response and ease symptoms of stress and anxiety. LCSW ask that when pt is struggling with difficult emotions and racing thoughts that they start this relaxation response process. LCSW provided extensive education on healthy coping skills for anxiety. SW used active and reflective listening, validated patient's feelings/concerns, and provided emotional support. Assessed social determinant of health barriers Patient will work on implementing appropriate self-care habits into their daily routine such as: staying positive, writing a gratitude list, drinking water, staying active around the house, taking their medications as prescribed, combating negative thoughts or emotions and staying connected with their family and friends. Positive reinforcement provided for this decision to work on this. Motivational Interviewing employed Depression screen reviewed  PHQ2/ PHQ9 completed or reviewed  Mindfulness or Relaxation training provided Active listening / Reflection utilized  Advance Care and HCPOA education provided Emotional Support Provided Problem Solving /Task Center strategies reviewed Provided psychoeducation for mental health needs  Provided brief CBT  Reviewed mental health medications and discussed importance of compliance:  Quality of sleep assessed & Sleep Hygiene techniques promoted  Participation in counseling encouraged  Verbalization of feelings encouraged  Suicidal Ideation/Homicidal Ideation assessed: Patient denies SI/HI  Review resources, discussed options and provided patient information about  Mental Health  Resources Inter-disciplinary care team collaboration (see longitudinal plan of care) [facility] LCSW successfully completed follow up call with patient today to re-assess [facility] needs. She shares that she is now ready for [facility] LCSW to place a referral for individual therapy. She has decided to stick with [facility] and gave verbal permission for [facility] LCSW to place referral to [facility]. Referral placed on 03/24/23. Patient is aware that [facility] LCSW will be going on short term leave for 12 weeks and will need ongoing follow up by a [facility] LCSW. [facility] LCSW will schedule a follow up for 30 days, but patient is aware that this appointment may be with another LCSW provider. Update- Patient was successfully scheduled for therapy with [facility] for May of 2025. She reports having no urgent needs and is fine with waiting for this appointment. However, pt was educated on their walk in clinic and advised to consider getting on their wait list for cancellations. Patient denies any further social work needs and is agreeable to case closure at this time.   Patient Goals/Self-Care Activities: Take medications as prescribed   Attend all scheduled provider appointments  Call pharmacy for medication refills 3-7 days in advance of running out of medications Perform all self care activities independently  Perform IADL's (shopping, preparing meals, housekeeping, managing finances) independently Call provider office for new concerns or questions Work with the social worker to address care coordination needs and will continue to work with the clinical team to address health care and disease management related needs call 1-800-273-TALK (toll free, 24 hour hotline) If in a crisis, CALL 988 or go to [facility] [address], [place] [phone]) Utilize healthy coping skills and supportive resources discussed Contact PCP with any questions or concerns Keep 90 percent of counseling appointments Call your insurance  provider for more information about your Enhanced Benefits  Check out counseling resources provided  Begin personal counseling with LCSW, to reduce and manage symptoms of Depression and Stress, until well-established with mental health provider Accept all calls from representative with Behavioral Health Providers in an effort to establish ongoing mental health counseling and supportive services. Incorporate into daily practice - relaxation techniques, deep breathing exercises, and mindfulness meditation strategies. Talk about feelings with friends, family members, spiritual advisor, etc. Contact LCSW directly [phone]), if you have questions, need assistance, or if additional social work needs are identified between now and our next scheduled telephone outreach call. Call 988 for mental health hotline/crisis line if needed (24/7 available) Try techniques to reduce symptoms of anxiety/negative thinking (deep breathing, distraction, positive self talk, etc)  - develop a personal safety plan - exercise at least 2 to 3 times per week - have a plan for how to handle bad days - journal feelings and what helps to feel better or worse - spend time or talk with others at least 2 to 3 times per week - watch for early signs of feeling worse - begin personal counseling - call and visit an old friend - check out volunteer opportunities - join a support group - laugh; watch a funny movie or comedian - learn and use visualization or guided imagery - perform a random act of kindness - practice relaxation or meditation daily - start or continue a personal journal - practice positive thinking and self-talk -continue with compliance of taking medication  -identify current effective and ineffective coping strategies.  -implement positive self-talk in care to increase self-esteem, confidence and feelings of control.  -consider alternative and complementary therapy approaches such as meditation,  mindfulness or yoga.  Call your insurance provider to gain education on benefits if desired Call your primary care doctor if symptoms get worse  -journaling, prayer, worship services, meditation or pastoral counseling.  -increase participation in pleasurable group activities such as hobbies, singing, sports or volunteering).  -consider the use of meditative movement therapy such as tai chi or yoga  -start a regular daily exercise program based on tolerance, ability and patient choice to support positive thinking and activity    Follow Up Plan:  The patient has been provided with contact information for the care management team and has been advised to call with any mental health or health related questions or concerns.  The care management team will reach out to the patient again over the next 30 business  days.   If you are experiencing a Mental Health or Behavioral Health Crisis or need someone to talk to, please call the Suicide and Crisis Lifeline: 988        Follow up:  Patient requests no follow-up at this time.  Plan: The Managed Medicaid care  management team is available to follow up with the patient after provider conversation with the patient regarding recommendation for care management engagement and subsequent re-referral to the care management team.   Dickie La, BSW, MSW, LCSW Licensed Clinical Social Worker [place]   [email or web address] Direct Dial: [phone]

## 2023-04-07 NOTE — Progress Notes (Signed)
 Office Visit Note   Patient: Nina Hopkins           Date of Birth: 1960-03-19           MRN: [id number] Visit Date: 04/07/2023              Requested by: Anders Simmonds, PA-C [address] [place],  [place] [address] PCP: Claiborne Rigg, NP   Assessment & Plan: Visit Diagnoses:  1. Pain in right ankle and joints of right foot     Plan: Patient presents today with a chief complaint of right ankle instability.  She denies any injury.  She states that sometimes she is walking and her ankle gives way.  Denies any radicular findings denies any of her leg giving way.  She also complains of instability in other joints including her wrist and her left ankle and knees.  Ankle exam was benign today this is her most concerning issue.  I recommended use of a brace and PT she cannot do PT right now.  She is got good strength.  Did give her some home exercises and an ASO brace.  I did tell her that she has no history of connective tissue disease or connective tissue disease in her family.  Would be unusual to get this at 89 however this is out of my area of expertise would have her speak with her primary care to see if she would needs a workup with perhaps with a rheumatologist  Follow-Up Instructions: Return if symptoms worsen or fail to improve.   Orders:  Orders Placed This Encounter  Procedures   XR Ankle Complete Right   No orders of the defined types were placed in this encounter.     Procedures: No procedures performed   Clinical Data: No additional findings.   Subjective: Chief Complaint  Patient presents with   Right Ankle - Pain    HPI patient is a 63 year old woman presents today with right ankle instability.  She regularly feels like it is going to give out.  Going on for about 6 months no known injury is not constant but regular enough that she is afraid she is going to fall   Review of Systems  All other systems reviewed and are  negative.    Objective: Vital Signs: LMP 03/02/2017 (Approximate)   Physical Exam Constitutional:      Appearance: Normal appearance.  Pulmonary:     Effort: Pulmonary effort is normal.  Skin:    General: Skin is warm and dry.  Neurological:     General: No focal deficit present.     Mental Status: She is alert and oriented to person, place, and time.     Ortho Exam Examination of her ankle shows no redness no erythema compartments are soft and compressible she has good strength with dorsiflexion plantarflexion eversion inversion has some generalized pain with plantar flexion over the ankle joint itself.  Fairly good endpoint on anterior draw no tenderness over the lateral ligaments no tenderness over the midfoot Specialty Comments:  No specialty comments available.  Imaging: XR Ankle Complete Right Result Date: 04/07/2023 This of her right ankle demonstrate no evidence of fracture well-maintained alignment no significant degenerative changes she does have spurring at both the plantar and posterior aspect of the calcaneus    PMFS History: Patient Active Problem List   Diagnosis Date Noted   Prediabetes 03/22/2020   Dyslipidemia, goal LDL below 70 03/22/2020   HTN (hypertension)  05/14/2017   Post-operative state 03/17/2017   Back pain, thoracic 12/21/2014   Anemia 12/20/2014   Dyspnea 12/20/2014   Past Medical History:  Diagnosis Date   Anemia    Anxiety ~ 2008   no meds   Dyspnea    hx - r/t anemia per patient   Family history of adverse reaction to anesthesia    "my sister had a hard time waking up"   History of blood transfusion 12/20/2014   related to anemia   Hyperlipidemia    Hypertension    Missed ab    x 1 - no surgery required   SVD (spontaneous vaginal delivery)    x 2    Family History  Problem Relation Age of Onset   Cancer Mother        BRAIN TUMOR, VULVAR   Hypertension Mother    Diabetes Mother    Cancer Father        PENILE   Cancer  Maternal Grandmother        LUNG   Cancer Maternal Grandfather        COLON   Diabetes Sister    Hypertension Sister    Heart disease Sister     Past Surgical History:  Procedure Laterality Date   ABDOMINAL HYSTERECTOMY N/A 03/17/2017   Procedure: HYSTERECTOMY ABDOMINAL;  Surgeon: Allie Bossier, MD;  Location: [facility];  Service: Gynecology;  Laterality: N/A;   CERVIX LESION DESTRUCTION     cryro   CYSTOSCOPY N/A 03/17/2017   Procedure: CYSTOSCOPY;  Surgeon: Allie Bossier, MD;  Location: [facility];  Service: Gynecology;  Laterality: N/A;   DILATION AND CURETTAGE, DIAGNOSTIC / THERAPEUTIC      in office   SALPINGOOPHORECTOMY Bilateral 03/17/2017   Procedure: SALPINGO OOPHORECTOMY;  Surgeon: Allie Bossier, MD;  Location: [facility];  Service: Gynecology;  Laterality: Bilateral;   TUBAL LIGATION  1990's   Social History   Occupational History   Not on file  Tobacco Use   Smoking status: Never   Smokeless tobacco: Never  Vaping Use   Vaping status: Never Used  Substance and Sexual Activity   Alcohol use: Not Currently    Comment: Wine   Drug use: No   Sexual activity: Not Currently    Birth control/protection: None

## 2023-04-07 NOTE — Patient Instructions (Signed)
 Visit Information  Nina Hopkins was given information about Medicaid Managed Care team care coordination services as a part of their [facility] Community Plan Medicaid benefit. Nina Hopkins verbally consented to engagement with the [facility] Managed Care team.   If you are experiencing a medical emergency, please call 911 or report to your local emergency department or urgent care.   If you have a non-emergency medical problem during routine business hours, please contact your provider's office and ask to speak with a nurse.   For questions related to your [facility], please call: [phone] or visit the homepage here: [email or web address]  If you would like to schedule transportation through your [facility], please call the following number at least 2 days in advance of your appointment: [phone]   Rides for urgent appointments can also be made after hours by calling Member Services.  Call the Behavioral Health Crisis Line at [phone], at any time, 24 hours a day, 7 days a week. If you are in danger or need immediate medical attention call 911.  If you would like help to quit smoking, call 1-800-QUIT-NOW (906-139-3313) OR Espaol: 1-855-Djelo-Ya (1-324-401-0272) o para ms informacin haga clic aqu or Text READY to 536-644 to register via text   Following is a copy of your plan of care:  Care Plan : LCSW Plan of Care  Updates made by Gustavus Bryant, LCSW since 04/07/2023 12:00 AM     Problem: Coping Skills (General Plan of Care)      Goal: Coping Skills Enhanced   Start Date: 03/06/2023  Note:   Timeframe:  Short-Range Goal Priority:  High Start Date:   03/06/23             Expected End Date:  ongoing                     Follow Up Date--Goal met and ended on 04/07/23.  - keep 90 percent of scheduled appointments -consider counseling or  psychiatry -consider bumping up your self-care  -consider creating a stronger support network   Why is this important?             Combating depression may take some time.            If you don't feel better right away, don't give up on your treatment plan.    Current barriers:   Chronic Mental Health needs related to symptoms of depression, stress and anxiety. Patient requires Support, Education, Resources, Referrals, Advocacy, and Care Coordination, in order to meet Unmet Mental Health Needs and to find a therapist and psychiatrist. Mental Health Concerns and Social Isolation Patient lacks knowledge of local and available community counseling agencies and resources.  Clinical Goal(s): verbalize understanding of plan for management of Anxiety, Depression, and Stress symptoms and demonstrate a reduction in symptoms. Patient will connect with a provider for ongoing mental health treatment, increase coping skills, healthy habits, self-management skills, and stress reduction      Patient will implement clinical interventions discussed today to decrease symptoms of depression and increase knowledge and/or ability of: coping skills.  Patient Goals/Self-Care Activities: Take medications as prescribed   Attend all scheduled provider appointments Call pharmacy for medication refills 3-7 days in advance of running out of medications Perform all self care activities independently  Perform IADL's (shopping, preparing meals, housekeeping, managing finances) independently Call provider office for new concerns or questions Work with the social worker to address care  coordination needs and will continue to work with the clinical team to address health care and disease management related needs call 1-800-273-TALK (toll free, 24 hour hotline) If in a crisis, CALL 988 or go to [facility] [address], [place] [phone]) Utilize healthy coping skills and  supportive resources discussed Contact PCP with any questions or concerns Keep 90 percent of counseling appointments Call your insurance provider for more information about your Enhanced Benefits  Check out counseling resources provided  Begin personal counseling with LCSW, to reduce and manage symptoms of Depression and Stress, until well-established with mental health provider Accept all calls from representative with Behavioral Health Providers in an effort to establish ongoing mental health counseling and supportive services. Incorporate into daily practice - relaxation techniques, deep breathing exercises, and mindfulness meditation strategies. Talk about feelings with friends, family members, spiritual advisor, etc. Contact LCSW directly [phone]), if you have questions, need assistance, or if additional social work needs are identified between now and our next scheduled telephone outreach call. Call 988 for mental health hotline/crisis line if needed (24/7 available) Try techniques to reduce symptoms of anxiety/negative thinking (deep breathing, distraction, positive self talk, etc)  - develop a personal safety plan - exercise at least 2 to 3 times per week - have a plan for how to handle bad days - journal feelings and what helps to feel better or worse - spend time or talk with others at least 2 to 3 times per week - watch for early signs of feeling worse - begin personal counseling - call and visit an old friend - check out volunteer opportunities - join a support group - laugh; watch a funny movie or comedian - learn and use visualization or guided imagery - perform a random act of kindness - practice relaxation or meditation daily - start or continue a personal journal - practice positive thinking and self-talk -continue with compliance of taking medication  -identify current effective and ineffective coping strategies.  -implement positive self-talk in care to  increase self-esteem, confidence and feelings of control.  -consider alternative and complementary therapy approaches such as meditation, mindfulness or yoga.  Call your insurance provider to gain education on benefits if desired Call your primary care doctor if symptoms get worse  -journaling, prayer, worship services, meditation or pastoral counseling.  -increase participation in pleasurable group activities such as hobbies, singing, sports or volunteering).  -consider the use of meditative movement therapy such as tai chi or yoga  -start a regular daily exercise program based on tolerance, ability and patient choice to support positive thinking and activity    Follow Up Plan:  The patient has been provided with contact information for the care management team and has been advised to call with any mental health or health related questions or concerns.  The care management team will reach out to the patient again over the next 30 business  days.   If you are experiencing a Mental Health or Behavioral Health Crisis or need someone to talk to, please call the Suicide and Crisis Lifeline: 73   Dickie La, BSW, MSW, [employer] Licensed Visual merchandiser [employer]   [email or web address] Direct Dial: [phone]

## 2023-04-08 ENCOUNTER — Ambulatory Visit: Payer: Medicaid Other | Admitting: Licensed Clinical Social Worker

## 2023-04-10 ENCOUNTER — Ambulatory Visit: Payer: Medicaid Other | Admitting: Nurse Practitioner

## 2023-04-23 ENCOUNTER — Telehealth: Payer: Self-pay | Admitting: Nurse Practitioner

## 2023-04-23 NOTE — Telephone Encounter (Signed)
 Called to confirm appt. Pt will be present

## 2023-04-28 ENCOUNTER — Other Ambulatory Visit: Payer: Self-pay

## 2023-04-28 ENCOUNTER — Ambulatory Visit: Payer: Self-pay | Attending: Nurse Practitioner | Admitting: Nurse Practitioner

## 2023-04-28 ENCOUNTER — Encounter: Payer: Self-pay | Admitting: Nurse Practitioner

## 2023-04-28 VITALS — BP 130/84 | HR 74 | Resp 19 | Ht 61.0 in | Wt 215.6 lb

## 2023-04-28 DIAGNOSIS — M359 Systemic involvement of connective tissue, unspecified: Secondary | ICD-10-CM

## 2023-04-28 DIAGNOSIS — Z1211 Encounter for screening for malignant neoplasm of colon: Secondary | ICD-10-CM | POA: Diagnosis not present

## 2023-04-28 DIAGNOSIS — M7918 Myalgia, other site: Secondary | ICD-10-CM | POA: Diagnosis not present

## 2023-04-28 DIAGNOSIS — M2550 Pain in unspecified joint: Secondary | ICD-10-CM | POA: Diagnosis not present

## 2023-04-28 DIAGNOSIS — I10 Essential (primary) hypertension: Secondary | ICD-10-CM

## 2023-04-28 DIAGNOSIS — R7303 Prediabetes: Secondary | ICD-10-CM

## 2023-04-28 LAB — POCT GLYCOSYLATED HEMOGLOBIN (HGB A1C): HbA1c, POC (prediabetic range): 5.9 % (ref 5.7–6.4)

## 2023-04-28 MED ORDER — CHLORTHALIDONE 25 MG PO TABS
25.0000 mg | ORAL_TABLET | Freq: Every day | ORAL | 1 refills | Status: DC
  Filled 2023-04-28 – 2023-07-01 (×2): qty 90, 90d supply, fill #0
  Filled 2023-09-11: qty 90, 90d supply, fill #1

## 2023-04-28 NOTE — Progress Notes (Signed)
 Assessment & Plan:  Nina Hopkins was seen today for medical management of chronic issues.  Diagnoses and all orders for this visit:  Prediabetes -     POCT glycosylated hemoglobin (Hb A1C)  Screening for colon cancer -     Ambulatory referral to Gastroenterology  Primary hypertension -     chlorthalidone (HYGROTON) 25 MG tablet; Take 1 tablet (25 mg total) by mouth daily.  Connective tissue disorder (HCC) -     Ambulatory referral to Rheumatology -     Sedimentation Rate -     C-reactive protein -     ANA w/Reflex -     Anti-DNA antibody, double-stranded -     TSH+T4F+T3Free+ThyAbs+TPO+VD25-[facility] -     CYCLIC CITRUL PEPTIDE ANTIBODY, IGG/IGA -     CK -     MyoMarker 3 Plus Profile (RDL) -     Rheumatoid factor  Myofascial pain syndrome -     Ambulatory referral to Physical Medicine Rehab  Arthralgia of multiple joints -     Sedimentation Rate -     C-reactive protein -     ANA w/Reflex -     Anti-DNA antibody, double-stranded -     TSH+T4F+T3Free+ThyAbs+TPO+VD25-[facility] -     CYCLIC CITRUL PEPTIDE ANTIBODY, IGG/IGA -     CK -     MyoMarker 3 Plus Profile (RDL) -     Rheumatoid factor    Patient has been counseled on age-appropriate routine health concerns for screening and prevention. These are reviewed and up-to-date. Referrals have been placed accordingly. Immunizations are up-to-date or declined.    Subjective:   Chief Complaint  Patient presents with   Medical Management of Chronic Issues    Nina Hopkins 63 y.o. female presents to office today for prediabetes and referral to rheumatology    Prediabetes A1c at goal. Currently controlled with diet.  Lab Results  Component Value Date   HGBA1C 5.9 04/28/2023      She has a several years history of instability, pain and weakness in the joints of the BLE and BLE including ankles, feet, wrists and hands. She has a history of RA and Lupus in her mother. We ordered extensive lab work for autoimmune  disease 5 years ago and workup was negative. She was recently referred to ortho for right ankle and foot pain. Work up was negative including xray. Brace and PT were recommended however due to work hours she would not be able to devote time to PT. Today she states she was instructed by ortho to speak to me about possible connective tissue disorder.  Ms Port states she just wants to know what's causing all of her joint instability and this has been going on for 30 years with no definitive diagnosis.     Review of Systems  Constitutional:  Negative for fever, malaise/fatigue and weight loss.  HENT: Negative.  Negative for nosebleeds.   Eyes: Negative.  Negative for blurred vision, double vision and photophobia.  Respiratory: Negative.  Negative for cough and shortness of breath.   Cardiovascular: Negative.  Negative for chest pain, palpitations and leg swelling.  Gastrointestinal: Negative.  Negative for heartburn, nausea and vomiting.  Musculoskeletal:  Positive for joint pain. Negative for myalgias.  Neurological:  Positive for weakness. Negative for dizziness, focal weakness, seizures and headaches.  Psychiatric/Behavioral: Negative.  Negative for suicidal ideas.     Past Medical History:  Diagnosis Date   Anemia    Anxiety ~ 2008   no meds  Dyspnea    hx - r/t anemia per patient   Family history of adverse reaction to anesthesia    "my sister had a hard time waking up"   History of blood transfusion 12/20/2014   related to anemia   Hyperlipidemia    Hypertension    Missed ab    x 1 - no surgery required   SVD (spontaneous vaginal delivery)    x 2    Past Surgical History:  Procedure Laterality Date   ABDOMINAL HYSTERECTOMY N/A 03/17/2017   Procedure: HYSTERECTOMY ABDOMINAL;  Surgeon: Allie Bossier, MD;  Location: [facility];  Service: Gynecology;  Laterality: N/A;   CERVIX LESION DESTRUCTION     cryro   CYSTOSCOPY N/A 03/17/2017   Procedure: CYSTOSCOPY;  Surgeon: Allie Bossier, MD;  Location: [facility];  Service: Gynecology;  Laterality: N/A;   DILATION AND CURETTAGE, DIAGNOSTIC / THERAPEUTIC      in office   SALPINGOOPHORECTOMY Bilateral 03/17/2017   Procedure: SALPINGO OOPHORECTOMY;  Surgeon: Allie Bossier, MD;  Location: [facility];  Service: Gynecology;  Laterality: Bilateral;   TUBAL LIGATION  1990's    Family History  Problem Relation Age of Onset   Cancer Mother        BRAIN TUMOR, VULVAR   Hypertension Mother    Diabetes Mother    Cancer Father        PENILE   Cancer Maternal Grandmother        LUNG   Cancer Maternal Grandfather        COLON   Diabetes Sister    Hypertension Sister    Heart disease Sister     Social History Reviewed with no changes to be made today.   Outpatient Medications Prior to Visit  Medication Sig Dispense Refill   albuterol (VENTOLIN HFA) 108 (90 Base) MCG/ACT inhaler Inhale 1-2 puffs into the lungs every 6 (six) hours as needed for wheezing or shortness of breath. 8.5 g 0   escitalopram (LEXAPRO) 10 MG tablet Take 1 tablet (10 mg total) by mouth daily. 30 tablet 3   fluticasone (FLONASE) 50 MCG/ACT nasal spray Place 2 sprays into both nostrils daily. 16 g 6   methocarbamol (ROBAXIN) 500 MG tablet Take 1 tablet (500 mg total) by mouth every 8 (eight) hours as needed for muscle spasms. 20 tablet 0   Vitamin D, Ergocalciferol, (DRISDOL) 1.25 MG (50000 UNIT) CAPS capsule Take 1 capsule (50,000 Units total) by mouth every 7 (seven) days. 12.857 each 0   chlorthalidone (HYGROTON) 25 MG tablet Take 1 tablet (25 mg total) by mouth daily. 90 tablet 0   tamsulosin (FLOMAX) 0.4 MG CAPS capsule Take 1 capsule (0.4 mg total) by mouth daily. (Patient not taking: Reported on 02/26/2023) 30 capsule 0   No facility-administered medications prior to visit.    Allergies  Allergen Reactions   Prilosec [Omeprazole]     Stiff neck       Objective:    BP 130/84 (BP Location: Left Arm, Patient Position: Sitting, Cuff Size: Normal)    Pulse 74   Resp 19   Ht 5\' 1"  (1.549 m)   Wt 215 lb 9.6 oz (97.8 kg)   LMP 03/02/2017 (Approximate)   SpO2 97%   BMI 40.74 kg/m  Wt Readings from Last 3 Encounters:  04/28/23 215 lb 9.6 oz (97.8 kg)  02/26/23 220 lb 9.6 oz (100.1 kg)  12/11/22 219 lb 12.8 oz (99.7 kg)    Physical Exam Vitals and nursing  note reviewed.  Constitutional:      Appearance: She is well-developed.  HENT:     Head: Normocephalic and atraumatic.  Cardiovascular:     Rate and Rhythm: Normal rate and regular rhythm.     Heart sounds: Normal heart sounds. No murmur heard.    No friction rub. No gallop.  Pulmonary:     Effort: Pulmonary effort is normal. No tachypnea or respiratory distress.     Breath sounds: Normal breath sounds. No decreased breath sounds, wheezing, rhonchi or rales.  Chest:     Chest wall: No tenderness.  Abdominal:     General: Bowel sounds are normal.     Palpations: Abdomen is soft.  Musculoskeletal:        General: Normal range of motion.     Cervical back: Normal range of motion.  Skin:    General: Skin is warm and dry.  Neurological:     Mental Status: She is alert and oriented to person, place, and time.     Coordination: Coordination normal.  Psychiatric:        Behavior: Behavior normal. Behavior is cooperative.        Thought Content: Thought content normal.        Judgment: Judgment normal.          Patient has been counseled extensively about nutrition and exercise as well as the importance of adherence with medications and regular follow-up. The patient was given clear instructions to go to ER or return to medical center if symptoms don't improve, worsen or new problems develop. The patient verbalized understanding.   Follow-up: Return in about 6 months (around 10/29/2023).   Claiborne Rigg, FNP-BC [facility] [place], [place] [phone]   04/28/2023, 4:49 PM

## 2023-05-01 ENCOUNTER — Encounter: Payer: Self-pay | Admitting: Physical Medicine & Rehabilitation

## 2023-05-11 ENCOUNTER — Encounter: Payer: Self-pay | Admitting: Nurse Practitioner

## 2023-05-15 ENCOUNTER — Other Ambulatory Visit (HOSPITAL_COMMUNITY): Payer: Self-pay

## 2023-05-26 LAB — ENA+DNA/DS+ANTICH+CENTRO+FA...
Anti JO-1: <0.2 AI (ref 0.0–0.9)
Antiribosomal P Antibodies: <0.2 AI (ref 0.0–0.9)
Centromere Ab Screen: <0.2 AI (ref 0.0–0.9)
Chromatin Ab SerPl-aCnc: <0.2 AI (ref 0.0–0.9)
ENA RNP Ab: <0.2 AI (ref 0.0–0.9)
ENA SM Ab Ser-aCnc: <0.2 AI (ref 0.0–0.9)
ENA SSA (RO) Ab: <0.2 AI (ref 0.0–0.9)
ENA SSB (LA) Ab: <0.2 AI (ref 0.0–0.9)
Scleroderma (Scl-70) (ENA) Antibody, IgG: <0.2 AI (ref 0.0–0.9)
Smith/RNP Antibodies: <0.2 AI (ref 0.0–0.9)
Speckled Pattern: 1:160 {titer} — ABNORMAL HIGH

## 2023-05-26 LAB — TSH+T4F+T3FREE+THYABS+TPO+VD25
Free T4: 1.01 ng/dL (ref 0.82–1.77)
T3, Free: 2.6 pg/mL (ref 2.0–4.4)
TSH: 1.3 u[IU]/mL (ref 0.450–4.500)
Thyroglobulin Antibody: 3.7 [IU]/mL — ABNORMAL HIGH (ref 0.0–0.9)
Thyroperoxidase Ab SerPl-aCnc: 18 [IU]/mL (ref 0–34)
Vit D, 25-Hydroxy: 41.7 ng/mL (ref 30.0–100.0)

## 2023-05-26 LAB — CK: Total CK: 268 U/L — ABNORMAL HIGH (ref 32–182)

## 2023-05-26 LAB — MYOMARKER 3 PLUS PROFILE (RDL)
Anti-EJ Ab (RDL): NEGATIVE
Anti-Jo-1 Ab (RDL): <20 U (ref <20)
Anti-Ku Ab (RDL): NEGATIVE
Anti-MDA-5 Ab (CADM-140)(RDL): <20 U (ref <20)
Anti-Mi-2 Ab (RDL): NEGATIVE
Anti-OJ Ab (RDL): NEGATIVE
Anti-PL-12 Ab (RDL: NEGATIVE
Anti-PL-7 Ab (RDL): NEGATIVE
Anti-PM/Scl-100 Ab (RDL): <20 U (ref <20)
Anti-SAE1 Ab, IgG (RDL): <20 U (ref <20)
Anti-SRP Ab (RDL): NEGATIVE
Anti-SS-A 52kD Ab, IgG (RDL): <20 U (ref <20)
Anti-TIF-1gamma Ab (RDL): <20 U (ref <20)
Anti-U1 RNP Ab (RDL): <20 U (ref <20)
Anti-U2 RNP Ab (RDL): NEGATIVE
Anti-U3 RNP (Fibrillarin)(RDL): NEGATIVE

## 2023-05-26 LAB — ANTI-DNA ANTIBODY, DOUBLE-STRANDED: dsDNA Ab: <1 [IU]/mL (ref 0–9)

## 2023-05-26 LAB — CYCLIC CITRUL PEPTIDE ANTIBODY, IGG/IGA: Cyclic Citrullin Peptide Ab: 2 U (ref 0–19)

## 2023-05-26 LAB — C-REACTIVE PROTEIN: CRP: 2 mg/L (ref 0–10)

## 2023-05-26 LAB — RHEUMATOID FACTOR: Rheumatoid fact SerPl-aCnc: <10 [IU]/mL (ref <14.0)

## 2023-05-26 LAB — ANA W/REFLEX: ANA Titer 1: POSITIVE — AB

## 2023-05-26 LAB — SEDIMENTATION RATE: Sed Rate: 26 mm/h (ref 0–40)

## 2023-05-28 ENCOUNTER — Encounter: Payer: Self-pay | Admitting: Gastroenterology

## 2023-05-28 ENCOUNTER — Encounter: Payer: Self-pay | Admitting: Nurse Practitioner

## 2023-06-04 ENCOUNTER — Ambulatory Visit (HOSPITAL_COMMUNITY): Payer: Medicaid Other | Admitting: Clinical

## 2023-06-07 ENCOUNTER — Ambulatory Visit
Admission: EM | Admit: 2023-06-07 | Discharge: 2023-06-07 | Disposition: A | Discharge status: Home / Self Care | Attending: Physician Assistant | Admitting: Physician Assistant

## 2023-06-07 ENCOUNTER — Ambulatory Visit (INDEPENDENT_AMBULATORY_CARE_PROVIDER_SITE_OTHER)

## 2023-06-07 DIAGNOSIS — K649 Unspecified hemorrhoids: Secondary | ICD-10-CM | POA: Diagnosis not present

## 2023-06-07 DIAGNOSIS — R051 Acute cough: Secondary | ICD-10-CM

## 2023-06-07 DIAGNOSIS — J189 Pneumonia, unspecified organism: Secondary | ICD-10-CM

## 2023-06-07 LAB — POCT URINALYSIS DIP (MANUAL ENTRY)
Bilirubin, UA: NEGATIVE
Blood, UA: NEGATIVE
Glucose, UA: NEGATIVE mg/dL
Ketones, POC UA: NEGATIVE mg/dL
Leukocytes, UA: NEGATIVE
Nitrite, UA: NEGATIVE
Protein Ur, POC: NEGATIVE mg/dL
Spec Grav, UA: 1.015 (ref 1.010–1.025)
Urobilinogen, UA: 0.2 U/dL
pH, UA: 6 (ref 5.0–8.0)

## 2023-06-07 LAB — POC HEMOCCULT BLD/STL (OFFICE/1-CARD/DIAGNOSTIC): Fecal Occult Blood, POC: POSITIVE — AB

## 2023-06-07 MED ORDER — DOXYCYCLINE HYCLATE 100 MG PO CAPS: 100.0000 mg | ORAL_CAPSULE | Freq: Two times a day (BID) | ORAL | 0 refills | Status: DC

## 2023-06-07 MED ORDER — HYDROCORTISONE (PERIANAL) 2.5 % EX CREA: 1.0000 | TOPICAL_CREAM | Freq: Two times a day (BID) | CUTANEOUS | 0 refills | Status: DC

## 2023-06-07 MED ORDER — BENZONATATE 100 MG PO CAPS: 100.0000 mg | ORAL_CAPSULE | Freq: Three times a day (TID) | ORAL | 0 refills | Status: DC

## 2023-06-07 MED ORDER — AMOXICILLIN-POT CLAVULANATE 875-125 MG PO TABS
1.0000 | ORAL_TABLET | Freq: Two times a day (BID) | ORAL | 0 refills | Status: DC
Start: 2023-06-07 — End: 2023-06-19

## 2023-06-07 NOTE — Discharge Instructions (Signed)
 We are treating you for pneumonia.  Please start Augmentin  twice daily for 7 days.  Start doxycycline 100 mg twice daily for 7 days.  Stay out of the sun while on this medication as it will cause you to have a bad sunburn.  I will contact you if any of your blood work is abnormal.  Use the Anusol cream to help with the hemorrhoid.  I believe this is the cause of your blood but I would also like you to follow-up with your primary care.  If your blood counts are abnormal I will let you know.  Avoid constipation by eating plenty of fiber and drinking lots of water .  If anything worsens and you have a larger amount of blood in your stool, chest pain, shortness of breath, fever, nausea/vomiting interfering with oral intake, weakness you need to be seen immediately.
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
## 2023-06-07 NOTE — ED Triage Notes (Signed)
 Patient to Urgent Care with complaints of intermittently productive cough. Symptoms started three weeks ago. Feels worse over the last three days with "violent" coughing spells.   Reports also having some bright red bleeding when wiping after using the bathroom. Symptoms x3 days.   Dark urine x3 days. Denies any other symptoms.

## 2023-06-08 ENCOUNTER — Ambulatory Visit

## 2023-06-08 ENCOUNTER — Encounter: Admitting: Physical Medicine & Rehabilitation

## 2023-06-09 ENCOUNTER — Encounter

## 2023-06-09 LAB — CBC WITH DIFFERENTIAL/PLATELET
Basophils Absolute: 0.1 10*3/uL (ref 0.0–0.2)
Basos: 1 %
EOS (ABSOLUTE): 0.2 10*3/uL (ref 0.0–0.4)
Eos: 3 %
Hematocrit: 37.5 % (ref 34.0–46.6)
Hemoglobin: 12.8 g/dL (ref 11.1–15.9)
Immature Grans (Abs): 0 10*3/uL (ref 0.0–0.1)
Immature Granulocytes: 0 %
Lymphocytes Absolute: 1.6 10*3/uL (ref 0.7–3.1)
Lymphs: 28 %
MCH: 29.8 pg (ref 26.6–33.0)
MCHC: 34.1 g/dL (ref 31.5–35.7)
MCV: 87 fL (ref 79–97)
Monocytes Absolute: 0.3 10*3/uL (ref 0.1–0.9)
Monocytes: 6 %
Neutrophils Absolute: 3.6 10*3/uL (ref 1.4–7.0)
Neutrophils: 62 %
Platelets: 306 10*3/uL (ref 150–450)
RBC: 4.29 x10E6/uL (ref 3.77–5.28)
RDW: 11.9 % (ref 11.7–15.4)
WBC: 5.6 10*3/uL (ref 3.4–10.8)

## 2023-06-09 LAB — BASIC METABOLIC PANEL WITH GFR
BUN/Creatinine Ratio: 11 — ABNORMAL LOW (ref 12–28)
BUN: 8 mg/dL (ref 8–27)
CO2: 24 mmol/L (ref 20–29)
Calcium: 9.3 mg/dL (ref 8.7–10.3)
Chloride: 100 mmol/L (ref 96–106)
Creatinine, Ser: 0.74 mg/dL (ref 0.57–1.00)
Glucose: 95 mg/dL (ref 70–99)
Potassium: 3.4 mmol/L — ABNORMAL LOW (ref 3.5–5.2)
Sodium: 140 mmol/L (ref 134–144)
eGFR: 92 mL/min/{1.73_m2} (ref >59)

## 2023-06-10 ENCOUNTER — Ambulatory Visit: Payer: Self-pay

## 2023-06-10 NOTE — Telephone Encounter (Signed)
  Chief Complaint: cough Symptoms: cough, green/yellow mucus Frequency: the last few days  Pertinent Negatives: Patient denies fever, sob, wheezing Disposition: [] ED /[x] Urgent Care (no appt availability in office) / [] Appointment(In office/virtual)/ []  [facility]/ [] Home Care/ [] Refused Recommended Disposition /[] [facility]/ []  Follow-up with PCP Additional Notes: Patient reports that she was seen at [facility] a few days ago for her cough and was prescribed antibiotics. Patient reports she is unsure if they are working as she is still experiencing cough and is now coughing up green and yellow phlegm. Patient states she would feel more comfortable having someone listen to her lungs to make sure she is not getting any worse. Attempted to schedule in office appt, no availability until the end of this month, so advised patient of UC recommendation to have her lungs listened to per request. Patient advised to call back with worsening symptoms. Patient verbalized understanding.   Copied from CRM [id number]. Topic: Clinical - Red Word Triage >> Jun 10, 2023 10:46 AM Juluis Ok wrote: [facility] that prompted transfer to Nurse Triage: cough w/ mucous, extreme fatigue Reason for Disposition  [1] Continuous (nonstop) coughing interferes with work or school AND [2] no improvement using cough treatment per Care Advice  Answer Assessment - Initial Assessment Questions 1. ONSET: "When did the cough begin?"      A few days ago 2. SEVERITY: "How bad is the cough today?"      severe 3. SPUTUM: "Describe the color of your sputum" (none, dry cough; clear, white, yellow, green)     Yellow/green 4. HEMOPTYSIS: "Are you coughing up any blood?" If so ask: "How much?" (flecks, streaks, tablespoons, etc.)     denies 5. DIFFICULTY BREATHING: "Are you having difficulty breathing?" If Yes, ask: "How bad is it?" (e.g., mild, moderate, severe)    - MILD: No SOB at rest, mild SOB with walking, speaks normally  in sentences, can lie down, no retractions, pulse < 100.    - MODERATE: SOB at rest, SOB with minimal exertion and prefers to sit, cannot lie down flat, speaks in phrases, mild retractions, audible wheezing, pulse 100-120.    - SEVERE: Very SOB at rest, speaks in single words, struggling to breathe, sitting hunched forward, retractions, pulse > 120      denies 6. FEVER: "Do you have a fever?" If Yes, ask: "What is your temperature, how was it measured, and when did it start?"     denies 7. CARDIAC HISTORY: "Do you have any history of heart disease?" (e.g., heart attack, congestive heart failure)      denies 8. LUNG HISTORY: "Do you have any history of lung disease?"  (e.g., pulmonary embolus, asthma, emphysema)     denies 9. PE RISK FACTORS: "Do you have a history of blood clots?" (or: recent major surgery, recent prolonged travel, bedridden)     denies 10. OTHER SYMPTOMS: "Do you have any other symptoms?" (e.g., runny nose, wheezing, chest pain)       fatigue  Protocols used: Cough - Acute Productive-A-AH

## 2023-06-10 NOTE — Telephone Encounter (Signed)
 Noted.

## 2023-06-12 ENCOUNTER — Encounter (HOSPITAL_COMMUNITY): Payer: Self-pay

## 2023-06-12 ENCOUNTER — Ambulatory Visit
Admission: RE | Admit: 2023-06-12 | Discharge: 2023-06-12 | Disposition: A | Discharge status: Home / Self Care | Payer: Self-pay | Source: Ambulatory Visit | Attending: Nurse Practitioner | Admitting: Nurse Practitioner

## 2023-06-12 VITALS — BP 115/78 | HR 80 | Temp 97.7°F | Resp 16

## 2023-06-12 DIAGNOSIS — J189 Pneumonia, unspecified organism: Secondary | ICD-10-CM
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
## 2023-06-12 NOTE — ED Triage Notes (Signed)
 Patient presents to UC for FU for pneumonia. States cough has improved since taking her antibiotics. She reports back and chest pain yesterday improved today. Here for eval for provider to listen to her lungs.   Denies fever or SOB.

## 2023-06-12 NOTE — Discharge Instructions (Addendum)
 Declined avs

## 2023-06-19 ENCOUNTER — Other Ambulatory Visit: Payer: Self-pay

## 2023-06-19 ENCOUNTER — Ambulatory Visit (AMBULATORY_SURGERY_CENTER)

## 2023-06-19 VITALS — Ht 61.0 in | Wt 209.0 lb

## 2023-06-19 DIAGNOSIS — Z1211 Encounter for screening for malignant neoplasm of colon: Secondary | ICD-10-CM

## 2023-06-19 MED ORDER — PEG 3350-KCL-NA BICARB-NACL 420 G PO SOLR: 4000.0000 mL | Freq: Once | ORAL | 0 refills | Status: AC

## 2023-06-19 NOTE — Progress Notes (Signed)
 Denies allergies to eggs or soy products. Denies complication of anesthesia or sedation. Denies use of weight loss medication. Denies use of O2.   Emmi instructions given for colonoscopy.

## 2023-06-23 ENCOUNTER — Encounter: Admitting: Gastroenterology

## 2023-07-01 ENCOUNTER — Other Ambulatory Visit: Payer: Self-pay

## 2023-07-01 ENCOUNTER — Other Ambulatory Visit (HOSPITAL_COMMUNITY): Payer: Self-pay

## 2023-07-07 ENCOUNTER — Telehealth: Payer: Self-pay | Admitting: Gastroenterology

## 2023-07-07 NOTE — Telephone Encounter (Signed)
 Inbound call from patient stating she has a cough that she is having to take medicine for. Patient is requesting a call to discuss how to proceed with medication with upcoming colonoscopy. Please advise, thank you.

## 2023-07-07 NOTE — Telephone Encounter (Signed)
 Pt has a cough that she is taking meds for. Phlegm is clear to light yellow. Thin and sometimes "a little thicker". Denies fever. Instructed pt that as long as she does not have a fever she can come for procedure but she will need to wear a mask. Instructed pt that if she develops a fever on that day of procedure she will need to call cancel and reschedule. Pt stated that she understood and no other questions at this time.

## 2023-07-09 ENCOUNTER — Ambulatory Visit: Admitting: Gastroenterology

## 2023-07-09 ENCOUNTER — Encounter: Payer: Self-pay | Admitting: Gastroenterology

## 2023-07-09 VITALS — BP 140/85 | HR 65 | Temp 98.0°F | Resp 14 | Ht 61.0 in | Wt 209.0 lb

## 2023-07-09 DIAGNOSIS — F32A Depression, unspecified: Secondary | ICD-10-CM | POA: Diagnosis not present

## 2023-07-09 DIAGNOSIS — K648 Other hemorrhoids: Secondary | ICD-10-CM

## 2023-07-09 DIAGNOSIS — K641 Second degree hemorrhoids: Secondary | ICD-10-CM | POA: Diagnosis not present

## 2023-07-09 DIAGNOSIS — D125 Benign neoplasm of sigmoid colon: Secondary | ICD-10-CM | POA: Diagnosis not present

## 2023-07-09 DIAGNOSIS — F419 Anxiety disorder, unspecified: Secondary | ICD-10-CM | POA: Diagnosis not present

## 2023-07-09 DIAGNOSIS — I10 Essential (primary) hypertension: Secondary | ICD-10-CM | POA: Diagnosis not present

## 2023-07-09 DIAGNOSIS — K635 Polyp of colon: Secondary | ICD-10-CM

## 2023-07-09 DIAGNOSIS — D123 Benign neoplasm of transverse colon: Secondary | ICD-10-CM

## 2023-07-09 DIAGNOSIS — D122 Benign neoplasm of ascending colon: Secondary | ICD-10-CM | POA: Diagnosis not present

## 2023-07-09 DIAGNOSIS — Z1211 Encounter for screening for malignant neoplasm of colon: Secondary | ICD-10-CM

## 2023-07-09 DIAGNOSIS — K5730 Diverticulosis of large intestine without perforation or abscess without bleeding: Secondary | ICD-10-CM | POA: Diagnosis not present

## 2023-07-09 DIAGNOSIS — E785 Hyperlipidemia, unspecified: Secondary | ICD-10-CM | POA: Diagnosis not present

## 2023-07-09 MED ORDER — SODIUM CHLORIDE 0.9 % IV SOLN: 500.0000 mL | Freq: Once | INTRAVENOUS | Status: DC

## 2023-07-09 NOTE — Progress Notes (Signed)
 Pt's states no medical or surgical changes since previsit or office visit.

## 2023-07-09 NOTE — Progress Notes (Signed)
 Called to room to assist during endoscopic procedure.  Patient ID and intended procedure confirmed with present staff. Received instructions for my participation in the procedure from the performing physician.

## 2023-07-09 NOTE — Progress Notes (Signed)
 To pacu, VSS. Report to [email or web address].tb

## 2023-07-09 NOTE — Op Note (Signed)
 [facility] Patient Name: Nina Hopkins Procedure Date: 07/09/2023 3:17 PM MRN: [id number] Endoscopist: Harry Lindau , MD, [id number] Age: 63 Referring MD:  Date of Birth: 11-19-1960 Gender: Female Account #: [id number] Procedure:                Colonoscopy Indications:              Screening for colorectal malignant neoplasm, This                            is the patient's first colonoscopy                           No active GI symptoms. Family history notable for                            paternal grandfather with colon cancer. No known                            first-degree relatives with colon cancer. Medicines:                Monitored Anesthesia Care Procedure:                Pre-Anesthesia Assessment:                           - Prior to the procedure, a History and Physical                            was performed, and patient medications and                            allergies were reviewed. The patient's tolerance of                            previous anesthesia was also reviewed. The risks                            and benefits of the procedure and the sedation                            options and risks were discussed with the patient.                            All questions were answered, and informed consent                            was obtained. Prior Anticoagulants: The patient has                            taken no anticoagulant or antiplatelet agents. ASA                            Grade Assessment: II - A patient with mild systemic  disease. After reviewing the risks and benefits,                            the patient was deemed in satisfactory condition to                            undergo the procedure.                           After obtaining informed consent, the colonoscope                            was passed under direct vision. Throughout the                            procedure, the patient's blood  pressure, pulse, and                            oxygen saturations were monitored continuously. The                            Olympus Scope SN: [id number] was introduced through                            the anus and advanced to the the terminal ileum.                            The terminal ileum, ileocecal valve, appendiceal                            orifice, and rectum were photographed. Scope In: 3:23:55 PM Scope Out: 3:47:01 PM Scope Withdrawal Time: 0 hours 17 minutes 17 seconds  Total Procedure Duration: 0 hours 23 minutes 6 seconds  Findings:                 The perianal and digital rectal examinations were                            normal.                           Two sessile polyps were found in the splenic                            flexure and transverse colon. The polyps were 3 to                            5 mm in size. These polyps were removed with a cold                            snare. Resection and retrieval were complete.                            Estimated blood loss was minimal.  Two sessile polyps were found in the distal sigmoid                            colon. The polyps were 2 to 3 mm in size. These                            polyps were removed with a cold snare. Resection                            and retrieval were complete. Estimated blood loss                            was minimal.                           A few small-mouthed diverticula were found in the                            sigmoid colon.                           Non-bleeding internal hemorrhoids were found during                            retroflexion. The hemorrhoids were small.                           The terminal ileum appeared normal. Complications:            No immediate complications. Estimated Blood Loss:     Estimated blood loss was minimal. Impression:               - Two 3 to 5 mm polyps at the splenic flexure and                            in the  transverse colon, removed with a cold snare.                            Resected and retrieved.                           - Two 2 to 3 mm polyps in the distal sigmoid colon,                            removed with a cold snare. Resected and retrieved.                           - Diverticulosis in the sigmoid colon.                           - Non-bleeding internal hemorrhoids.                           - The examined portion of the ileum was normal. Recommendation:           -  Patient has a contact number available for                            emergencies. The signs and symptoms of potential                            delayed complications were discussed with the                            patient. Return to normal activities tomorrow.                            Written discharge instructions were provided to the                            patient.                           - Resume previous diet.                           - Continue present medications.                           - Await pathology results.                           - Repeat colonoscopy for surveillance based on                            pathology results.                           - Return to GI office PRN. Harry Lindau, MD 07/09/2023 3:51:56 PM

## 2023-07-09 NOTE — Progress Notes (Signed)
 GASTROENTEROLOGY PROCEDURE H&P NOTE   Primary Care Physician: Collins Dean, NP    Reason for Procedure:  Colon Cancer screening  Plan:    Colonoscopy  Patient is appropriate for endoscopic procedure(s) in the ambulatory ([facility]) setting.  The nature of the procedure, as well as the risks, benefits, and alternatives were carefully and thoroughly reviewed with the patient. Ample time for discussion and questions allowed. The patient understood, was satisfied, and agreed to proceed.     HPI: Nina Hopkins is a 63 y.o. female who presents for colonoscopy for routine Colon Cancer screening.  No active GI symptoms.  Family history notable for paternal grandfather with colon cancer.  No known first-degree relatives with colon cancer.  Past Medical History:  Diagnosis Date   Allergy    Anemia    Anxiety ~ 2008   no meds   Arthritis    Depression    Dyspnea    hx - r/t anemia per patient   Family history of adverse reaction to anesthesia    "my sister had a hard time waking up"   History of blood transfusion 12/20/2014   related to anemia   Hyperlipidemia    Hypertension    Missed ab    x 1 - no surgery required   SVD (spontaneous vaginal delivery)    x 2    Past Surgical History:  Procedure Laterality Date   ABDOMINAL HYSTERECTOMY N/A 03/17/2017   Procedure: HYSTERECTOMY ABDOMINAL;  Surgeon: Ana Balling, MD;  Location: [facility];  Service: Gynecology;  Laterality: N/A;   CERVIX LESION DESTRUCTION     cryro   CYSTOSCOPY N/A 03/17/2017   Procedure: CYSTOSCOPY;  Surgeon: Ana Balling, MD;  Location: [facility];  Service: Gynecology;  Laterality: N/A;   DILATION AND CURETTAGE, DIAGNOSTIC / THERAPEUTIC      in office   SALPINGOOPHORECTOMY Bilateral 03/17/2017   Procedure: SALPINGO OOPHORECTOMY;  Surgeon: Ana Balling, MD;  Location: [facility];  Service: Gynecology;  Laterality: Bilateral;   TUBAL LIGATION  1990's    Prior to Admission medications   Medication Sig Start  Date End Date Taking? Authorizing Provider  benzonatate  (TESSALON ) 100 MG capsule Take 1 capsule (100 mg total) by mouth every 8 (eight) hours. 06/07/23  Yes Raspet, Erin K, PA-C  chlorthalidone  (HYGROTON ) 25 MG tablet Take 1 tablet (25 mg total) by mouth daily. 04/28/23  Yes Fleming, Zelda W, NP  escitalopram  (LEXAPRO ) 10 MG tablet Take 1 tablet (10 mg total) by mouth daily. 02/26/23  Yes McClung, Stan Eans, PA-C  Vitamin D , Ergocalciferol , (DRISDOL ) 1.25 MG (50000 UNIT) CAPS capsule Take 1 capsule (50,000 Units total) by mouth every 7 (seven) days. 03/04/23  Yes Hassie Lint, PA-C  acetaminophen  (TYLENOL ) 500 MG tablet Take 500 mg by mouth every 6 (six) hours as needed.    [provider]  albuterol  (VENTOLIN  HFA) 108 (90 Base) MCG/ACT inhaler Inhale 1-2 puffs into the lungs every 6 (six) hours as needed for wheezing or shortness of breath. 11/28/22   Newlin, Enobong, MD  fluticasone  (FLONASE ) 50 MCG/ACT nasal spray Place 2 sprays into both nostrils daily. 12/11/22   McClung, Angela M, PA-C  hydrocortisone  (ANUSOL -HC) 2.5 % rectal cream Place 1 Application rectally 2 (two) times daily. 06/07/23   Raspet, Erin K, PA-C  methocarbamol  (ROBAXIN ) 500 MG tablet Take 1 tablet (500 mg total) by mouth every 8 (eight) hours as needed for muscle spasms. 11/27/22   Long, Shereen Dike, MD  Current Outpatient Medications  Medication Sig Dispense Refill   benzonatate  (TESSALON ) 100 MG capsule Take 1 capsule (100 mg total) by mouth every 8 (eight) hours. 21 capsule 0   chlorthalidone  (HYGROTON ) 25 MG tablet Take 1 tablet (25 mg total) by mouth daily. 90 tablet 1   escitalopram  (LEXAPRO ) 10 MG tablet Take 1 tablet (10 mg total) by mouth daily. 30 tablet 3   Vitamin D , Ergocalciferol , (DRISDOL ) 1.25 MG (50000 UNIT) CAPS capsule Take 1 capsule (50,000 Units total) by mouth every 7 (seven) days. 12.857 each 0   acetaminophen  (TYLENOL ) 500 MG tablet Take 500 mg by mouth every 6 (six) hours as needed.      albuterol  (VENTOLIN  HFA) 108 (90 Base) MCG/ACT inhaler Inhale 1-2 puffs into the lungs every 6 (six) hours as needed for wheezing or shortness of breath. 8.5 g 0   fluticasone  (FLONASE ) 50 MCG/ACT nasal spray Place 2 sprays into both nostrils daily. 16 g 6   hydrocortisone  (ANUSOL -HC) 2.5 % rectal cream Place 1 Application rectally 2 (two) times daily. 30 g 0   methocarbamol  (ROBAXIN ) 500 MG tablet Take 1 tablet (500 mg total) by mouth every 8 (eight) hours as needed for muscle spasms. 20 tablet 0   Current Facility-Administered Medications  Medication Dose Route Frequency Provider Last Rate Last Admin   0.9 %  sodium chloride  infusion  500 mL Intravenous Once Nasim Garofano V, DO        Allergies as of 07/09/2023 - Review Complete 07/09/2023  Allergen Reaction Noted   Prilosec [omeprazole]  04/27/2014    Family History  Problem Relation Age of Onset   Cancer Mother        BRAIN TUMOR, VULVAR   Hypertension Mother    Diabetes Mother    Cancer Father        PENILE   Diabetes Sister    Hypertension Sister    Heart disease Sister    Esophageal cancer Maternal Uncle    Cancer Maternal Grandmother        LUNG   Colon cancer Maternal Grandfather    Cancer Maternal Grandfather        COLON   Rectal cancer Neg Hx    Stomach cancer Neg Hx     Social History   Socioeconomic History   Marital status: Divorced    Spouse name: Not on file   Number of children: 2   Years of education: Not on file   Highest education level: 12th grade  Occupational History   Not on file  Tobacco Use   Smoking status: Never   Smokeless tobacco: Never  Vaping Use   Vaping status: Never Used  Substance and Sexual Activity   Alcohol use: Not Currently    Comment: Wine   Drug use: No   Sexual activity: Not Currently    Birth control/protection: None  Other Topics Concern   Not on file  Social History Narrative   Not on file   Social Drivers of Health   Financial Resource Strain: Medium  Risk (02/26/2023)   Overall Financial Resource Strain (CARDIA)    Difficulty of Paying Living Expenses: Somewhat hard  Food Insecurity: Food Insecurity Present (02/26/2023)   Hunger Vital Sign    Worried About Running Out of Food in the Last Year: Never true    Ran Out of Food in the Last Year: Often true  Transportation Needs: No Transportation Needs (02/26/2023)   PRAPARE - Administrator, Civil Service (Medical):  No    Lack of Transportation (Non-Medical): No  Physical Activity: Insufficiently Active (02/26/2023)   Exercise Vital Sign    Days of Exercise per Week: 5 days    Minutes of Exercise per Session: 20 min  Stress: Stress Concern Present (03/24/2023)   [employer] of Occupational Health - Occupational Stress Questionnaire    Feeling of Stress : Rather much  Social Connections: Moderately Isolated (02/26/2023)   Social Connection and Isolation Panel [NHANES]    Frequency of Communication with Friends and Family: More than three times a week    Frequency of Social Gatherings with Friends and Family: More than three times a week    Attends Religious Services: 1 to 4 times per year    Active Member of [employer] or Organizations: No    Attends Banker Meetings: Never    Marital Status: Divorced  Catering manager Violence: Not on file    Physical Exam: Vital signs in last 24 hours: @BP  (!) 137/93   Pulse 77   Temp 98 F (36.7 C) (Temporal)   Ht 5\' 1"  (1.549 m)   Wt 209 lb (94.8 kg)   LMP 03/02/2017 (Approximate)   SpO2 96%   BMI 39.49 kg/m  GEN: NAD EYE: Sclerae anicteric ENT: MMM CV: Non-tachycardic Pulm: CTA b/l GI: Soft, NT/ND NEURO:  Alert & Oriented x 3   Harry Lindau, DO [facility]   07/09/2023 3:18 PM

## 2023-07-09 NOTE — Patient Instructions (Signed)
 Resume previous diet Continue present medications  Await pathology results  Repeat colonoscopy for surveillance based on results Return to GI office as needed See handouts for polyps, diverticulosis and hemorrhoids YOU HAD AN ENDOSCOPIC PROCEDURE TODAY AT THE [facility]:   Refer to the procedure report that was given to you for any specific questions about what was found during the examination.  If the procedure report does not answer your questions, please call your gastroenterologist to clarify.  If you requested that your care partner not be given the details of your procedure findings, then the procedure report has been included in a sealed envelope for you to review at your convenience later.  YOU SHOULD EXPECT: Some feelings of bloating in the abdomen. Passage of more gas than usual.  Walking can help get rid of the air that was put into your GI tract during the procedure and reduce the bloating. If you had a lower endoscopy (such as a colonoscopy or flexible sigmoidoscopy) you may notice spotting of blood in your stool or on the toilet paper. If you underwent a bowel prep for your procedure, you may not have a normal bowel movement for a few days.  Please Note:  You might notice some irritation and congestion in your nose or some drainage.  This is from the oxygen used during your procedure.  There is no need for concern and it should clear up in a day or so.  SYMPTOMS TO REPORT IMMEDIATELY:  Following lower endoscopy (colonoscopy or flexible sigmoidoscopy):  Excessive amounts of blood in the stool  Significant tenderness or worsening of abdominal pains  Swelling of the abdomen that is new, acute  Fever of 100F or higher  For urgent or emergent issues, a gastroenterologist can be reached at any hour by calling [phone]. Do not use MyChart messaging for urgent concerns.   DIET:  We do recommend a small meal at first, but then you may proceed to your regular diet.   Drink plenty of fluids but you should avoid alcoholic beverages for 24 hours.  ACTIVITY:  You should plan to take it easy for the rest of today and you should NOT DRIVE or use heavy machinery until tomorrow (because of the sedation medicines used during the test).    FOLLOW UP: Our staff will call the number listed on your records the next business day following your procedure.  We will call around 7:15- 8:00 am to check on you and address any questions or concerns that you may have regarding the information given to you following your procedure. If we do not reach you, we will leave a message.     If any biopsies were taken you will be contacted by phone or by letter within the next 1-3 weeks.  Please call us  at [phone] if you have not heard about the biopsies in 3 weeks.   SIGNATURES/CONFIDENTIALITY: You and/or your care partner have signed paperwork which will be entered into your electronic medical record.  These signatures attest to the fact that that the information above on your After Visit Summary has been reviewed and is understood.  Full responsibility of the confidentiality of this discharge information lies with you and/or your care-partner.

## 2023-07-10 ENCOUNTER — Telehealth: Payer: Self-pay

## 2023-07-10 NOTE — Telephone Encounter (Signed)
  Follow up Call-     07/09/2023    2:33 PM  Call back number  Post procedure Call Back phone  # [phone]  Permission to leave phone message Yes     Patient questions:  Do you have a fever, pain , or abdominal swelling? No. Pain Score  0 *  Have you tolerated food without any problems? Yes.    Have you been able to return to your normal activities? Yes.    Do you have any questions about your discharge instructions: Diet   No. Medications  No. Follow up visit  No.  Do you have questions or concerns about your Care? No.  Actions: * If pain score is 4 or above: No action needed, pain <4.

## 2023-07-14 ENCOUNTER — Ambulatory Visit: Payer: Self-pay | Admitting: Gastroenterology

## 2023-07-14 LAB — SURGICAL PATHOLOGY

## 2023-07-21 ENCOUNTER — Ambulatory Visit
Admission: RE | Admit: 2023-07-21 | Discharge: 2023-07-21 | Disposition: A | Discharge status: Home / Self Care | Source: Ambulatory Visit | Attending: Emergency Medicine | Admitting: Emergency Medicine

## 2023-07-21 VITALS — BP 131/88 | HR 74 | Temp 98.6°F | Resp 18

## 2023-07-21 DIAGNOSIS — M778 Other enthesopathies, not elsewhere classified: Secondary | ICD-10-CM

## 2023-07-21 MED ORDER — PREDNISONE 10 MG (21) PO TBPK: ORAL_TABLET | Freq: Every day | ORAL | 0 refills | Status: DC

## 2023-07-21 NOTE — ED Triage Notes (Signed)
 Left arm pain for a month.  Patient thought to be arthritis related.  Initially, pain was the elbow, now pain is radiating into left hand and into left shoulder. Pain is described as achy intermittently.  Patient is right handed

## 2023-07-21 NOTE — Discharge Instructions (Signed)
 Today I believe your symptoms are related from inflammation to your tendon commonly known as golfers elbow, more information side in your packet  Begin ibuprofen  taking 600 to 800 mg every 6-8 hours consistently over the next 3 to 4 days then you may use as needed  May continue any topical medicines such as arthritis pain, IcyHot, lidocaine  etc. or similar products  May use heat over the affected area and 10 to 15-minute intervals  May elevate on pillows whenever sitting or lying  Arm has been wrapped in Ace bandage for support, may use as needed until pain resolves  If your symptoms continue to persist please follow-up with orthopedics, information listed in front page
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
## 2023-07-30 ENCOUNTER — Ambulatory Visit (INDEPENDENT_AMBULATORY_CARE_PROVIDER_SITE_OTHER): Admitting: Clinical

## 2023-07-30 DIAGNOSIS — F4310 Post-traumatic stress disorder, unspecified: Secondary | ICD-10-CM

## 2023-07-30 DIAGNOSIS — F331 Major depressive disorder, recurrent, moderate: Secondary | ICD-10-CM | POA: Diagnosis not present

## 2023-07-31 ENCOUNTER — Encounter: Attending: Physical Medicine & Rehabilitation | Admitting: Physical Medicine & Rehabilitation

## 2023-07-31 ENCOUNTER — Encounter: Payer: Self-pay | Admitting: Physical Medicine & Rehabilitation

## 2023-07-31 ENCOUNTER — Ambulatory Visit: Admitting: Physical Medicine & Rehabilitation

## 2023-07-31 VITALS — BP 130/87 | HR 69 | Ht 61.0 in | Wt 214.2 lb

## 2023-07-31 DIAGNOSIS — M797 Fibromyalgia: Secondary | ICD-10-CM | POA: Insufficient documentation

## 2023-07-31 DIAGNOSIS — F32A Depression, unspecified: Secondary | ICD-10-CM | POA: Insufficient documentation

## 2023-07-31 DIAGNOSIS — G8929 Other chronic pain: Secondary | ICD-10-CM | POA: Insufficient documentation

## 2023-07-31 DIAGNOSIS — M25562 Pain in left knee: Secondary | ICD-10-CM | POA: Insufficient documentation

## 2023-07-31 NOTE — Patient Instructions (Addendum)
 Try Tens unit  Try Tai Chi for exercise

## 2023-07-31 NOTE — Progress Notes (Signed)
 Subjective:    Patient ID: Nina Hopkins, female    DOB: 1960-04-06, 63 y.o.   MRN: [id number]  HPI  HPI  Nina Hopkins is a 63 y.o. year old female  who  has a past medical history of Allergy, Anemia, Anxiety (~ 2008), Arthritis, Depression, Dyspnea, Family history of adverse reaction to anesthesia, History of blood transfusion (12/20/2014), Hyperlipidemia, Hypertension, Missed ab, and SVD (spontaneous vaginal delivery).   They are presenting to PM&R clinic as a new patient for pain management evaluation. They were referred by Haze Servant NP for treatment of Fibromyalgia pain.  Thank you for the referral of this very nice patient Patient reports she developed spasms in her upper back when she was in her 63s.  More recently she has been having pain in her right lower back pain in her right lower back, she feels like this is muscle pain.  She also reports discomfort in her left ankle and left wrist and she reports these joints often feel unsteady.  For the past few months she has been having a lot of pain in her left arm.  She also has left knee pain and this area will swell when the weather is bad.  Patient reports frequent fatigue and difficulty focusing.  Pain is not constant and comes and goes in different areas of her body.  Sometimes symptoms will last for few days sometimes weeks.  Pain is overall worse on the left side of her body although she will often have pain on the right side of her body too.  Pain is burning in quality frequently, other times it is achy.  Sometimes she has pain due to muscle spasms.  Her mother had fibromyalgia, Lupus, RA.   She reports Haze Servant had discussed where she was felt to have fibromyalgia.  She has a hx of anxiety and depresison on lexapro .   She has been referred for rheumatology and has a visit Aug 15.   No difficulty sleeping, she says she sleeps 6 hours a night. Denies bowel difficulty.   She likes to walk for  exercise  She was seen by ortho on 04/07/23 for ankle instability.  Exam was felt to be benign.   Medications tried: Topical medications- Arthritis cream- helps a little  Nsaids Ibuprofen  helped pain- stopped this when she started lexapro . She is now using tylenol  Tylenol  - helps a little Opiates- only for after surgery  Gabapentin  - Helped with burning pain, not dull pain. She is not sure why she stopped, maybe got sick from it? TCAs  - Denies  SNRIs  - Denies   Other treatments: PT- helped a little Accupuncture/chiropractor/massage - Denies  TENs unit - Denies, Denies personal history of cancer  Injections - denies  Surgery - Denies relevant  Heat helps    Prior UDS results: No results found for: LABOPIA, COCAINSCRNUR, LABBENZ, AMPHETMU, THCU, LABBARB  Pain Inventory Average Pain 6 Pain Right Now 2 My pain is intermittent, sharp, burning, tingling, and aching  In the last 24 hours, has pain interfered with the following? General activity 0 Relation with others 0 Enjoyment of life 0 What TIME of day is your pain at its worst? morning  and daytime Sleep (in general) Fair  Pain is worse with: walking, bending, inactivity, standing, and some activites Pain improves with: rest, heat/ice, and pacing activities Relief from Meds: 4  walk without assistance how many minutes can you walk? Depends on how I am feeling ability to climb  steps?  yes do you drive?  no Do you have any goals in this area?  yes, control pain  employed # of hrs/week 40 + hours a week  bladder control problems numbness tingling trouble walking spasms depression anxiety  Any changes since last visit?  no  Primary care Haze Servant, PCP Rheumatologist Referred to Rheumatology consult September 18, 2023 Psychologist Starting therapy    Family History  Problem Relation Age of Onset   Cancer Mother        BRAIN TUMOR, VULVAR   Hypertension Mother    Diabetes Mother    Cancer  Father        PENILE   Diabetes Sister    Hypertension Sister    Heart disease Sister    Esophageal cancer Maternal Uncle    Cancer Maternal Grandmother        LUNG   Colon cancer Maternal Grandfather    Cancer Maternal Grandfather        COLON   Rectal cancer Neg Hx    Stomach cancer Neg Hx    Social History   Socioeconomic History   Marital status: Divorced    Spouse name: Not on file   Number of children: 2   Years of education: Not on file   Highest education level: 12th grade  Occupational History   Not on file  Tobacco Use   Smoking status: Never   Smokeless tobacco: Never  Vaping Use   Vaping status: Never Used  Substance and Sexual Activity   Alcohol use: Not Currently    Comment: Wine   Drug use: No   Sexual activity: Not Currently    Birth control/protection: None  Other Topics Concern   Not on file  Social History Narrative   Not on file   Social Drivers of Health   Financial Resource Strain: Medium Risk (02/26/2023)   Overall Financial Resource Strain (CARDIA)    Difficulty of Paying Living Expenses: Somewhat hard  Food Insecurity: Food Insecurity Present (02/26/2023)   Hunger Vital Sign    Worried About Running Out of Food in the Last Year: Never true    Ran Out of Food in the Last Year: Often true  Transportation Needs: No Transportation Needs (02/26/2023)   PRAPARE - Administrator, Civil Service (Medical): No    Lack of Transportation (Non-Medical): No  Physical Activity: Insufficiently Active (02/26/2023)   Exercise Vital Sign    Days of Exercise per Week: 5 days    Minutes of Exercise per Session: 20 min  Stress: Stress Concern Present (03/24/2023)   [employer] of Occupational Health - Occupational Stress Questionnaire    Feeling of Stress : Rather much  Social Connections: Moderately Isolated (02/26/2023)   Social Connection and Isolation Panel    Frequency of Communication with Friends and Family: More than three times a  week    Frequency of Social Gatherings with Friends and Family: More than three times a week    Attends Religious Services: 1 to 4 times per year    Active Member of [employer] or Organizations: No    Attends Banker Meetings: Never    Marital Status: Divorced   Past Surgical History:  Procedure Laterality Date   ABDOMINAL HYSTERECTOMY N/A 03/17/2017   Procedure: HYSTERECTOMY ABDOMINAL;  Surgeon: Starla Harland BROCKS, MD;  Location: [facility];  Service: Gynecology;  Laterality: N/A;   CERVIX LESION DESTRUCTION     cryro   CYSTOSCOPY N/A 03/17/2017  Procedure: CYSTOSCOPY;  Surgeon: Starla Harland BROCKS, MD;  Location: [facility];  Service: Gynecology;  Laterality: N/A;   DILATION AND CURETTAGE, DIAGNOSTIC / THERAPEUTIC      in office   SALPINGOOPHORECTOMY Bilateral 03/17/2017   Procedure: SALPINGO OOPHORECTOMY;  Surgeon: Starla Harland BROCKS, MD;  Location: [facility];  Service: Gynecology;  Laterality: Bilateral;   TUBAL LIGATION  1990's   Past Medical History:  Diagnosis Date   Allergy    Anemia    Anxiety ~ 2008   no meds   Arthritis    Depression    Dyspnea    hx - r/t anemia per patient   Family history of adverse reaction to anesthesia    my sister had a hard time waking up   History of blood transfusion 12/20/2014   related to anemia   Hyperlipidemia    Hypertension    Missed ab    x 1 - no surgery required   SVD (spontaneous vaginal delivery)    x 2   BP 130/87 (BP Location: Left Arm, Patient Position: Sitting, Cuff Size: Large)   Pulse 69   Ht 5' 1 (1.549 m)   Wt 214 lb 3.2 oz (97.2 kg)   LMP 03/02/2017 (Approximate)   SpO2 94%   BMI 40.47 kg/m   Opioid Risk Score:   Fall Risk Score:  `1  Depression screen [facility] 2/9     07/31/2023    9:54 AM 07/31/2023    9:53 AM 02/26/2023    9:44 AM 10/29/2022    4:00 PM 04/18/2022   10:25 AM 01/17/2022    1:29 PM 10/26/2020    4:45 PM  Depression screen PHQ 2/9  Decreased Interest 0 0 2 1 0 0 0  Down, Depressed, Hopeless 1 1 3 1  0 0 3   PHQ - 2 Score 1 1 5 2  0 0 3  Altered sleeping 1  1 0 0 3 3  Tired, decreased energy 2  2 0 0 1 1  Change in appetite 2  3 3  0 1 2  Feeling bad or failure about yourself  2  3 2 2  0 3  Trouble concentrating 1  3 3 2 2 3   Moving slowly or fidgety/restless 0  0 0 1 0 1  Suicidal thoughts 0  0 0 0 0 0  PHQ-9 Score 9  17 10 5 7 16   Difficult doing work/chores Not difficult at all      Not difficult at all      Review of Systems  Constitutional:  Positive for unexpected weight change.       Weight gain  Respiratory:  Positive for cough and shortness of breath.        History of Respiratory infections  Cardiovascular:  Positive for leg swelling.       Leg swelling of left leg and knee (intermittent swelling of knee and lower extremity)  Genitourinary:  Positive for frequency.       Coughing or sneezing can cause patient to leak urine  Musculoskeletal:  Positive for back pain, joint swelling and myalgias.       Right side lower back pain, pain across upper back, left arm pain, right wrist pain, left knee pain Muscle spasms Fibromyalgia  Neurological:  Positive for weakness and numbness.       Trouble walking, weakness, numbness  Psychiatric/Behavioral:         Depression and anxiety  All other systems reviewed and are negative.  05/06/2017 xray L spine  IMPRESSION: Mild degenerative change with 3 mm anterolisthesis L4 on L5. No acute abnormality.    L shoulder xray 2022 FINDINGS: There is no evidence of fracture or dislocation. There is no evidence of arthropathy or other focal bone abnormality. Soft tissues are unremarkable.   IMPRESSION: Negative.  R ankle xray 04/07/23 This of her right ankle demonstrate no evidence of fracture  well-maintained alignment no significant degenerative changes she does  have spurring at both the plantar and posterior aspect of the calcaneus      Objective:   Physical Exam  Gen: no distress, normal appearing HEENT: oral mucosa pink and  moist, NCAT Chest: normal effort, normal rate of breathing Abd: soft, non-distended Ext: no edema Psych: pleasant, normal affect Skin: intact Neuro: Alert and awake, follows commands, cranial nerves II through XII grossly intact, normal speech and language RUE: 5/5 Deltoid, 5/5 Biceps, 5/5 Triceps, 5/5 Wrist Ext, 5/5 Grip LUE: 5/5 Deltoid, 5/5 Biceps, 5/5 Triceps, 5/5 Wrist Ext, 5/5 Grip RLE: HF 5/5, KE 5/5, ADF 5/5, APF 5/5 LLE: HF 5/5, KE 5/5, ADF 5/5, APF 5/5 Sensory exam normal for light touch and pain in all 4 limbs. No limb ataxia or cerebellar signs. No abnormal tone appreciated.  No abnormal tone noted Musculoskeletal:  No significant TTP T-spine, L-spine and C-spine paraspinal muscles No significant TTP bilateral parascapular muscles TTP upper left arm and forearm  Minimal ankle TTP b/l Minimal knee TTP b/l L ankle pain with ROM in all directions  Gait normal step length and cadence, no instability noted Forward head posture    Assessment & Plan:    1) Fibromyalgia - I agree that her symptoms do seem consistent with fibromyalgia.  2) Depression/Anxiety, no SI or HI.  She is on Lexapro   4) L knee and ankle pain  3) Possibly sleep disorder, reports she sleeps okay overall but only about  6 hours a night  -Food for pain discussed,list provided for foods that can be helpful for pain -Discussed low impact progressive exercise -Discussed trying yoga or tai chi -Consider Referral for aquatic therapy- limited by her schedule -Tens Unit recommended, she plans to get one on her own -Continue walking for exercise -Xray L knee ordered to check for extent of suspected OA -She would like to hold off medication changes today, she said she is not a  medication person -Consider trying Lyrica or Cymbalta at a later time

## 2023-08-12 ENCOUNTER — Other Ambulatory Visit: Payer: Self-pay

## 2023-08-12 ENCOUNTER — Other Ambulatory Visit: Payer: Self-pay | Admitting: Physician Assistant

## 2023-08-12 ENCOUNTER — Other Ambulatory Visit (HOSPITAL_COMMUNITY): Payer: Self-pay

## 2023-08-12 DIAGNOSIS — E559 Vitamin D deficiency, unspecified: Secondary | ICD-10-CM

## 2023-08-12 DIAGNOSIS — F419 Anxiety disorder, unspecified: Secondary | ICD-10-CM

## 2023-08-12 MED ORDER — ESCITALOPRAM OXALATE 10 MG PO TABS
10.0000 mg | ORAL_TABLET | Freq: Every day | ORAL | 3 refills | Status: DC
  Filled 2023-08-12: qty 30, 30d supply, fill #0
  Filled 2023-09-11: qty 30, 30d supply, fill #1
  Filled 2023-10-28: qty 30, 30d supply, fill #2
  Filled 2023-12-08: qty 30, 30d supply, fill #3
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
## 2023-08-21 ENCOUNTER — Other Ambulatory Visit (HOSPITAL_COMMUNITY): Payer: Self-pay

## 2023-09-11 ENCOUNTER — Other Ambulatory Visit: Payer: Self-pay

## 2023-09-11 ENCOUNTER — Other Ambulatory Visit: Payer: Self-pay | Admitting: Physician Assistant

## 2023-09-11 ENCOUNTER — Other Ambulatory Visit (HOSPITAL_COMMUNITY): Payer: Self-pay

## 2023-09-11 DIAGNOSIS — E559 Vitamin D deficiency, unspecified: Secondary | ICD-10-CM

## 2023-09-11 MED ORDER — VITAMIN D (ERGOCALCIFEROL) 1.25 MG (50000 UNIT) PO CAPS
50000.0000 [IU] | ORAL_CAPSULE | ORAL | 0 refills | Status: DC
  Filled 2023-09-11: qty 4, 28d supply, fill #0
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
## 2023-09-18 ENCOUNTER — Encounter: Payer: Self-pay | Admitting: Internal Medicine

## 2023-09-18 ENCOUNTER — Ambulatory Visit: Attending: Internal Medicine | Admitting: Internal Medicine

## 2023-09-18 ENCOUNTER — Ambulatory Visit (INDEPENDENT_AMBULATORY_CARE_PROVIDER_SITE_OTHER)

## 2023-09-18 VITALS — BP 117/76 | HR 83 | Resp 16 | Ht 61.0 in | Wt 217.0 lb

## 2023-09-18 DIAGNOSIS — G8929 Other chronic pain: Secondary | ICD-10-CM | POA: Diagnosis not present

## 2023-09-18 DIAGNOSIS — R768 Other specified abnormal immunological findings in serum: Secondary | ICD-10-CM | POA: Diagnosis not present

## 2023-09-18 DIAGNOSIS — M546 Pain in thoracic spine: Secondary | ICD-10-CM | POA: Diagnosis not present

## 2023-09-18 DIAGNOSIS — M25562 Pain in left knee: Secondary | ICD-10-CM

## 2023-09-18 NOTE — Patient Instructions (Signed)
 I recommend checking out the [place] [facility] [place] patient-centered guide for fibromyalgia and chronic pain management: [email or web address]

## 2023-09-21 LAB — PROTEIN ELECTROPHORESIS, SERUM, WITH REFLEX
Albumin ELP: 4.3 g/dL (ref 3.8–4.8)
Alpha 1: 0.3 g/dL (ref 0.2–0.3)
Alpha 2: 0.7 g/dL (ref 0.5–0.9)
Beta 2: 0.5 g/dL (ref 0.2–0.5)
Beta Globulin: 0.5 g/dL (ref 0.4–0.6)
Gamma Globulin: 1.3 g/dL (ref 0.8–1.7)
Total Protein: 7.7 g/dL (ref 6.1–8.1)

## 2023-09-21 LAB — CK: Total CK: 199 U/L (ref 20–243)

## 2023-09-21 LAB — SEDIMENTATION RATE: Sed Rate: 45 mm/h — ABNORMAL HIGH (ref 0–30)

## 2023-09-21 LAB — C3 AND C4
C3 Complement: 161 mg/dL (ref 83–193)
C4 Complement: 28 mg/dL (ref 15–57)

## 2023-09-21 LAB — C-REACTIVE PROTEIN: CRP: 3.8 mg/L (ref <8.0)

## 2023-10-28 ENCOUNTER — Other Ambulatory Visit: Payer: Self-pay

## 2023-10-28 ENCOUNTER — Telehealth: Payer: Self-pay | Admitting: Nurse Practitioner

## 2023-10-28 NOTE — Telephone Encounter (Signed)
 Pt confirmed appt 9/26 (per vr )

## 2023-10-30 ENCOUNTER — Other Ambulatory Visit: Payer: Self-pay | Admitting: Physical Medicine & Rehabilitation

## 2023-10-30 ENCOUNTER — Ambulatory Visit (HOSPITAL_COMMUNITY)
Admission: RE | Admit: 2023-10-30 | Discharge: 2023-10-30 | Disposition: A | Discharge status: Home / Self Care | Source: Ambulatory Visit | Attending: Physical Medicine & Rehabilitation | Admitting: Physical Medicine & Rehabilitation

## 2023-10-30 ENCOUNTER — Encounter: Payer: Self-pay | Admitting: Nurse Practitioner

## 2023-10-30 ENCOUNTER — Encounter: Payer: Self-pay | Admitting: Physical Medicine & Rehabilitation

## 2023-10-30 ENCOUNTER — Encounter: Attending: Physical Medicine & Rehabilitation | Admitting: Physical Medicine & Rehabilitation

## 2023-10-30 ENCOUNTER — Ambulatory Visit: Attending: Nurse Practitioner | Admitting: Nurse Practitioner

## 2023-10-30 VITALS — BP 123/72 | HR 82 | Ht 61.0 in | Wt 218.0 lb

## 2023-10-30 VITALS — BP 123/85 | HR 94 | Ht 61.0 in | Wt 218.4 lb

## 2023-10-30 DIAGNOSIS — R5383 Other fatigue: Secondary | ICD-10-CM

## 2023-10-30 DIAGNOSIS — Z23 Encounter for immunization: Secondary | ICD-10-CM

## 2023-10-30 DIAGNOSIS — M542 Cervicalgia: Secondary | ICD-10-CM

## 2023-10-30 DIAGNOSIS — M797 Fibromyalgia: Secondary | ICD-10-CM | POA: Insufficient documentation

## 2023-10-30 DIAGNOSIS — M546 Pain in thoracic spine: Secondary | ICD-10-CM | POA: Insufficient documentation

## 2023-10-30 DIAGNOSIS — M47814 Spondylosis without myelopathy or radiculopathy, thoracic region: Secondary | ICD-10-CM | POA: Diagnosis not present

## 2023-10-30 DIAGNOSIS — E559 Vitamin D deficiency, unspecified: Secondary | ICD-10-CM

## 2023-10-30 DIAGNOSIS — Z1231 Encounter for screening mammogram for malignant neoplasm of breast: Secondary | ICD-10-CM

## 2023-10-30 DIAGNOSIS — Z79899 Other long term (current) drug therapy: Secondary | ICD-10-CM

## 2023-10-30 DIAGNOSIS — H9201 Otalgia, right ear: Secondary | ICD-10-CM

## 2023-10-30 DIAGNOSIS — I10 Essential (primary) hypertension: Secondary | ICD-10-CM | POA: Diagnosis not present

## 2023-10-30 DIAGNOSIS — G8929 Other chronic pain: Secondary | ICD-10-CM | POA: Insufficient documentation

## 2023-10-30 DIAGNOSIS — R7303 Prediabetes: Secondary | ICD-10-CM

## 2023-10-30 DIAGNOSIS — M545 Low back pain, unspecified: Secondary | ICD-10-CM | POA: Insufficient documentation

## 2023-10-30 DIAGNOSIS — M25562 Pain in left knee: Secondary | ICD-10-CM | POA: Insufficient documentation

## 2023-10-30 DIAGNOSIS — M47816 Spondylosis without myelopathy or radiculopathy, lumbar region: Secondary | ICD-10-CM | POA: Diagnosis not present

## 2023-10-30 DIAGNOSIS — R0602 Shortness of breath: Secondary | ICD-10-CM | POA: Diagnosis not present

## 2023-10-30 DIAGNOSIS — M47812 Spondylosis without myelopathy or radiculopathy, cervical region: Secondary | ICD-10-CM | POA: Diagnosis not present

## 2023-10-30 NOTE — Progress Notes (Signed)
 Subjective:    Patient ID: Nina Hopkins, female    DOB: 1960/12/08, 63 y.o.   MRN: [id number]  HPI  HPI  Nina Hopkins is a 63 y.o. year old female  who  has a past medical history of Allergy, Anemia, Anxiety (~ 2008), Arthritis, Depression, Dyspnea, Family history of adverse reaction to anesthesia, History of blood transfusion (12/20/2014), Hyperlipidemia, Hypertension, Missed ab, and SVD (spontaneous vaginal delivery).   They are presenting to PM&R clinic as a new patient for pain management evaluation. They were referred by Haze Servant NP for treatment of Fibromyalgia pain.  Thank you for the referral of this very nice patient Patient reports she developed spasms in her upper back when she was in her 30s.  More recently she has been having pain in her right lower back pain in her right lower back, she feels like this is muscle pain.  She also reports discomfort in her left ankle and left wrist and she reports these joints often feel unsteady.  For the past few months she has been having a lot of pain in her left arm.  She also has left knee pain and this area will swell when the weather is bad.  Patient reports frequent fatigue and difficulty focusing.  Pain is not constant and comes and goes in different areas of her body.  Sometimes symptoms will last for few days sometimes weeks.  Pain is overall worse on the left side of her body although she will often have pain on the right side of her body too.  Pain is burning in quality frequently, other times it is achy.  Sometimes she has pain due to muscle spasms.  Her mother had fibromyalgia, Lupus, RA.   She reports Haze Servant had discussed where she was felt to have fibromyalgia.  She has a hx of anxiety and depresison on lexapro .   She has been referred for rheumatology and has a visit Aug 15.   No difficulty sleeping, she says she sleeps 6 hours a night. Denies bowel difficulty.   She likes to walk for  exercise  She was seen by ortho on 04/07/23 for ankle instability.  Exam was felt to be benign.   Medications tried: Topical medications- Arthritis cream- helps a little  Nsaids Ibuprofen  helped pain- stopped this when she started lexapro . She is now using tylenol  Tylenol  - helps a little Opiates- only for after surgery  Gabapentin  - Helped with burning pain, not dull pain. She is not sure why she stopped, maybe got sick from it? TCAs  - Denies  SNRIs  - Denies   Other treatments: PT- helped a little Accupuncture/chiropractor/massage - Denies  TENs unit - Denies, Denies personal history of cancer  Injections - denies  Surgery - Denies relevant  Heat helps    10/30/23 interval history  Reports worsening back pain since last visit, which is now the primary issue. Describes spasms and a burning pain in the upper back/lower neck and between the shoulder blades, and a constant, burning ache in the lower back, which has been present for years but has worsened recently. The lower back pain is localized to the right side and does not radiate down the legs.  Also reports new-onset soreness in the left axillary area for the past four days. The left wrist is described as raggedy. Knees and ankles are less problematic. The right arm is not a significant issue. Leg pain is minimal and primarily associated with weather changes, without  radiation from the back.  Continues to avoid new medications. Uses ibuprofen  occasionally for more difficult pain days, which provides some relief.  Has been more active, performing stretches and walking more via walk videos. Has also been doing stretches from [employer]. Has not tried yoga. Has a TENS unit but has not used it due to concerns about contraindications listed in the manual.  Reports making dietary changes, including eating more fruits and vegetables and drinking coffee black without sugar or cream.  Still unable to attend formal physical  therapy due to work schedule (starts at 7:30 AM, daughter is not home until 6 PM, Monday-Friday).  Expresses concern about the progression of back symptoms and requests imaging to investigate.   Prior UDS results: No results found for: LABOPIA, COCAINSCRNUR, LABBENZ, AMPHETMU, THCU, LABBARB  Pain Inventory Average Pain 6 Pain Right Now 6 My pain is intermittent, sharp, burning, dull, and aching  In the last 24 hours, has pain interfered with the following? General activity 4 Relation with others 0 Enjoyment of life 3 What TIME of day is your pain at its worst? daytime and varies Sleep (in general) Fair  Pain is worse with: walking, bending, inactivity, and some activites Pain improves with: heat/ice, therapy/exercise, pacing activities, and medication Relief from Meds: 5  walk without assistance how many minutes can you walk? Depends on how I am feeling ability to climb steps?  yes do you drive?  no Do you have any goals in this area?  yes, control pain  employed # of hrs/week 40 + hours a week  bladder control problems numbness tingling trouble walking spasms depression anxiety  Any changes since last visit?  no  Primary care Haze Servant, PCP Rheumatologist Referred to Rheumatology consult September 18, 2023 Psychologist Starting therapy    Family History  Problem Relation Age of Onset   Rheum arthritis Mother    Cancer Mother        BRAIN TUMOR, VULVAR   Hypertension Mother    Diabetes Mother    Lupus Mother    Cancer Father        PENILE   Diabetes Sister    Hypertension Sister    Cancer Maternal Grandmother        LUNG   Colon cancer Maternal Grandfather    Cancer Maternal Grandfather        COLON   Healthy Daughter    Healthy Daughter    Esophageal cancer Maternal Uncle    Rectal cancer Neg Hx    Stomach cancer Neg Hx    Social History   Socioeconomic History   Marital status: Divorced    Spouse name: Not on file   Number of  children: 2   Years of education: Not on file   Highest education level: 12th grade  Occupational History   Not on file  Tobacco Use   Smoking status: Never    Passive exposure: Past   Smokeless tobacco: Never  Vaping Use   Vaping status: Never Used  Substance and Sexual Activity   Alcohol use: Not Currently    Comment: Wine   Drug use: No   Sexual activity: Not Currently    Birth control/protection: None  Other Topics Concern   Not on file  Social History Narrative   Not on file   Social Drivers of Health   Financial Resource Strain: Medium Risk (10/29/2023)   Overall Financial Resource Strain (CARDIA)    Difficulty of Paying Living Expenses: Somewhat hard  Food Insecurity: Food Insecurity Present (10/29/2023)   Hunger Vital Sign    Worried About Running Out of Food in the Last Year: Sometimes true    Ran Out of Food in the Last Year: Sometimes true  Transportation Needs: No Transportation Needs (10/29/2023)   PRAPARE - Administrator, Civil Service (Medical): No    Lack of Transportation (Non-Medical): No  Physical Activity: Insufficiently Active (10/29/2023)   Exercise Vital Sign    Days of Exercise per Week: 2 days    Minutes of Exercise per Session: 20 min  Stress: Stress Concern Present (10/29/2023)   [employer] of Occupational Health - Occupational Stress Questionnaire    Feeling of Stress: To some extent  Social Connections: Socially Isolated (10/29/2023)   Social Connection and Isolation Panel    Frequency of Communication with Friends and Family: Three times a week    Frequency of Social Gatherings with Friends and Family: More than three times a week    Attends Religious Services: Never    Database administrator or Organizations: No    Attends Engineer, structural: Not on file    Marital Status: Divorced   Past Surgical History:  Procedure Laterality Date   ABDOMINAL HYSTERECTOMY N/A 03/17/2017   Procedure: HYSTERECTOMY ABDOMINAL;   Surgeon: Starla Harland BROCKS, MD;  Location: [facility];  Service: Gynecology;  Laterality: N/A;   CERVIX LESION DESTRUCTION     cryro   CYSTOSCOPY N/A 03/17/2017   Procedure: CYSTOSCOPY;  Surgeon: Starla Harland BROCKS, MD;  Location: [facility];  Service: Gynecology;  Laterality: N/A;   DILATION AND CURETTAGE, DIAGNOSTIC / THERAPEUTIC      in office   SALPINGOOPHORECTOMY Bilateral 03/17/2017   Procedure: SALPINGO OOPHORECTOMY;  Surgeon: Starla Harland BROCKS, MD;  Location: [facility];  Service: Gynecology;  Laterality: Bilateral;   TUBAL LIGATION  1990's   Past Medical History:  Diagnosis Date   Allergy    Anemia    Anxiety ~ 2008   no meds   Arthritis    Depression    Dyspnea    hx - r/t anemia per patient   Family history of adverse reaction to anesthesia    my sister had a hard time waking up   History of blood transfusion 12/20/2014   related to anemia   Hyperlipidemia    Hypertension    Missed ab    x 1 - no surgery required   SVD (spontaneous vaginal delivery)    x 2   BP 123/72   Pulse 82   Ht 5' 1 (1.549 m)   Wt 218 lb (98.9 kg)   LMP 03/02/2017 (Approximate)   SpO2 100%   BMI 41.19 kg/m   Opioid Risk Score:   Fall Risk Score:  `1  Depression screen [facility] 2/9     10/30/2023    9:17 AM 07/31/2023    9:54 AM 07/31/2023    9:53 AM 02/26/2023    9:44 AM 10/29/2022    4:00 PM 04/18/2022   10:25 AM 01/17/2022    1:29 PM  Depression screen PHQ 2/9  Decreased Interest 1 0 0 2 1 0 0  Down, Depressed, Hopeless 1 1 1 3 1  0 0  PHQ - 2 Score 2 1 1 5 2  0 0  Altered sleeping  1  1 0 0 3  Tired, decreased energy  2  2 0 0 1  Change in appetite  2  3 3  0 1  Feeling  bad or failure about yourself   2  3 2 2  0  Trouble concentrating  1  3 3 2 2   Moving slowly or fidgety/restless  0  0 0 1 0  Suicidal thoughts  0  0 0 0 0  PHQ-9 Score  9  17 10 5 7   Difficult doing work/chores  Not difficult at all           Review of Systems  Constitutional:  Negative for unexpected weight change.       Weight  gain  Respiratory:  Negative for cough and shortness of breath.        History of Respiratory infections  Cardiovascular:  Negative for leg swelling.       Leg swelling of left leg and knee (intermittent swelling of knee and lower extremity)  Genitourinary:  Negative for frequency.       Coughing or sneezing can cause patient to leak urine  Musculoskeletal:  Positive for back pain. Negative for joint swelling and myalgias.       Left arm pain, right hip pain  Neurological:  Negative for weakness and numbness.       Trouble walking, weakness, numbness  Psychiatric/Behavioral:         Depression and anxiety  All other systems reviewed and are negative.  05/06/2017 xray L spine  IMPRESSION: Mild degenerative change with 3 mm anterolisthesis L4 on L5. No acute abnormality.    L shoulder xray 2022 FINDINGS: There is no evidence of fracture or dislocation. There is no evidence of arthropathy or other focal bone abnormality. Soft tissues are unremarkable.   IMPRESSION: Negative.  R ankle xray 04/07/23 This of her right ankle demonstrate no evidence of fracture  well-maintained alignment no significant degenerative changes she does  have spurring at both the plantar and posterior aspect of the calcaneus      Objective:   Physical Exam  Gen: no distress, normal appearing HEENT: oral mucosa pink and moist, NCAT Chest: normal effort, normal rate of breathing Abd: soft, non-distended Ext: no edema Psych: pleasant, normal affect Skin: intact Neuro: Alert and awake, follows commands, cranial nerves II through XII grossly intact, normal speech and language RUE: 5/5 Deltoid, 5/5 Biceps, 5/5 Triceps, 5/5 Wrist Ext, 5/5 Grip LUE: 5/5 Deltoid, 5/5 Biceps, 5/5 Triceps, 5/5 Wrist Ext, 5/5 Grip RLE: HF 5/5, KE 5/5, ADF 5/5, APF 5/5 LLE: HF 5/5, KE 5/5, ADF 5/5, APF 5/5 Sensory exam normal for light touch and pain in all 4 limbs. No limb ataxia or cerebellar signs. No abnormal tone  appreciated.  No abnormal tone noted Musculoskeletal:  - Back:No significant tenderness to palpation in the upper thoracic spine/rhomboid area, despite reports of burning pain and spasms there. Palpation over the lower back does not elicit significant pain, but reports tenderness over the right side. Reports pain with movement such as bending or rising from a seated position. - Cervical Spine: Reports mild pain with neck extension. No significant tenderness to palpation of the trapezius muscles. - Left Axilla: Reports soreness to palpation. - Knees: Mild L knee TTP   Xray L knee Mild medial compartment joint space narrowing lateral compartment appears  well-preserved.  There is lateral patellofemoral compartment bone  spurring. Superior and inferior patellar enthesophytes.  No abnormal  calcifications seen.  No visible joint effusion.   Impression   Medial and patellofemoral compartment osteoarthritis, large patellar  enthesophytes suggestive for chronic tendinopathy     Assessment & Plan:  1) Fibromyalgia - I agree that her symptoms do seem consistent with fibromyalgia.  2) Depression/Anxiety, no SI or HI.  She is on Lexapro   3) L knee and ankle pain  4) Possibly sleep disorder, reports she sleeps okay overall but only about  6 hours a night  5) Chronic back and neck pain  -imaging ordered  -Discussed low impact progressive exercise -Discussed trying tai chi again -Discussed Referral for aquatic therapy- limited by her schedule -Tens Unit recommended, she has one but has not tried it, discussed giving this a try and discussed use of this device -Continue walking for exercise -Xray L knee with OA- pt not interested in injection -She would like to hold off medication changes today, she said she is not a  medication person -Consider trying Lyrica or Cymbalta at a later time - Ordered x-rays of the cervical, thoracic, and lumbar spine to be performed at [facility]  to evaluate worsening back and neck symptoms. Patient advised to call the facility to confirm hours before going. - Discussed Voltaren gel as a topical alternative to oral NSAIDs, but patient reports it was not effective in the past and prefers a different over-the-counter topical product.

## 2023-10-30 NOTE — Progress Notes (Signed)
 Assessment & Plan:  Nina Hopkins was seen today for medical management of chronic issues.  Diagnoses and all orders for this visit:  Primary hypertension Continue chlorthalidone  as prescribed.  Reminded to bring in blood pressure log for follow  up appointment.  RECOMMENDATIONS: DASH/Mediterranean Diets are healthier choices for HTN.    Vitamin D  deficiency disease -     VITAMIN D  25 Hydroxy (Vit-D Deficiency, Fractures)  Breast cancer screening by mammogram -     MM 3D SCREENING MAMMOGRAM BILATERAL BREAST; Future  Shortness of breath -     Thyroid  Panel With TSH -     DG Chest 2 View; Future Cough and breath stuttering following recent pneumonia Persistent cough and breath stuttering post-pneumonia. No current respiratory infection indicated by normal CBC. - Order chest x-ray.  Flu vaccine need -     Flu vaccine trivalent PF, 6mos and older(Flulaval,Afluria,Fluarix,Fluzone)  Prediabetes -     Hemoglobin A1c  Fatigue, hair loss, and scalp pruritus  possibly due to thyroid  dysfunction, nutritional deficiencies, or stress. Chlorthalidone  may contribute to hair loss. Anemia unlikely due to post-menopausal status. - Recheck thyroid  function tests. - Consider biotin supplementation. - Evaluate for nutritional deficiencies.  Right ear pain, intermittent, likely related to allergic rhinitis Intermittent right ear pain. No fluid observed in ear. - Increase Flonase  use during episodes of ear pain.  General Health Maintenance Due for flu vaccine and screening mammogram. - Administer flu vaccine today. - Schedule screening mammogram for November. - Provide breast clinic contact for scheduling.  Patient has been counseled on age-appropriate routine health concerns for screening and prevention. These are reviewed and up-to-date. Referrals have been placed accordingly. Immunizations are up-to-date or declined.    Subjective:   Chief Complaint  Patient presents with   Medical  Management of Chronic Issues    Fatigue Scalp itching    Nina Hopkins 63 y.o. female presents to office today for follow up to HTN with concerns of fatigue and hair thinning  She has a past medical history of Anemia, Anxiety (~ 2008), Dyspnea,  Hyperlipidemia, Hypertension   History of Present Illness Nina Hopkins is a 63 year old female who presents with fatigue, scalp itching, and hair loss.   HTN Blood pressure at goal with chlorthalidone  BP Readings from Last 3 Encounters:  10/30/23 123/85  10/30/23 123/72  09/18/23 117/76    She experiences significant fatigue, scalp itching, and hair loss, including the loss of hair on her eyebrows and thinning hair on her scalp. She states her eyebrows disappeared overnight, particularly at the ends. She is concerned about potential thyroid  issues contributing to her symptoms, although her thyroid  was checked in March and was normal. She also considers stress as a potential factor but notes she has been more stressed in the past without experiencing hair loss.  She has a history of a complete hysterectomy. She is not solely concerned about hair loss but is worried about the combination of fatigue, pain, and other symptoms. She inquired about checking her hormone levels.  She experiences intermittent right ear pain, described as feeling her heartbeat in her ear. She has a history of allergy symptoms and uses Flonase  as needed, but not routinely. She does not report any current sinus congestion.  She has had a 'stuttered breath' sensation for quite some time. Diagnosed with pneumonia in May, and still experiencing a persistent cough. She is concerned about the increasing frequency of the stuttered breath.   She inquires about her vitamin  D supplementation, noting her last level was 41. She is unsure if she needs to continue taking vitamin D  supplements.   Review of Systems  Constitutional:  Negative for fever,  malaise/fatigue and weight loss.  HENT:  Positive for ear pain. Negative for nosebleeds.   Eyes: Negative.  Negative for blurred vision, double vision and photophobia.  Respiratory:  Positive for shortness of breath. Negative for cough.   Cardiovascular: Negative.  Negative for chest pain, palpitations and leg swelling.  Gastrointestinal: Negative.  Negative for heartburn, nausea and vomiting.  Musculoskeletal: Negative.  Negative for myalgias.  Skin:        HAIRLOSS  Neurological: Negative.  Negative for dizziness, focal weakness, seizures and headaches.  Psychiatric/Behavioral: Negative.  Negative for suicidal ideas.     Past Medical History:  Diagnosis Date   Allergy    Anemia    Anxiety ~ 2008   no meds   Arthritis    Depression    Dyspnea    hx - r/t anemia per patient   Family history of adverse reaction to anesthesia    my sister had a hard time waking up   History of blood transfusion 12/20/2014   related to anemia   Hyperlipidemia    Hypertension    Missed ab    x 1 - no surgery required   SVD (spontaneous vaginal delivery)    x 2    Past Surgical History:  Procedure Laterality Date   ABDOMINAL HYSTERECTOMY N/A 03/17/2017   Procedure: HYSTERECTOMY ABDOMINAL;  Surgeon: Starla Harland BROCKS, MD;  Location: [facility];  Service: Gynecology;  Laterality: N/A;   CERVIX LESION DESTRUCTION     cryro   CYSTOSCOPY N/A 03/17/2017   Procedure: CYSTOSCOPY;  Surgeon: Starla Harland BROCKS, MD;  Location: [facility];  Service: Gynecology;  Laterality: N/A;   DILATION AND CURETTAGE, DIAGNOSTIC / THERAPEUTIC      in office   SALPINGOOPHORECTOMY Bilateral 03/17/2017   Procedure: SALPINGO OOPHORECTOMY;  Surgeon: Starla Harland BROCKS, MD;  Location: [facility];  Service: Gynecology;  Laterality: Bilateral;   TUBAL LIGATION  1990's    Family History  Problem Relation Age of Onset   Rheum arthritis Mother    Cancer Mother        BRAIN TUMOR, VULVAR   Hypertension Mother    Diabetes Mother    Lupus Mother     Cancer Father        PENILE   Diabetes Sister    Hypertension Sister    Cancer Maternal Grandmother        LUNG   Colon cancer Maternal Grandfather    Cancer Maternal Grandfather        COLON   Healthy Daughter    Healthy Daughter    Esophageal cancer Maternal Uncle    Rectal cancer Neg Hx    Stomach cancer Neg Hx     Social History Reviewed with no changes to be made today.   Outpatient Medications Prior to Visit  Medication Sig Dispense Refill   albuterol  (VENTOLIN  HFA) 108 (90 Base) MCG/ACT inhaler Inhale 1-2 puffs into the lungs every 6 (six) hours as needed for wheezing or shortness of breath. 8.5 g 0   chlorthalidone  (HYGROTON ) 25 MG tablet Take 1 tablet (25 mg total) by mouth daily. 90 tablet 1   escitalopram  (LEXAPRO ) 10 MG tablet Take 1 tablet (10 mg total) by mouth daily. 30 tablet 3   Vitamin D , Ergocalciferol , (DRISDOL ) 1.25 MG (50000 UNIT) CAPS capsule Take  1 capsule (50,000 Units total) by mouth every 7 (seven) days. 4 capsule 0   No facility-administered medications prior to visit.    Allergies  Allergen Reactions   Prilosec [Omeprazole]     Stiff neck       Objective:    BP 123/85   Pulse 94   Ht 5' 1 (1.549 m)   Wt 218 lb 6.4 oz (99.1 kg)   LMP 03/02/2017 (Approximate)   SpO2 95%   BMI 41.27 kg/m  Wt Readings from Last 3 Encounters:  10/30/23 218 lb 6.4 oz (99.1 kg)  10/30/23 218 lb (98.9 kg)  09/18/23 217 lb (98.4 kg)    Physical Exam Vitals and nursing note reviewed.  Constitutional:      Appearance: She is well-developed.  HENT:     Head: Normocephalic and atraumatic.     Right Ear: Hearing, tympanic membrane, ear canal and external ear normal.     Left Ear: Hearing, tympanic membrane, ear canal and external ear normal.  Cardiovascular:     Rate and Rhythm: Normal rate and regular rhythm.     Heart sounds: Normal heart sounds. No murmur heard.    No friction rub. No gallop.  Pulmonary:     Effort: Pulmonary effort is normal. No  tachypnea or respiratory distress.     Breath sounds: Normal breath sounds. No decreased breath sounds, wheezing, rhonchi or rales.  Chest:     Chest wall: No tenderness.  Musculoskeletal:        General: Normal range of motion.     Cervical back: Normal range of motion.  Skin:    General: Skin is warm and dry.  Neurological:     Mental Status: She is alert and oriented to person, place, and time.     Coordination: Coordination normal.  Psychiatric:        Behavior: Behavior normal. Behavior is cooperative.        Thought Content: Thought content normal.        Judgment: Judgment normal.          Patient has been counseled extensively about nutrition and exercise as well as the importance of adherence with medications and regular follow-up. The patient was given clear instructions to go to ER or return to medical center if symptoms don't improve, worsen or new problems develop. The patient verbalized understanding.   Follow-up: Return in about 4 months (around 02/29/2024) for FASTING labs and Physical.   Haze LELON Servant, FNP-BC [facility] [place], [place] [phone]   10/30/2023, 4:29 PM

## 2023-10-31 LAB — VITAMIN D 25 HYDROXY (VIT D DEFICIENCY, FRACTURES): Vit D, 25-Hydroxy: 23.1 ng/mL — ABNORMAL LOW (ref 30.0–100.0)

## 2023-10-31 LAB — THYROID PANEL WITH TSH
Free Thyroxine Index: 1.5 (ref 1.2–4.9)
T3 Uptake Ratio: 27 % (ref 24–39)
T4, Total: 5.6 ug/dL (ref 4.5–12.0)
TSH: 1.5 u[IU]/mL (ref 0.450–4.500)

## 2023-10-31 LAB — HEMOGLOBIN A1C
Est. average glucose Bld gHb Est-mCnc: 131 mg/dL
Hgb A1c MFr Bld: 6.2 % — ABNORMAL HIGH (ref 4.8–5.6)

## 2023-11-04 ENCOUNTER — Other Ambulatory Visit: Payer: Self-pay | Admitting: Nurse Practitioner

## 2023-11-04 ENCOUNTER — Ambulatory Visit: Payer: Self-pay | Admitting: Nurse Practitioner

## 2023-11-04 DIAGNOSIS — E559 Vitamin D deficiency, unspecified: Secondary | ICD-10-CM

## 2023-11-04 MED ORDER — VITAMIN D (ERGOCALCIFEROL) 1.25 MG (50000 UNIT) PO CAPS
50000.0000 [IU] | ORAL_CAPSULE | ORAL | 0 refills | Status: AC
  Filled 2023-11-04 – 2023-11-05 (×2): qty 9, 63d supply, fill #0

## 2023-11-05 ENCOUNTER — Other Ambulatory Visit: Payer: Self-pay

## 2023-11-05 ENCOUNTER — Other Ambulatory Visit (HOSPITAL_COMMUNITY): Payer: Self-pay

## 2023-12-01 ENCOUNTER — Encounter: Payer: Self-pay | Admitting: Internal Medicine

## 2023-12-01 ENCOUNTER — Ambulatory Visit: Attending: Internal Medicine | Admitting: Internal Medicine

## 2023-12-01 VITALS — BP 133/81 | HR 70 | Temp 97.7°F | Resp 16 | Ht 61.0 in | Wt 221.6 lb

## 2023-12-01 DIAGNOSIS — M25562 Pain in left knee: Secondary | ICD-10-CM | POA: Diagnosis not present

## 2023-12-01 DIAGNOSIS — R7689 Other specified abnormal immunological findings in serum: Secondary | ICD-10-CM | POA: Insufficient documentation

## 2023-12-01 DIAGNOSIS — G8929 Other chronic pain: Secondary | ICD-10-CM | POA: Insufficient documentation

## 2023-12-01 DIAGNOSIS — M546 Pain in thoracic spine: Secondary | ICD-10-CM | POA: Insufficient documentation

## 2023-12-01 DIAGNOSIS — F419 Anxiety disorder, unspecified: Secondary | ICD-10-CM | POA: Insufficient documentation

## 2023-12-01 MED ORDER — CELECOXIB 200 MG PO CAPS: 200.0000 mg | ORAL_CAPSULE | Freq: Two times a day (BID) | ORAL | 2 refills | Status: AC | PRN

## 2023-12-01 NOTE — Patient Instructions (Signed)
 I recommend checking out the [place] [facility] [place] patient-centered guide for fibromyalgia and chronic pain management: [email or web address]
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
## 2023-12-07 ENCOUNTER — Encounter: Payer: Self-pay | Admitting: Radiology

## 2023-12-08 ENCOUNTER — Other Ambulatory Visit (HOSPITAL_COMMUNITY): Payer: Self-pay

## 2024-01-12 ENCOUNTER — Other Ambulatory Visit: Payer: Self-pay | Admitting: Family Medicine

## 2024-01-12 ENCOUNTER — Other Ambulatory Visit: Payer: Self-pay | Admitting: Nurse Practitioner

## 2024-01-12 DIAGNOSIS — F419 Anxiety disorder, unspecified: Secondary | ICD-10-CM

## 2024-01-12 DIAGNOSIS — I10 Essential (primary) hypertension: Secondary | ICD-10-CM

## 2024-01-13 ENCOUNTER — Other Ambulatory Visit (HOSPITAL_COMMUNITY): Payer: Self-pay

## 2024-01-13 MED ORDER — ESCITALOPRAM OXALATE 10 MG PO TABS
10.0000 mg | ORAL_TABLET | Freq: Every day | ORAL | 3 refills | Status: AC
  Filled 2024-01-13 – 2024-02-18 (×2): qty 30, 30d supply, fill #0
  Filled 2024-02-18: qty 30, 30d supply, fill #1

## 2024-01-13 MED ORDER — CHLORTHALIDONE 25 MG PO TABS
25.0000 mg | ORAL_TABLET | Freq: Every day | ORAL | 0 refills | Status: DC
  Filled 2024-01-13: qty 90, 90d supply, fill #0

## 2024-02-08 ENCOUNTER — Encounter: Admitting: Physical Medicine & Rehabilitation

## 2024-02-17 ENCOUNTER — Telehealth: Payer: Self-pay | Admitting: Nurse Practitioner

## 2024-02-17 NOTE — Telephone Encounter (Signed)
 Contacted pt to confirmed appt (per vr)

## 2024-02-18 ENCOUNTER — Other Ambulatory Visit: Payer: Self-pay

## 2024-02-18 ENCOUNTER — Other Ambulatory Visit (HOSPITAL_BASED_OUTPATIENT_CLINIC_OR_DEPARTMENT_OTHER): Payer: Self-pay
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
## 2024-02-18 NOTE — Assessment & Plan Note (Signed)
 SABRA

## 2024-02-19 ENCOUNTER — Other Ambulatory Visit: Payer: Self-pay

## 2024-02-19 ENCOUNTER — Ambulatory Visit: Attending: Nurse Practitioner | Admitting: Nurse Practitioner

## 2024-02-19 ENCOUNTER — Encounter: Payer: Self-pay | Admitting: Nurse Practitioner

## 2024-02-19 VITALS — BP 117/76 | HR 97 | Ht 61.0 in | Wt 216.8 lb

## 2024-02-19 DIAGNOSIS — Z23 Encounter for immunization: Secondary | ICD-10-CM

## 2024-02-19 DIAGNOSIS — I10 Essential (primary) hypertension: Secondary | ICD-10-CM | POA: Diagnosis not present

## 2024-02-19 DIAGNOSIS — E7800 Pure hypercholesterolemia, unspecified: Secondary | ICD-10-CM | POA: Diagnosis not present

## 2024-02-19 DIAGNOSIS — E559 Vitamin D deficiency, unspecified: Secondary | ICD-10-CM

## 2024-02-19 DIAGNOSIS — J984 Other disorders of lung: Secondary | ICD-10-CM

## 2024-02-19 MED ORDER — ALBUTEROL SULFATE HFA 108 (90 BASE) MCG/ACT IN AERS
1.0000 | INHALATION_SPRAY | Freq: Four times a day (QID) | RESPIRATORY_TRACT | 1 refills | Status: AC | PRN
  Filled 2024-02-19 (×2): qty 6.7, 25d supply, fill #0

## 2024-02-19 MED ORDER — CHLORTHALIDONE 25 MG PO TABS
25.0000 mg | ORAL_TABLET | Freq: Every day | ORAL | 1 refills | Status: AC
  Filled 2024-02-19: qty 90, 90d supply, fill #0

## 2024-02-19 NOTE — Patient Instructions (Signed)
 [facility] Located in: [facility] Address: [address] [place], [place] [address] Phone: [phone]

## 2024-02-19 NOTE — Progress Notes (Signed)
 "  Assessment & Plan:  Nina was seen today for hypertension.  Diagnoses and all orders for this visit:  Primary hypertension -     chlorthalidone  (HYGROTON ) 25 MG tablet; Take 1 tablet (25 mg total) by mouth daily. -     CMP14+EGFR  Restrictive airway disease Uses inhaler sparingly -     albuterol  (VENTOLIN  HFA) 108 (90 Base) MCG/ACT inhaler; Inhale 1-2 puffs into the lungs every 6 (six) hours as needed for wheezing or shortness of breath.  Need for pneumococcal 20-valent conjugate vaccination -     Pneumococcal conjugate vaccine 20-valent  Vitamin D  deficiency disease Take VIT D 2000-5000 units daily -     VITAMIN D  25 Hydroxy (Vit-D Deficiency, Fractures)  Hypercholesterolemia -     Lipid panel    Patient has been counseled on age-appropriate routine health concerns for screening and prevention. These are reviewed and up-to-date. Referrals have been placed accordingly. Immunizations are up-to-date or declined.    Subjective:   Chief Complaint  Patient presents with   Hypertension    Nina Hopkins 64 y.o. female presents to office today for follow up to HTN  She has a past medical history of Anemia, Anxiety (~ 2008), Dyspnea, Hyperlipidemia, Hypertension   HTN Blood pressure is well controlled with chlorthalidone . BP Readings from Last 3 Encounters:  02/19/24 117/76  12/01/23 133/81  10/30/23 123/85    She is overdue for mammogram. States she received a message on mychart from her insurance company [employer] that an annual mammogram was not covered. I do not see this message in her mychart.  She was given the number to the breast center to schedule  She has questions regarding MVI and if taken daily will satisfy her vitamin D  deficiency   Review of Systems  Constitutional:  Negative for fever, malaise/fatigue and weight loss.  HENT: Negative.  Negative for nosebleeds.   Eyes: Negative.  Negative for blurred vision, double vision and photophobia.   Respiratory: Negative.  Negative for cough and shortness of breath.   Cardiovascular: Negative.  Negative for chest pain, palpitations and leg swelling.  Gastrointestinal: Negative.  Negative for heartburn, nausea and vomiting.  Musculoskeletal: Negative.  Negative for myalgias.  Neurological: Negative.  Negative for dizziness, focal weakness, seizures and headaches.  Psychiatric/Behavioral: Negative.  Negative for suicidal ideas.     Past Medical History:  Diagnosis Date   Allergy    Anemia    Anxiety ~ 2008   no meds   Arthritis    Depression    Dyspnea    hx - r/t anemia per patient   Family history of adverse reaction to anesthesia    my sister had a hard time waking up   History of blood transfusion 12/20/2014   related to anemia   Hyperlipidemia    Hypertension    Missed ab    x 1 - no surgery required   SVD (spontaneous vaginal delivery)    x 2    Past Surgical History:  Procedure Laterality Date   ABDOMINAL HYSTERECTOMY N/A 03/17/2017   Procedure: HYSTERECTOMY ABDOMINAL;  Surgeon: Starla Harland BROCKS, MD;  Location: [facility];  Service: Gynecology;  Laterality: N/A;   CERVIX LESION DESTRUCTION     cryro   CYSTOSCOPY N/A 03/17/2017   Procedure: CYSTOSCOPY;  Surgeon: Starla Harland BROCKS, MD;  Location: [facility];  Service: Gynecology;  Laterality: N/A;   DILATION AND CURETTAGE, DIAGNOSTIC / THERAPEUTIC      in office   SALPINGOOPHORECTOMY  Bilateral 03/17/2017   Procedure: SALPINGO OOPHORECTOMY;  Surgeon: Starla Harland BROCKS, MD;  Location: [facility];  Service: Gynecology;  Laterality: Bilateral;   TUBAL LIGATION  1990's    Family History  Problem Relation Age of Onset   Rheum arthritis Mother    Cancer Mother        BRAIN TUMOR, VULVAR   Hypertension Mother    Diabetes Mother    Lupus Mother    Cancer Father        PENILE   Diabetes Sister    Hypertension Sister    Cancer Maternal Grandmother        LUNG   Colon cancer Maternal Grandfather    Cancer Maternal Grandfather         COLON   Healthy Daughter    Healthy Daughter    Esophageal cancer Maternal Uncle    Rectal cancer Neg Hx    Stomach cancer Neg Hx     Social History Reviewed with no changes to be made today.   Outpatient Medications Prior to Visit  Medication Sig Dispense Refill   escitalopram  (LEXAPRO ) 10 MG tablet Take 1 tablet (10 mg total) by mouth daily. 30 tablet 3   albuterol  (VENTOLIN  HFA) 108 (90 Base) MCG/ACT inhaler Inhale 1-2 puffs into the lungs every 6 (six) hours as needed for wheezing or shortness of breath. 8.5 g 0   chlorthalidone  (HYGROTON ) 25 MG tablet Take 1 tablet (25 mg total) by mouth daily. 90 tablet 0   celecoxib  (CELEBREX ) 200 MG capsule Take 1 capsule (200 mg total) by mouth every 12 (twelve) hours as needed. (Patient not taking: Reported on 02/19/2024) 60 capsule 2   Vitamin D , Ergocalciferol , (DRISDOL ) 1.25 MG (50000 UNIT) CAPS capsule Take 1 capsule (50,000 Units total) by mouth every 7 (seven) days. (Patient not taking: Reported on 02/19/2024) 9 capsule 0   No facility-administered medications prior to visit.    Allergies[1]     Objective:    BP 117/76 (BP Location: Left Arm, Patient Position: Sitting, Cuff Size: Normal)   Pulse 97   Ht 5' 1 (1.549 m)   Wt 216 lb 12.8 oz (98.3 kg)   LMP 03/02/2017   SpO2 99%   BMI 40.96 kg/m  Wt Readings from Last 3 Encounters:  02/19/24 216 lb 12.8 oz (98.3 kg)  12/01/23 221 lb 9.6 oz (100.5 kg)  10/30/23 218 lb 6.4 oz (99.1 kg)    Physical Exam Vitals and nursing note reviewed.  Constitutional:      Appearance: She is well-developed.  HENT:     Head: Normocephalic and atraumatic.  Cardiovascular:     Rate and Rhythm: Normal rate and regular rhythm.     Heart sounds: Normal heart sounds. No murmur heard.    No friction rub. No gallop.  Pulmonary:     Effort: Pulmonary effort is normal. No tachypnea or respiratory distress.     Breath sounds: Normal breath sounds. No decreased breath sounds, wheezing, rhonchi or  rales.  Chest:     Chest wall: No tenderness.  Musculoskeletal:        General: Normal range of motion.     Cervical back: Normal range of motion.  Skin:    General: Skin is warm and dry.  Neurological:     Mental Status: She is alert and oriented to person, place, and time.     Coordination: Coordination normal.  Psychiatric:        Behavior: Behavior normal. Behavior is cooperative.  Thought Content: Thought content normal.        Judgment: Judgment normal.          Patient has been counseled extensively about nutrition and exercise as well as the importance of adherence with medications and regular follow-up. The patient was given clear instructions to go to ER or return to medical center if symptoms don't improve, worsen or new problems develop. The patient verbalized understanding.   Follow-up: Return in about 4 months (around 06/18/2024).   Haze LELON Servant, FNP-BC [facility] [place], [place] [phone]   02/19/2024, 9:09 AM    [1]  Allergies Allergen Reactions   Prilosec [Omeprazole]     Stiff neck   "

## 2024-02-20 ENCOUNTER — Ambulatory Visit: Payer: Self-pay | Admitting: Nurse Practitioner

## 2024-02-20 LAB — CMP14+EGFR
ALT: 15 IU/L (ref 0–32)
AST: 17 IU/L (ref 0–40)
Albumin: 4.4 g/dL (ref 3.9–4.9)
Alkaline Phosphatase: 66 IU/L (ref 49–135)
BUN/Creatinine Ratio: 15 (ref 12–28)
BUN: 14 mg/dL (ref 8–27)
Bilirubin Total: 0.4 mg/dL (ref 0.0–1.2)
CO2: 27 mmol/L (ref 20–29)
Calcium: 9.9 mg/dL (ref 8.7–10.3)
Chloride: 100 mmol/L (ref 96–106)
Creatinine, Ser: 0.91 mg/dL (ref 0.57–1.00)
Globulin, Total: 3.1 g/dL (ref 1.5–4.5)
Glucose: 85 mg/dL (ref 70–99)
Potassium: 3.4 mmol/L — ABNORMAL LOW (ref 3.5–5.2)
Sodium: 142 mmol/L (ref 134–144)
Total Protein: 7.5 g/dL (ref 6.0–8.5)
eGFR: 72 mL/min/1.73

## 2024-02-20 LAB — LIPID PANEL
Chol/HDL Ratio: 4.3 ratio (ref 0.0–4.4)
Cholesterol, Total: 257 mg/dL — ABNORMAL HIGH (ref 100–199)
HDL: 60 mg/dL
LDL Chol Calc (NIH): 164 mg/dL — ABNORMAL HIGH (ref 0–99)
Triglycerides: 181 mg/dL — ABNORMAL HIGH (ref 0–149)
VLDL Cholesterol Cal: 33 mg/dL (ref 5–40)

## 2024-02-20 LAB — VITAMIN D 25 HYDROXY (VIT D DEFICIENCY, FRACTURES): Vit D, 25-Hydroxy: 25.8 ng/mL — ABNORMAL LOW (ref 30.0–100.0)

## 2024-02-22 ENCOUNTER — Other Ambulatory Visit (HOSPITAL_COMMUNITY): Payer: Self-pay

## 2024-03-02 ENCOUNTER — Ambulatory Visit: Admitting: Internal Medicine

## 2024-03-02 DIAGNOSIS — R7689 Other specified abnormal immunological findings in serum: Secondary | ICD-10-CM

## 2024-03-02 DIAGNOSIS — G8929 Other chronic pain: Secondary | ICD-10-CM

## 2024-03-23 ENCOUNTER — Ambulatory Visit

## 2024-03-28 ENCOUNTER — Encounter: Attending: Physical Medicine & Rehabilitation | Admitting: Physical Medicine & Rehabilitation

## 2024-06-24 ENCOUNTER — Ambulatory Visit: Payer: Self-pay | Admitting: Nurse Practitioner
